# Patient Record
Sex: Male | Born: 1955 | Race: White | Hispanic: No | Marital: Married | Smoking: Current every day smoker
Health system: Southern US, Community
[De-identification: ages and names within clinical notes are randomized; demographics above are authoritative.]

## PROBLEM LIST (undated history)

## (undated) DIAGNOSIS — J449 Chronic obstructive pulmonary disease, unspecified: Secondary | ICD-10-CM

## (undated) DIAGNOSIS — R0602 Shortness of breath: Secondary | ICD-10-CM

## (undated) DIAGNOSIS — R51 Headache: Secondary | ICD-10-CM

## (undated) HISTORY — PX: BACK SURGERY: SHX140

---

## 1998-04-06 ENCOUNTER — Emergency Department (HOSPITAL_COMMUNITY): Admission: EM | Admit: 1998-04-06 | Discharge: 1998-04-06 | Payer: Self-pay

## 2002-08-11 ENCOUNTER — Encounter: Payer: Self-pay | Admitting: Emergency Medicine

## 2002-08-11 ENCOUNTER — Emergency Department (HOSPITAL_COMMUNITY): Admission: EM | Admit: 2002-08-11 | Discharge: 2002-08-11 | Payer: Self-pay | Admitting: Emergency Medicine

## 2005-03-11 ENCOUNTER — Emergency Department (HOSPITAL_COMMUNITY): Admission: EM | Admit: 2005-03-11 | Discharge: 2005-03-11 | Payer: Self-pay | Admitting: Emergency Medicine

## 2010-09-06 ENCOUNTER — Emergency Department (HOSPITAL_COMMUNITY)
Admission: EM | Admit: 2010-09-06 | Discharge: 2010-09-06 | Payer: Self-pay | Source: Home / Self Care | Admitting: Emergency Medicine

## 2010-10-25 ENCOUNTER — Ambulatory Visit (HOSPITAL_COMMUNITY)
Admission: RE | Admit: 2010-10-25 | Discharge: 2010-10-25 | Payer: Self-pay | Source: Home / Self Care | Attending: Family Medicine | Admitting: Family Medicine

## 2010-10-25 LAB — BLOOD GAS, ARTERIAL
Acid-Base Excess: 2.2 mmol/L — ABNORMAL HIGH (ref 0.0–2.0)
Bicarbonate: 25.9 mEq/L — ABNORMAL HIGH (ref 20.0–24.0)
FIO2: 0.21 %
O2 Saturation: 96.3 %
Patient temperature: 37
TCO2: 22.1 mmol/L (ref 0–100)
pCO2 arterial: 38 mmHg (ref 35.0–45.0)
pH, Arterial: 7.449 (ref 7.350–7.450)
pO2, Arterial: 73.1 mmHg — ABNORMAL LOW (ref 80.0–100.0)

## 2012-07-12 ENCOUNTER — Emergency Department (HOSPITAL_BASED_OUTPATIENT_CLINIC_OR_DEPARTMENT_OTHER)
Admission: EM | Admit: 2012-07-12 | Discharge: 2012-07-12 | Disposition: A | Discharge status: Home / Self Care | Payer: Managed Care, Other (non HMO) | Attending: Emergency Medicine | Admitting: Emergency Medicine

## 2012-07-12 ENCOUNTER — Emergency Department (HOSPITAL_BASED_OUTPATIENT_CLINIC_OR_DEPARTMENT_OTHER): Payer: Managed Care, Other (non HMO)

## 2012-07-12 ENCOUNTER — Encounter (HOSPITAL_BASED_OUTPATIENT_CLINIC_OR_DEPARTMENT_OTHER): Payer: Self-pay

## 2012-07-12 DIAGNOSIS — J440 Chronic obstructive pulmonary disease with acute lower respiratory infection: Secondary | ICD-10-CM | POA: Insufficient documentation

## 2012-07-12 DIAGNOSIS — J209 Acute bronchitis, unspecified: Secondary | ICD-10-CM | POA: Insufficient documentation

## 2012-07-12 DIAGNOSIS — R51 Headache: Secondary | ICD-10-CM | POA: Insufficient documentation

## 2012-07-12 DIAGNOSIS — F17200 Nicotine dependence, unspecified, uncomplicated: Secondary | ICD-10-CM | POA: Insufficient documentation

## 2012-07-12 DIAGNOSIS — J9801 Acute bronchospasm: Secondary | ICD-10-CM

## 2012-07-12 HISTORY — DX: Chronic obstructive pulmonary disease, unspecified: J44.9

## 2012-07-12 LAB — CBC WITH DIFFERENTIAL/PLATELET
Basophils Relative: 0 % (ref 0–1)
Eosinophils Absolute: 0.1 10*3/uL (ref 0.0–0.7)
HCT: 40.2 % (ref 39.0–52.0)
Hemoglobin: 14.2 g/dL (ref 13.0–17.0)
Lymphs Abs: 1.1 10*3/uL (ref 0.7–4.0)
MCH: 32.3 pg (ref 26.0–34.0)
MCHC: 35.3 g/dL (ref 30.0–36.0)
MCV: 91.6 fL (ref 78.0–100.0)
Monocytes Absolute: 1.1 10*3/uL — ABNORMAL HIGH (ref 0.1–1.0)
Monocytes Relative: 6 % (ref 3–12)
RBC: 4.39 MIL/uL (ref 4.22–5.81)

## 2012-07-12 LAB — BASIC METABOLIC PANEL
BUN: 15 mg/dL (ref 6–23)
Creatinine, Ser: 1.1 mg/dL (ref 0.50–1.35)
GFR calc Af Amer: 85 mL/min — ABNORMAL LOW (ref >90)
GFR calc non Af Amer: 73 mL/min — ABNORMAL LOW (ref >90)
Glucose, Bld: 116 mg/dL — ABNORMAL HIGH (ref 70–99)
Potassium: 3.9 mEq/L (ref 3.5–5.1)

## 2012-07-12 MED ORDER — AZITHROMYCIN 250 MG PO TABS: ORAL_TABLET | ORAL | Status: DC

## 2012-07-12 MED ORDER — AZITHROMYCIN 250 MG PO TABS
250.0000 mg | ORAL_TABLET | Freq: Once | ORAL | Status: AC
  Administered 2012-07-12: 250 mg via ORAL
  Filled 2012-07-12: qty 1

## 2012-07-12 MED ORDER — SODIUM CHLORIDE 0.9 % IV BOLUS (SEPSIS)
1000.0000 mL | Freq: Once | INTRAVENOUS | Status: AC
  Administered 2012-07-12: 1000 mL via INTRAVENOUS

## 2012-07-12 MED ORDER — ALBUTEROL SULFATE (5 MG/ML) 0.5% IN NEBU
5.0000 mg | INHALATION_SOLUTION | RESPIRATORY_TRACT | Status: DC
  Administered 2012-07-12: 5 mg via RESPIRATORY_TRACT

## 2012-07-12 MED ORDER — ALBUTEROL SULFATE (5 MG/ML) 0.5% IN NEBU
INHALATION_SOLUTION | RESPIRATORY_TRACT | Status: AC
  Filled 2012-07-12: qty 1

## 2012-07-12 MED ORDER — IPRATROPIUM BROMIDE 0.02 % IN SOLN
RESPIRATORY_TRACT | Status: AC
  Filled 2012-07-12: qty 2.5

## 2012-07-12 MED ORDER — IPRATROPIUM BROMIDE 0.02 % IN SOLN
0.5000 mg | RESPIRATORY_TRACT | Status: DC
  Administered 2012-07-12: 0.5 mg via RESPIRATORY_TRACT

## 2012-07-12 MED ORDER — IBUPROFEN 400 MG PO TABS
600.0000 mg | ORAL_TABLET | Freq: Once | ORAL | Status: AC
  Administered 2012-07-12: 600 mg via ORAL
  Filled 2012-07-12: qty 1

## 2012-07-12 MED ORDER — PREDNISONE 20 MG PO TABS: ORAL_TABLET | ORAL | Status: DC

## 2012-07-12 NOTE — ED Provider Notes (Signed)
History     CSN: [id number]  Arrival date & time 07/12/12  1314   First MD Initiated Contact with Patient 07/12/12 1334      Chief Complaint  Patient presents with  . Cough  . Chills  . Generalized Body Aches    (Consider location/radiation/quality/duration/timing/severity/associated sxs/prior treatment) HPI Comments: 56 year old male with COPD presents the emergency department complaining of productive cough, chills and body aches for the past 3 days. He states the symptoms subsided yesterday, however this morning when he woke up they came back. He feels very congested and believes he has a fever. Has not had any alleviating factors. Admits to associated shortness of breath she's been using his albuterol for. He is feeling slightly nauseated. Denies chest pain or vomiting. Denies any sick contacts. Smokes 2 packs per day.  Patient is a 56 y.o. male presenting with cough. The history is provided by the patient.  Cough Associated symptoms include chills, headaches, myalgias and shortness of breath. Pertinent negatives include no chest pain.    Past Medical History  Diagnosis Date  . COPD (chronic obstructive pulmonary disease)     Past Surgical History  Procedure Date  . Back surgery     No family history on file.  History  Substance Use Topics  . Smoking status: Current Every Day Smoker -- 2.0 packs/day    Types: Cigarettes  . Smokeless tobacco: Never Used  . Alcohol Use: No      Review of Systems  Constitutional: Positive for fever and chills.  HENT: Positive for congestion.   Respiratory: Positive for cough, chest tightness and shortness of breath.   Cardiovascular: Negative for chest pain.  Gastrointestinal: Positive for nausea. Negative for vomiting.  Genitourinary: Negative for flank pain.  Musculoskeletal: Positive for myalgias and arthralgias.  Neurological: Positive for headaches. Negative for dizziness.  Psychiatric/Behavioral: Negative for confusion.      Allergies  Review of patient's allergies indicates no known allergies.  Home Medications  No current outpatient prescriptions on file.  BP 136/64  Pulse 77  Temp 99.3 F (37.4 C) (Oral)  Resp 20  Ht 5\' 9"  (1.753 m)  Wt 155 lb (70.308 kg)  BMI 22.89 kg/m2  SpO2 98%  Physical Exam  Constitutional: He is oriented to person, place, and time. He appears well-developed and well-nourished. No distress.  HENT:  Head: Normocephalic and atraumatic.  Right Ear: Tympanic membrane, external ear and ear canal normal.  Left Ear: Tympanic membrane, external ear and ear canal normal.  Nose: Mucosal edema present. No rhinorrhea.  Mouth/Throat: Abnormal dentition. Posterior oropharyngeal erythema present. No oropharyngeal exudate or posterior oropharyngeal edema.       Postnasal drip noted.  Eyes: Conjunctivae normal and EOM are normal.  Neck: Normal range of motion. Neck supple.  Cardiovascular: Normal rate, regular rhythm, normal heart sounds and intact distal pulses.   Pulmonary/Chest: He has wheezes (Scattered). He has rhonchi (scattered, L>R).       Labored breathing  Abdominal: Soft. Normal appearance and bowel sounds are normal. There is tenderness in the epigastric area. There is no rigidity, no rebound, no guarding and no CVA tenderness.  Musculoskeletal: Normal range of motion.  Lymphadenopathy:    He has cervical adenopathy.  Neurological: He is alert and oriented to person, place, and time.  Skin: Skin is warm and intact. No rash noted.       Very warm  Psychiatric: He has a normal mood and affect. His speech is normal and behavior is normal.  ED Course  Procedures (including critical care time)   Labs Reviewed  CBC WITH DIFFERENTIAL  BASIC METABOLIC PANEL   Results for orders placed during the hospital encounter of 07/12/12  CBC WITH DIFFERENTIAL      Component Value Range   WBC 17.0 (*) 4.0 - 10.5 K/uL   RBC 4.39  4.22 - 5.81 MIL/uL   Hemoglobin 14.2  13.0 -  17.0 g/dL   HCT 13.0  86.5 - 78.4 %   MCV 91.6  78.0 - 100.0 fL   MCH 32.3  26.0 - 34.0 pg   MCHC 35.3  30.0 - 36.0 g/dL   RDW 69.6  29.5 - 28.4 %   Platelets 243  150 - 400 K/uL   Neutrophils Relative 87 (*) 43 - 77 %   Neutro Abs 14.8 (*) 1.7 - 7.7 K/uL   Lymphocytes Relative 6 (*) 12 - 46 %   Lymphs Abs 1.1  0.7 - 4.0 K/uL   Monocytes Relative 6  3 - 12 %   Monocytes Absolute 1.1 (*) 0.1 - 1.0 K/uL   Eosinophils Relative 1  0 - 5 %   Eosinophils Absolute 0.1  0.0 - 0.7 K/uL   Basophils Relative 0  0 - 1 %   Basophils Absolute 0.0  0.0 - 0.1 K/uL  BASIC METABOLIC PANEL      Component Value Range   Sodium 135  135 - 145 mEq/L   Potassium 3.9  3.5 - 5.1 mEq/L   Chloride 98  96 - 112 mEq/L   CO2 27  19 - 32 mEq/L   Glucose, Bld 116 (*) 70 - 99 mg/dL   BUN 15  6 - 23 mg/dL   Creatinine, Ser 1.32  0.50 - 1.35 mg/dL   Calcium 9.3  8.4 - 44.0 mg/dL   GFR calc non Af Amer 73 (*) >90 mL/min   GFR calc Af Amer 85 (*) >90 mL/min    Dg Chest 2 View  07/12/2012  *RADIOLOGY REPORT*  Clinical Data: Cough, chills.  CHEST - 2 VIEW  Comparison: 09/06/2010  Findings: Heart is normal size.  Left lung is clear. Nodular density projects over the right upper lobe.  This was not seen on prior study.  No effusions.  No acute bony abnormality.  IMPRESSION: No active cardiopulmonary disease.  Nodular density projecting over the right upper lobe.  Cannot exclude pulmonary nodule.  Recommend chest CT for further evaluation.   Original Report Authenticated By: Cyndie Chime, M.D.      1. Acute bronchitis   2. Acute bronchospasm       MDM  56 y/o male with COPD presenting with cough, body aches, fever, chills. White count elevated at 17. Lungs with scattered wheezes and ronchi on exam. Duo-neb given. Patient refusing rectal temp for more accurate temperature. 2:54 PM States he is feeling less sob after duo-neb. Still wheezing. CXR without any pneumonia, however small nodule seen in right lung.  Patient was not aware of this, and he will discuss it with his PCP Dr. Lorin Picket. Will treat for bronchitis. Case discussed with Dr. Rulon Abide who agrees with plan of care.        Trevor Mace, PA-C 07/12/12 1500

## 2012-07-12 NOTE — ED Notes (Signed)
Pt reports cough, chills and generalized body aches.

## 2012-07-12 NOTE — ED Provider Notes (Signed)
Medical screening examination/treatment/procedure(s) were performed by non-physician practitioner and as supervising physician I was immediately available for consultation/collaboration.  Jones Skene, M.D.      Jones Skene, MD 07/12/12 1654

## 2012-07-12 NOTE — ED Notes (Signed)
Pt refused rectal temp.

## 2012-12-11 ENCOUNTER — Other Ambulatory Visit (HOSPITAL_COMMUNITY): Payer: Self-pay

## 2012-12-11 DIAGNOSIS — J441 Chronic obstructive pulmonary disease with (acute) exacerbation: Secondary | ICD-10-CM

## 2012-12-24 ENCOUNTER — Ambulatory Visit (HOSPITAL_COMMUNITY)
Admission: RE | Admit: 2012-12-24 | Discharge: 2012-12-24 | Disposition: A | Discharge status: Home / Self Care | Payer: Managed Care, Other (non HMO) | Source: Ambulatory Visit | Attending: Family Medicine | Admitting: Family Medicine

## 2012-12-24 DIAGNOSIS — R0989 Other specified symptoms and signs involving the circulatory and respiratory systems: Secondary | ICD-10-CM | POA: Insufficient documentation

## 2012-12-24 DIAGNOSIS — R0609 Other forms of dyspnea: Secondary | ICD-10-CM | POA: Insufficient documentation

## 2012-12-24 DIAGNOSIS — J449 Chronic obstructive pulmonary disease, unspecified: Secondary | ICD-10-CM | POA: Insufficient documentation

## 2012-12-24 DIAGNOSIS — J4489 Other specified chronic obstructive pulmonary disease: Secondary | ICD-10-CM | POA: Insufficient documentation

## 2012-12-24 MED ORDER — ALBUTEROL SULFATE (5 MG/ML) 0.5% IN NEBU
2.5000 mg | INHALATION_SOLUTION | Freq: Once | RESPIRATORY_TRACT | Status: AC
  Administered 2012-12-24: 2.5 mg via RESPIRATORY_TRACT

## 2012-12-26 LAB — PULMONARY FUNCTION TEST

## 2012-12-26 NOTE — Procedures (Signed)
Jeffrey Krueger, [name]                 ACCOUNT NO.:  [id number]  MEDICAL RECORD NO.:  [id number]  LOCATION:  RESP                          FACILITY:  [facility]  PHYSICIAN:  Mikahla Wisor L. Juanetta Gosling, M.D.DATE OF BIRTH:  [birth year]  DATE OF PROCEDURE: DATE OF DISCHARGE:  12/24/2012                           PULMONARY FUNCTION TEST   REASON FOR PULMONARY FUNCTION TESTING:  COPD.  RESULTS: 1. Spirometry shows a severe ventilatory defect with airflow     obstruction. 2. There is significant bronchodilator improvement. 3. This study is consistent with clinical diagnosis of COPD.     Jeffrey Krueger L. Juanetta Gosling, M.D.     EL[facility]/MEDQ  D:  12/25/2012  T:  12/26/2012  Job:  [id number]

## 2013-01-31 ENCOUNTER — Other Ambulatory Visit: Payer: Self-pay | Admitting: Family Medicine

## 2013-01-31 DIAGNOSIS — J449 Chronic obstructive pulmonary disease, unspecified: Secondary | ICD-10-CM

## 2013-01-31 NOTE — Progress Notes (Signed)
  Subjective:    Patient ID: Jeffrey Krueger, male    DOB: 1956/02/01, 57 y.o.   MRN: [id number]  HPI    Review of Systems     Objective:   Physical Exam        Assessment & Plan:

## 2013-02-13 ENCOUNTER — Institutional Professional Consult (permissible substitution): Payer: Managed Care, Other (non HMO) | Admitting: Critical Care Medicine

## 2013-03-08 ENCOUNTER — Other Ambulatory Visit: Payer: Self-pay | Admitting: *Deleted

## 2013-03-08 DIAGNOSIS — R911 Solitary pulmonary nodule: Secondary | ICD-10-CM

## 2013-05-28 ENCOUNTER — Ambulatory Visit (INDEPENDENT_AMBULATORY_CARE_PROVIDER_SITE_OTHER): Payer: Managed Care, Other (non HMO) | Admitting: Family Medicine

## 2013-05-28 ENCOUNTER — Encounter: Payer: Self-pay | Admitting: Family Medicine

## 2013-05-28 VITALS — BP 102/70 | Temp 98.0°F | Ht 69.0 in | Wt 146.1 lb

## 2013-05-28 DIAGNOSIS — F17200 Nicotine dependence, unspecified, uncomplicated: Secondary | ICD-10-CM

## 2013-05-28 DIAGNOSIS — J441 Chronic obstructive pulmonary disease with (acute) exacerbation: Secondary | ICD-10-CM

## 2013-05-28 DIAGNOSIS — R911 Solitary pulmonary nodule: Secondary | ICD-10-CM

## 2013-05-28 DIAGNOSIS — Z720 Tobacco use: Secondary | ICD-10-CM

## 2013-05-28 DIAGNOSIS — Z125 Encounter for screening for malignant neoplasm of prostate: Secondary | ICD-10-CM

## 2013-05-28 DIAGNOSIS — Z79899 Other long term (current) drug therapy: Secondary | ICD-10-CM

## 2013-05-28 DIAGNOSIS — E785 Hyperlipidemia, unspecified: Secondary | ICD-10-CM

## 2013-05-28 MED ORDER — ETODOLAC 300 MG PO CAPS: 300.0000 mg | ORAL_CAPSULE | Freq: Three times a day (TID) | ORAL | Status: DC | PRN

## 2013-05-28 MED ORDER — ALBUTEROL SULFATE (2.5 MG/3ML) 0.083% IN NEBU: 2.5000 mg | INHALATION_SOLUTION | RESPIRATORY_TRACT | Status: DC | PRN

## 2013-05-28 MED ORDER — ALBUTEROL SULFATE HFA 108 (90 BASE) MCG/ACT IN AERS: 2.0000 | INHALATION_SPRAY | Freq: Four times a day (QID) | RESPIRATORY_TRACT | Status: DC | PRN

## 2013-05-28 MED ORDER — TIOTROPIUM BROMIDE MONOHYDRATE 18 MCG IN CAPS: 18.0000 ug | ORAL_CAPSULE | Freq: Every day | RESPIRATORY_TRACT | Status: DC

## 2013-05-28 MED ORDER — VALACYCLOVIR HCL 1 G PO TABS: ORAL_TABLET | ORAL | Status: AC

## 2013-05-28 NOTE — Progress Notes (Signed)
  Subjective:    Patient ID: Jeffrey Krueger, male    DOB: [birth year], 57 y.o.   MRN: [id number]  HPI Patient is being seen today because he thinks he has an outbreak of shingles on the lower part of his back. It started about a week ago. Causes some discomfort  He does have a history of a solitary pulmonary nodule for which he needs a followup CT scan. We have tried numerous times to get a hold of him including sending him a certified letter without any results he was talked to at length today and he agrees to having the test  Patient also complains of congestion and cough that has been going on for about 3 weeks now. Patient is mainly having a flareup of his COPD he ran out of his medications. No fever no mucoid drainage. No blood.  PMH COPD tobacco abuse Review of Systems Shortness of breath denies chest pain denies weight loss appetite good denies headaches depression    Objective:   Physical Exam Bilateral expiratory wheezes heart regular pulse normal abdomen soft extremities no edema       Assessment & Plan:  Wellness lab work ordered COPD refills on medications given hopefully patient will improve dramatically with restarting his medicines Solitary pulmonary nodule CT ordered. Await results Patient will followup again in 3 months time

## 2013-06-12 ENCOUNTER — Emergency Department (HOSPITAL_BASED_OUTPATIENT_CLINIC_OR_DEPARTMENT_OTHER)
Admission: EM | Admit: 2013-06-12 | Discharge: 2013-06-12 | Disposition: A | Discharge status: Home / Self Care | Payer: Managed Care, Other (non HMO) | Attending: Emergency Medicine | Admitting: Emergency Medicine

## 2013-06-12 ENCOUNTER — Emergency Department (HOSPITAL_BASED_OUTPATIENT_CLINIC_OR_DEPARTMENT_OTHER): Payer: Managed Care, Other (non HMO)

## 2013-06-12 ENCOUNTER — Encounter (HOSPITAL_BASED_OUTPATIENT_CLINIC_OR_DEPARTMENT_OTHER): Payer: Self-pay | Admitting: *Deleted

## 2013-06-12 DIAGNOSIS — J449 Chronic obstructive pulmonary disease, unspecified: Secondary | ICD-10-CM

## 2013-06-12 DIAGNOSIS — J441 Chronic obstructive pulmonary disease with (acute) exacerbation: Secondary | ICD-10-CM | POA: Insufficient documentation

## 2013-06-12 DIAGNOSIS — Z79899 Other long term (current) drug therapy: Secondary | ICD-10-CM | POA: Insufficient documentation

## 2013-06-12 DIAGNOSIS — R079 Chest pain, unspecified: Secondary | ICD-10-CM | POA: Insufficient documentation

## 2013-06-12 DIAGNOSIS — F17200 Nicotine dependence, unspecified, uncomplicated: Secondary | ICD-10-CM | POA: Insufficient documentation

## 2013-06-12 LAB — COMPREHENSIVE METABOLIC PANEL
ALT: 22 U/L (ref 0–53)
AST: 18 U/L (ref 0–37)
Albumin: 4 g/dL (ref 3.5–5.2)
Calcium: 10 mg/dL (ref 8.4–10.5)
GFR calc Af Amer: 84 mL/min — ABNORMAL LOW (ref >90)
Glucose, Bld: 89 mg/dL (ref 70–99)
Potassium: 4 mEq/L (ref 3.5–5.1)
Sodium: 139 mEq/L (ref 135–145)
Total Protein: 6.8 g/dL (ref 6.0–8.3)

## 2013-06-12 LAB — CBC WITH DIFFERENTIAL/PLATELET
Basophils Absolute: 0.1 10*3/uL (ref 0.0–0.1)
Basophils Relative: 0 % (ref 0–1)
Eosinophils Absolute: 0.9 10*3/uL — ABNORMAL HIGH (ref 0.0–0.7)
Eosinophils Relative: 6 % — ABNORMAL HIGH (ref 0–5)
MCH: 32.4 pg (ref 26.0–34.0)
MCHC: 34.6 g/dL (ref 30.0–36.0)
MCV: 93.5 fL (ref 78.0–100.0)
Neutrophils Relative %: 68 % (ref 43–77)
Platelets: 256 10*3/uL (ref 150–400)
RDW: 12.4 % (ref 11.5–15.5)

## 2013-06-12 MED ORDER — IPRATROPIUM BROMIDE 0.02 % IN SOLN
0.5000 mg | Freq: Once | RESPIRATORY_TRACT | Status: AC
  Administered 2013-06-12: 0.5 mg via RESPIRATORY_TRACT
  Filled 2013-06-12: qty 2.5

## 2013-06-12 MED ORDER — PREDNISONE 10 MG PO TABS: ORAL_TABLET | ORAL | Status: DC

## 2013-06-12 MED ORDER — ALBUTEROL SULFATE (5 MG/ML) 0.5% IN NEBU
5.0000 mg | INHALATION_SOLUTION | Freq: Once | RESPIRATORY_TRACT | Status: AC
  Administered 2013-06-12: 5 mg via RESPIRATORY_TRACT
  Filled 2013-06-12: qty 1

## 2013-06-12 MED ORDER — METHYLPREDNISOLONE SODIUM SUCC 125 MG IJ SOLR
125.0000 mg | Freq: Once | INTRAMUSCULAR | Status: AC
  Administered 2013-06-12: 125 mg via INTRAVENOUS
  Filled 2013-06-12: qty 2

## 2013-06-12 MED ORDER — AZITHROMYCIN 250 MG PO TABS: 250.0000 mg | ORAL_TABLET | Freq: Every day | ORAL | Status: DC

## 2013-06-12 NOTE — ED Provider Notes (Signed)
CSN: [id number]     Arrival date & time 06/12/13  1358 History   None    Chief Complaint  Patient presents with  . Shortness of Breath   (Consider location/radiation/quality/duration/timing/severity/associated sxs/prior Treatment) Patient is a 57 y.o. male presenting with shortness of breath. The history is provided by the patient. No language interpreter was used.  Shortness of Breath Severity:  Moderate Onset quality:  Gradual Duration:  1 month Timing:  Constant Progression:  Worsening Chronicity:  New Relieved by:  Nothing Worsened by:  Nothing tried Ineffective treatments:  None tried Associated symptoms: chest pain and cough   Risk factors comment:  Smoker   Past Medical History  Diagnosis Date  . COPD (chronic obstructive pulmonary disease)    Past Surgical History  Procedure Laterality Date  . Back surgery     No family history on file. History  Substance Use Topics  . Smoking status: Current Every Day Smoker -- 2.00 packs/day    Types: Cigarettes  . Smokeless tobacco: Never Used  . Alcohol Use: No    Review of Systems  Respiratory: Positive for cough and shortness of breath.   Cardiovascular: Positive for chest pain.  All other systems reviewed and are negative.    Allergies  Review of patient's allergies indicates no known allergies.  Home Medications   Current Outpatient Rx  Name  Route  Sig  Dispense  Refill  . albuterol (PROVENTIL HFA;VENTOLIN HFA) 108 (90 BASE) MCG/ACT inhaler   Inhalation   Inhale 2 puffs into the lungs every 6 (six) hours as needed for wheezing.   1 Inhaler   6   . albuterol (PROVENTIL) (2.5 MG/3ML) 0.083% nebulizer solution   Nebulization   Take 3 mLs (2.5 mg total) by nebulization every 4 (four) hours as needed for wheezing.   150 mL   12     Please give 2 boxes of the vials   . etodolac (LODINE) 300 MG capsule   Oral   Take 1 capsule (300 mg total) by mouth 3 (three) times daily as needed.   45 capsule   6    . tiotropium (SPIRIVA HANDIHALER) 18 MCG inhalation capsule   Inhalation   Place 1 capsule (18 mcg total) into inhaler and inhale daily.   30 capsule   12    BP 139/101  Temp(Src) 98.2 F (36.8 C) (Oral)  Resp 39  Ht 5\' 9"  (1.753 m)  Wt 148 lb (67.132 kg)  BMI 21.85 kg/m2  SpO2 96% Physical Exam  Nursing note and vitals reviewed. Constitutional: He appears well-developed and well-nourished.  HENT:  Head: Normocephalic.  Right Ear: External ear normal.  Left Ear: External ear normal.  Nose: Nose normal.  Mouth/Throat: Oropharynx is clear and moist.  Eyes: Conjunctivae and EOM are normal. Pupils are equal, round, and reactive to light.  Neck: Normal range of motion. Neck supple.  Cardiovascular: Normal rate.   Pulmonary/Chest: He has wheezes.  Abdominal: Soft.  Musculoskeletal: Normal range of motion.  Neurological: He is alert.  Skin: Skin is warm.  Psychiatric: He has a normal mood and affect.    ED Course  Procedures (including critical care time) Labs Review Labs Reviewed - No data to display Imaging Review Dg Chest 2 View  06/12/2013   *RADIOLOGY REPORT*  Clinical Data: Cough and shortness of breath, history of smoking, initial encounter.  CHEST - 2 VIEW  Comparison: 07/12/2012; 09/06/2010  Findings: Grossly unchanged cardiac silhouette and mediastinal contours with prominence of the  central pulmonary vasculature.  The lungs remain hyperexpanded with flattening of the hemidiaphragms and mild diffuse slightly nodular thickening of the interstitium. An approximately 7 mm nodular opacity overlying the peripheral aspect of the right upper lung appears unchanged to minimally increased interval.  Symmetric opacities overlying the bilateral lower lungs are favored to represent nipple shadows.  No focal airspace opacities.  No pleural effusion or pneumothorax.  No evidence of edema.  Unchanged bones.  IMPRESSION: 1.  Approximately 7 mm nodular opacity overlying the peripheral  aspect of the right mid lung appears unchanged to minimally increased in size.  Given marked emphysematous change, further evaluation with chest CT is recommended. 2.  Additional grossly symmetric nodular opacities overlying the bilateral mid lungs are favored to represent nipple shadows. Again, attention on recommended chest CT is recommended. 3.  Marked hyperexpansion without definite acute cardiopulmonary disease.  This was made a call report.   Original Report Authenticated By: Tacey Ruiz, MD   Ct Chest Wo Contrast  06/12/2013   *RADIOLOGY REPORT*  Clinical Data: Cough, possible pulmonary nodules.  CT CHEST WITHOUT CONTRAST  Technique:  Multidetector CT imaging of the chest was performed following the standard protocol without IV contrast.  Comparison: Chest radiograph of same date.  Findings: Hyperexpansion of the lungs is noted.  No evidence of mediastinal mass or adenopathy is noted.  Visualized portion of upper abdomen appears normal.  5 mm nodule is noted in the anterior and lateral portion of the right upper lobe.  Faint irregular densities are seen in the the lateral costophrenic sulcus bilaterally most consistent with scarring.  No pneumonia or significant atelectasis is noted.  No pneumothorax or pleural effusion is noted.  IMPRESSION: Hyperexpansion of the lungs is noted suggesting chronic obstructive pulmonary disease.  5 mm nodule is noted anteriorly in the If the patient is at high risk for bronchogenic carcinoma, follow-up chest CT at 6-12 months is recommended.  If the patient is at low risk for bronchogenic carcinoma, follow-up chest CT at 12 months is recommended.  This recommendation follows the consensus statement: Guidelines for Management of Small Pulmonary Nodules Detected on CT Scans: A Statement from the Fleischner Society as published in Radiology 2005; 237:395-400.   right upper lobe;   Original Report Authenticated By: Lupita Raider.,  M.D.    MDM   1. COPD (chronic  obstructive pulmonary disease)    Chest xray shows nodule.  Radiologist recommends Ct scan.   Pt given albuterol and Atrovent neb.  Pt given solumedrol IV.   Pt counseled on Ct.  I advised him to see Dr. Gerda Diss for recheck in 1 week.   Pt needs follow up on nodule and copd,   Pt advised to stop smoking.  Pt given rx for prednisone taper and zithromax    Elson Areas, PA-C 06/12/13 1950

## 2013-06-12 NOTE — ED Notes (Signed)
PA at bedside discussing test results and plan of care for DC.

## 2013-06-12 NOTE — ED Notes (Signed)
Patient has hx of copd.  States for the last one month, he has been sob. States his PCP scheduled him for a CT scan, but did not have the co pay money so he cancelled it.  Productive cough with small amounts of yellow secretions.

## 2013-06-13 NOTE — ED Provider Notes (Signed)
Medical screening examination/treatment/procedure(s) were performed by non-physician practitioner and as supervising physician I was immediately available for consultation/collaboration.   Malu Pellegrini, MD 06/13/13 0729 

## 2013-06-18 ENCOUNTER — Ambulatory Visit (INDEPENDENT_AMBULATORY_CARE_PROVIDER_SITE_OTHER): Payer: Managed Care, Other (non HMO) | Admitting: Family Medicine

## 2013-06-18 ENCOUNTER — Encounter: Payer: Self-pay | Admitting: Family Medicine

## 2013-06-18 VITALS — BP 126/70 | Ht 69.0 in | Wt 146.0 lb

## 2013-06-18 DIAGNOSIS — F17200 Nicotine dependence, unspecified, uncomplicated: Secondary | ICD-10-CM

## 2013-06-18 DIAGNOSIS — J441 Chronic obstructive pulmonary disease with (acute) exacerbation: Secondary | ICD-10-CM

## 2013-06-18 DIAGNOSIS — R911 Solitary pulmonary nodule: Secondary | ICD-10-CM

## 2013-06-18 DIAGNOSIS — Z720 Tobacco use: Secondary | ICD-10-CM

## 2013-06-18 LAB — LIPID PANEL
Cholesterol: 152 mg/dL (ref 0–200)
HDL: 55 mg/dL (ref >39)
LDL Cholesterol: 79 mg/dL (ref 0–99)
Triglycerides: 89 mg/dL (ref <150)

## 2013-06-18 LAB — BASIC METABOLIC PANEL
BUN: 17 mg/dL (ref 6–23)
CO2: 35 mEq/L — ABNORMAL HIGH (ref 19–32)
Calcium: 9.7 mg/dL (ref 8.4–10.5)
Chloride: 101 mEq/L (ref 96–112)
Creat: 1.17 mg/dL (ref 0.50–1.35)

## 2013-06-18 MED ORDER — METHYLPREDNISOLONE ACETATE 40 MG/ML IJ SUSP
40.0000 mg | Freq: Once | INTRAMUSCULAR | Status: AC
  Administered 2013-06-18: 40 mg via INTRAMUSCULAR

## 2013-06-18 MED ORDER — BUDESONIDE-FORMOTEROL FUMARATE 160-4.5 MCG/ACT IN AERO: 2.0000 | INHALATION_SPRAY | Freq: Two times a day (BID) | RESPIRATORY_TRACT | Status: DC

## 2013-06-18 MED ORDER — PREDNISONE 20 MG PO TABS: ORAL_TABLET | ORAL | Status: DC

## 2013-06-18 NOTE — Progress Notes (Signed)
  Subjective:    Patient ID: Jeffrey Krueger, male    DOB: April 08, 1956, 57 y.o.   MRN: [id number]  HPI Patient is here today for a ER follow up visit for COPD exacerbation. Patient was seen at a [facility] facility on last Friday. He has completed his medications that he was prescribed. Patient states that xrays were done also at the facility.  25-30 minutes was spent with patient discussing importance of quitting smoking the importance of treating COPD and treatment options. Questions were answered in detail regarding medications medication combinations and referral processes. Patient questions were also answered regarding pulmonary nodule PMH severe COPD tobacco abuse pulmonary nodule Family history noncontributory Review of Systems    patient with dyspnea with minimal activity. Coughs up clear phlegm not bloody. Tries to eat properly has difficult time maintaining his weight. Objective:   Physical Exam His lungs show some intermittent bilateral wheezing patient does appear slightly short of breath but he states that this is his baseline his heart is regular abdomen soft extremities no edema skin warm dry neurologic grossly normal.       Assessment & Plan:  #1 COPD/reactive airway-continue albuterol frequent basis, Symbicort twice daily, spur Riva daily, recommend referral to pulmonology at [facility] one day he may qualify for lung transplant. Pulmonary function tests were done in April I do not feel he needs an additional 1 #2 pulmonary nodule repeat CT scan in 6 months time #3 patient was encouraged to quit smoking he will try nicotine patches. Significant time spent discussing this. 25-30 minutes spent with patient

## 2013-06-19 ENCOUNTER — Encounter: Payer: Self-pay | Admitting: Family Medicine

## 2013-06-30 ENCOUNTER — Telehealth: Payer: Self-pay | Admitting: Family Medicine

## 2013-06-30 MED ORDER — ALPRAZOLAM 1 MG PO TABS: ORAL_TABLET | ORAL | Status: DC

## 2013-06-30 MED ORDER — PREDNISONE 20 MG PO TABS: ORAL_TABLET | ORAL | Status: DC

## 2013-06-30 NOTE — Telephone Encounter (Signed)
Medications was sent to pharmacy. Camelia Eng was notified.

## 2013-06-30 NOTE — Telephone Encounter (Signed)
Nurse to call, clarify what the patient needs. Certainly if needing a prednisone taper we can do that. Patient has severe COPD. Xanax use is reasonable. Please discuss with patient what he needs then we can call in

## 2013-06-30 NOTE — Telephone Encounter (Signed)
Refill on Xanax x2, prednisone taper 20 mg. 3 tablets daily for 2 days, then 2 tablets daily for 2 days, then one tablet daily for 2 days. That is 12 tablets total, followup if ongoing troubles

## 2013-06-30 NOTE — Telephone Encounter (Signed)
Patient needs Rx for prednisone and xanax     [facility]

## 2013-06-30 NOTE — Telephone Encounter (Signed)
Patient called to check on this - I told him that we were going to call in for patient, but nurse had not had the time to finish yet. Would like a call back when called in.

## 2013-06-30 NOTE — Telephone Encounter (Signed)
Jeffrey Krueger states that patient has SOB and prednisone helps. Needs refill on xanax.

## 2013-07-23 ENCOUNTER — Telehealth: Payer: Self-pay | Admitting: Family Medicine

## 2013-07-23 NOTE — Telephone Encounter (Signed)
Patient needs refill for prednisone   [facility]

## 2013-07-23 NOTE — Telephone Encounter (Signed)
May refill once. 

## 2013-07-23 NOTE — Telephone Encounter (Signed)
Last filled 06/30/13. 

## 2013-07-24 ENCOUNTER — Encounter: Payer: Self-pay | Admitting: Family Medicine

## 2013-07-24 MED ORDER — PREDNISONE 20 MG PO TABS: ORAL_TABLET | ORAL | Status: DC

## 2013-07-24 NOTE — Telephone Encounter (Signed)
Rx sent electronically to [facility] [place]

## 2013-09-05 ENCOUNTER — Ambulatory Visit (INDEPENDENT_AMBULATORY_CARE_PROVIDER_SITE_OTHER): Payer: Managed Care, Other (non HMO) | Admitting: Family Medicine

## 2013-09-05 ENCOUNTER — Encounter: Payer: Self-pay | Admitting: Family Medicine

## 2013-09-05 ENCOUNTER — Other Ambulatory Visit: Payer: Self-pay | Admitting: *Deleted

## 2013-09-05 VITALS — BP 128/80 | Temp 98.1°F | Ht 69.0 in | Wt 152.0 lb

## 2013-09-05 DIAGNOSIS — Z23 Encounter for immunization: Secondary | ICD-10-CM

## 2013-09-05 DIAGNOSIS — M5431 Sciatica, right side: Secondary | ICD-10-CM | POA: Insufficient documentation

## 2013-09-05 DIAGNOSIS — J441 Chronic obstructive pulmonary disease with (acute) exacerbation: Secondary | ICD-10-CM

## 2013-09-05 DIAGNOSIS — M5430 Sciatica, unspecified side: Secondary | ICD-10-CM

## 2013-09-05 MED ORDER — ALPRAZOLAM 1 MG PO TABS: ORAL_TABLET | ORAL | Status: DC

## 2013-09-05 MED ORDER — HYDROCODONE-ACETAMINOPHEN 5-325 MG PO TABS: 1.0000 | ORAL_TABLET | Freq: Four times a day (QID) | ORAL | Status: DC | PRN

## 2013-09-05 NOTE — Progress Notes (Signed)
   Subjective:    Patient ID: Jeffrey Krueger, male    DOB: 09-07-1956, 57 y.o.   MRN: [id number]  HPIFollow up on COPD. No concerns. Declines flu vaccine. Needs refill on xanax and inhaler.  Discuss letter he received about symbicort. COPD fair but he gets short of breath with Maisie Fus everything he does he still smokes a pack a day he's been counseled that he truly needs to quit this altogether patient understands the importance of quitting. He states he will try harder in quitting.  Patient has anxiety issues he uses Xanax sparingly to help with this. PMH COPD, pulmonary nodule  Cough for 1 week. Requesting rx for prednisone. Upon further discussion he states he uses a prednisone for body aches I told him that that was inappropriate it would be better to try hydrocodone when necessary.  Review of Systems  Constitutional: Negative for fever and activity change.  HENT: Negative for congestion, ear pain and rhinorrhea.   Eyes: Negative for discharge.  Respiratory: Positive for cough. Negative for wheezing.   Cardiovascular: Negative for chest pain.       Objective:   Physical Exam  Nursing note and vitals reviewed. Constitutional: He appears well-developed.  HENT:  Head: Normocephalic.  Mouth/Throat: Oropharynx is clear and moist. No oropharyngeal exudate.  Neck: Normal range of motion.  Cardiovascular: Normal rate, regular rhythm and normal heart sounds.   No murmur heard. Pulmonary/Chest: Effort normal and breath sounds normal. He has no wheezes.  Lymphadenopathy:    He has no cervical adenopathy.  Neurological: He exhibits normal muscle tone.  Skin: Skin is warm and dry.          Assessment & Plan:  COPD-patient needs insurance approval for his Symbicort. We will try to help get this. It along with Spiriva is very important to get his condition under control. He uses albuterol as a rescue inhaler patient was encouraged for prednisone to be used infrequently  Heart beat in  the ear-she does not having severe headaches with this it is not unilateral. He is a thin person. I think he is just hearing the transmission of his heartbeat I don't feel he has any aneurysm. He gets occasional headaches but no vomiting.  R leg sciatica- hydrocodne-patient has intermittent sciatica on the right side for which he uses hydrocodone infrequently.  Patient does state he gets anxious a lot and uses Xanax for this he denies abusing it.  Pneumonia vaccine today  Will need CT exam from March or April

## 2013-09-20 ENCOUNTER — Encounter (HOSPITAL_BASED_OUTPATIENT_CLINIC_OR_DEPARTMENT_OTHER): Payer: Self-pay | Admitting: Emergency Medicine

## 2013-09-20 ENCOUNTER — Inpatient Hospital Stay (HOSPITAL_BASED_OUTPATIENT_CLINIC_OR_DEPARTMENT_OTHER)
Admission: EM | Admit: 2013-09-20 | Discharge: 2013-09-22 | DRG: 192 | Disposition: A | Discharge status: Home / Self Care | Payer: Managed Care, Other (non HMO) | Attending: Family Medicine | Admitting: Family Medicine

## 2013-09-20 ENCOUNTER — Emergency Department (HOSPITAL_BASED_OUTPATIENT_CLINIC_OR_DEPARTMENT_OTHER): Payer: Managed Care, Other (non HMO)

## 2013-09-20 DIAGNOSIS — Z720 Tobacco use: Secondary | ICD-10-CM | POA: Diagnosis present

## 2013-09-20 DIAGNOSIS — J209 Acute bronchitis, unspecified: Principal | ICD-10-CM | POA: Diagnosis present

## 2013-09-20 DIAGNOSIS — IMO0001 Reserved for inherently not codable concepts without codable children: Secondary | ICD-10-CM

## 2013-09-20 DIAGNOSIS — J440 Chronic obstructive pulmonary disease with acute lower respiratory infection: Principal | ICD-10-CM | POA: Diagnosis present

## 2013-09-20 DIAGNOSIS — J111 Influenza due to unidentified influenza virus with other respiratory manifestations: Secondary | ICD-10-CM | POA: Diagnosis present

## 2013-09-20 DIAGNOSIS — J449 Chronic obstructive pulmonary disease, unspecified: Secondary | ICD-10-CM | POA: Diagnosis present

## 2013-09-20 DIAGNOSIS — R0603 Acute respiratory distress: Secondary | ICD-10-CM

## 2013-09-20 DIAGNOSIS — B349 Viral infection, unspecified: Secondary | ICD-10-CM

## 2013-09-20 DIAGNOSIS — F17200 Nicotine dependence, unspecified, uncomplicated: Secondary | ICD-10-CM | POA: Diagnosis present

## 2013-09-20 DIAGNOSIS — J441 Chronic obstructive pulmonary disease with (acute) exacerbation: Secondary | ICD-10-CM | POA: Diagnosis present

## 2013-09-20 HISTORY — DX: Headache: R51

## 2013-09-20 HISTORY — DX: Shortness of breath: R06.02

## 2013-09-20 LAB — CBC WITH DIFFERENTIAL/PLATELET
Basophils Absolute: 0 10*3/uL (ref 0.0–0.1)
Basophils Relative: 0 % (ref 0–1)
Eosinophils Absolute: 0.1 10*3/uL (ref 0.0–0.7)
Eosinophils Relative: 1 % (ref 0–5)
HCT: 44.3 % (ref 39.0–52.0)
Hemoglobin: 14.9 g/dL (ref 13.0–17.0)
Lymphocytes Relative: 11 % — ABNORMAL LOW (ref 12–46)
Lymphs Abs: 0.9 10*3/uL (ref 0.7–4.0)
MCH: 32.5 pg (ref 26.0–34.0)
MCHC: 33.6 g/dL (ref 30.0–36.0)
MCV: 96.7 fL (ref 78.0–100.0)
Monocytes Absolute: 1.2 10*3/uL — ABNORMAL HIGH (ref 0.1–1.0)
Monocytes Relative: 14 % — ABNORMAL HIGH (ref 3–12)
Neutro Abs: 6.4 K/uL (ref 1.7–7.7)
Neutrophils Relative %: 75 % (ref 43–77)
Platelets: 209 10*3/uL (ref 150–400)
RBC: 4.58 MIL/uL (ref 4.22–5.81)
RDW: 12.4 % (ref 11.5–15.5)
WBC: 8.6 K/uL (ref 4.0–10.5)

## 2013-09-20 LAB — BASIC METABOLIC PANEL
Calcium: 9.1 mg/dL (ref 8.4–10.5)
Creatinine, Ser: 1.1 mg/dL (ref 0.50–1.35)
GFR calc Af Amer: 84 mL/min — ABNORMAL LOW (ref >90)
GFR calc non Af Amer: 73 mL/min — ABNORMAL LOW (ref >90)
Glucose, Bld: 143 mg/dL — ABNORMAL HIGH (ref 70–99)
Sodium: 140 mEq/L (ref 135–145)

## 2013-09-20 LAB — TROPONIN I: Troponin I: <0.3 ng/mL (ref <0.30)

## 2013-09-20 LAB — BASIC METABOLIC PANEL WITH GFR
BUN: 23 mg/dL (ref 6–23)
CO2: 26 meq/L (ref 19–32)
Chloride: 101 meq/L (ref 96–112)
Potassium: 3.9 meq/L (ref 3.5–5.1)

## 2013-09-20 MED ORDER — MORPHINE SULFATE 4 MG/ML IJ SOLN
4.0000 mg | Freq: Once | INTRAMUSCULAR | Status: AC
  Administered 2013-09-20: 4 mg via INTRAVENOUS
  Filled 2013-09-20: qty 1

## 2013-09-20 MED ORDER — ALBUTEROL SULFATE (5 MG/ML) 0.5% IN NEBU
5.0000 mg | INHALATION_SOLUTION | Freq: Once | RESPIRATORY_TRACT | Status: AC
  Administered 2013-09-20: 5 mg via RESPIRATORY_TRACT

## 2013-09-20 MED ORDER — ALBUTEROL SULFATE (5 MG/ML) 0.5% IN NEBU
2.5000 mg | INHALATION_SOLUTION | RESPIRATORY_TRACT | Status: DC
  Filled 2013-09-20: qty 0.5

## 2013-09-20 MED ORDER — METHYLPREDNISOLONE SODIUM SUCC 125 MG IJ SOLR
125.0000 mg | Freq: Once | INTRAMUSCULAR | Status: AC
  Administered 2013-09-20: 125 mg via INTRAVENOUS
  Filled 2013-09-20: qty 2

## 2013-09-20 MED ORDER — IPRATROPIUM BROMIDE 0.02 % IN SOLN
RESPIRATORY_TRACT | Status: AC
  Administered 2013-09-20: 0.5 mg via RESPIRATORY_TRACT
  Filled 2013-09-20: qty 2.5

## 2013-09-20 MED ORDER — ALBUTEROL (5 MG/ML) CONTINUOUS INHALATION SOLN
INHALATION_SOLUTION | RESPIRATORY_TRACT | Status: AC
  Administered 2013-09-20: 5 mg via RESPIRATORY_TRACT
  Filled 2013-09-20: qty 20

## 2013-09-20 MED ORDER — ALBUTEROL (5 MG/ML) CONTINUOUS INHALATION SOLN
10.0000 mg/h | INHALATION_SOLUTION | Freq: Once | RESPIRATORY_TRACT | Status: AC
  Administered 2013-09-20: 10 mg/h via RESPIRATORY_TRACT
  Filled 2013-09-20: qty 20

## 2013-09-20 MED ORDER — LORAZEPAM 1 MG PO TABS
1.0000 mg | ORAL_TABLET | Freq: Once | ORAL | Status: AC
  Administered 2013-09-20: 1 mg via ORAL
  Filled 2013-09-20: qty 1

## 2013-09-20 MED ORDER — IPRATROPIUM BROMIDE 0.02 % IN SOLN
0.5000 mg | Freq: Once | RESPIRATORY_TRACT | Status: AC
  Administered 2013-09-20: 0.5 mg via RESPIRATORY_TRACT

## 2013-09-20 MED ORDER — ONDANSETRON HCL 4 MG/2ML IJ SOLN
4.0000 mg | Freq: Once | INTRAMUSCULAR | Status: AC
  Administered 2013-09-20: 4 mg via INTRAVENOUS
  Filled 2013-09-20: qty 2

## 2013-09-20 MED ORDER — SODIUM CHLORIDE 0.9 % IV SOLN: INTRAVENOUS | Status: DC

## 2013-09-20 MED ORDER — FENTANYL CITRATE 0.05 MG/ML IJ SOLN
50.0000 ug | Freq: Once | INTRAMUSCULAR | Status: AC
  Administered 2013-09-20: 50 ug via INTRAVENOUS
  Filled 2013-09-20: qty 2

## 2013-09-20 NOTE — ED Notes (Signed)
Taken directly to room 5 from registration. RT and primary RN at bedside

## 2013-09-20 NOTE — ED Provider Notes (Signed)
CSN: [id number]     Arrival date & time 09/20/13  1751 History  This chart was scribed for Gavin Pound. Oletta Lamas, MD by Dorothey Baseman, ED Scribe. This patient was seen in room MH05/MH05 and the patient's care was started at 6:23 PM.    Chief Complaint  Patient presents with  . Shortness of Breath   The history is provided by the patient. No language interpreter was used.   HPI Comments: Jeffrey Krueger is a 57 y.o. Male with a history of COPD who presents to the Emergency Department complaining of shortness of breath with associated wheezes and chest tightness, described as a cramping sensation, onset PTA. He reports some associated subjective fever, mild headache, chills, and diffuse myalgias for the past 2 days. He reports taking ibuprofen at home without significant relief. Patient was put on oxygen upon arrival to the ED, but states that he is not on at-home oxygen. He reports that he has recently been exposed to sick contacts at home. Patient reports that he received a pneumonia vaccination this year. Patient is a current, every day smoker, 2 PPD. Patient has no other pertinent medical history.   PCP- Dr. Lilyan Punt  Past Medical History  Diagnosis Date  . COPD (chronic obstructive pulmonary disease)    Past Surgical History  Procedure Laterality Date  . Back surgery     History reviewed. No pertinent family history. History  Substance Use Topics  . Smoking status: Current Every Day Smoker -- 2.00 packs/day    Types: Cigarettes  . Smokeless tobacco: Never Used  . Alcohol Use: No    Review of Systems  A complete 10 system review of systems was obtained and all systems are negative except as noted in the HPI and PMH.   Allergies  Review of patient's allergies indicates no known allergies.  Home Medications   Current Outpatient Rx  Name  Route  Sig  Dispense  Refill  . albuterol (PROVENTIL HFA;VENTOLIN HFA) 108 (90 BASE) MCG/ACT inhaler   Inhalation   Inhale 2 puffs into the  lungs every 6 (six) hours as needed for wheezing.   1 Inhaler   6   . albuterol (PROVENTIL) (2.5 MG/3ML) 0.083% nebulizer solution   Nebulization   Take 3 mLs (2.5 mg total) by nebulization every 4 (four) hours as needed for wheezing.   150 mL   12     Please give 2 boxes of the vials   . ALPRAZolam (XANAX) 1 MG tablet      Take 1/2-1 tablet qhs prn   30 tablet   3   . ALPRAZolam (XANAX) 1 MG tablet      Take 1/2-1 tablet qhs prn   30 tablet   3   . budesonide-formoterol (SYMBICORT) 160-4.5 MCG/ACT inhaler   Inhalation   Inhale 2 puffs into the lungs 2 (two) times daily.   1 Inhaler   12   . HYDROcodone-acetaminophen (NORCO/VICODIN) 5-325 MG per tablet   Oral   Take 1 tablet by mouth every 6 (six) hours as needed.   30 tablet   0     Use sparingly   . tiotropium (SPIRIVA HANDIHALER) 18 MCG inhalation capsule   Inhalation   Place 1 capsule (18 mcg total) into inhaler and inhale daily.   30 capsule   12    Triage Vitals: Pulse 123  Resp 24  SpO2 99%  Physical Exam  Nursing note and vitals reviewed. Constitutional: He is oriented to person, place,  and time. He appears well-developed and well-nourished. No distress.  HENT:  Head: Normocephalic and atraumatic.  Eyes: Conjunctivae are normal.  Neck: Normal range of motion. Neck supple.  Cardiovascular: Normal rate, regular rhythm, normal heart sounds and intact distal pulses.   No murmur heard. Pulmonary/Chest: He is in respiratory distress. He has wheezes.  Labored respirations. Tachypneic. Decreased air movement on the right. Diffuse expiratory wheezes on the left. Accessory muscle use.   Abdominal: He exhibits no distension.  Musculoskeletal: Normal range of motion.  Neurological: He is alert and oriented to person, place, and time.  Skin: Skin is warm and dry.  Psychiatric: He has a normal mood and affect. His behavior is normal.    ED Course  Procedures (including critical care time)   CRITICAL  CARE Performed by: Lear Ng. Total critical care time: 30 min Critical care time was exclusive of separately billable procedures and treating other patients. Critical care was necessary to treat or prevent imminent or life-threatening deterioration. Critical care was time spent personally by me on the following activities: development of treatment plan with patient and/or surrogate as well as nursing, discussions with consultants, evaluation of patient's response to treatment, examination of patient, obtaining history from patient or surrogate, ordering and performing treatments and interventions, ordering and review of laboratory studies, ordering and review of radiographic studies, pulse oximetry and re-evaluation of patient's condition.   DIAGNOSTIC STUDIES: Oxygen Saturation is 99% on nasal cannulation, normal by my interpretation.    COORDINATION OF CARE: 6:25 PM- Ordered Proventil/Atrovent nebulizer. Will order a chest x-ray to rule out pneumonia. Will order blood labs. Will order Solu-medrol, an additional nebulizer treatment, and Sublimaze to manage symptoms. Discussed that the patient may need to be admitted if his symptoms do not improve after treatment. Discussed treatment plan with patient at bedside and patient verbalized agreement.   7:13 PM- Patient reports that his symptoms have somewhat improved since receiving the medications. Patient is less tachypneic.  Discussed treatment plan with patient at bedside and patient verbalized agreement.   8:40 PM- Patient reports that his breathing feels better, but that the cramping sensation in his abdomen has persisted even after receiving the medications. Discussed that x-ray and lab results were normal. Less work of breathing and expiratory wheezes are much more diffuse at this time. Will order morphine to better manage his pain symptoms. Will walk the patient and monitor his pulse ox when he is not on oxygen. Discussed treatment plan  with patient at bedside and patient verbalized agreement.   9:11 PM- Patient reports feeling much better after receiving the morphine, but was unable to stand up without becoming extremely short of breath when he was taking off of the oxygen. Discussed that the patient will be admitted to the hospital. Discussed treatment plan with patient at bedside and patient verbalized agreement.   Labs Review Labs Reviewed  CBC WITH DIFFERENTIAL - Abnormal; Notable for the following:    Lymphocytes Relative 11 (*)    Monocytes Relative 14 (*)    Monocytes Absolute 1.2 (*)    All other components within normal limits  BASIC METABOLIC PANEL - Abnormal; Notable for the following:    Glucose, Bld 143 (*)    GFR calc non Af Amer 73 (*)    GFR calc Af Amer 84 (*)    All other components within normal limits  INFLUENZA PANEL BY PCR   Imaging Review Dg Chest Portable 1 View  09/20/2013   CLINICAL DATA:  Shortness  of breath associated with wheezing and chest tightness, cramping sensation, fever, headache, chills, diffuse myalgias, history smoking, COPD  EXAM: PORTABLE CHEST - 1 VIEW  COMPARISON:  Portable exam at 1813 hr compared to 06/12/2013  FINDINGS: Insert base 3  Lungs appear emphysematous but clear.  No pleural effusion or pneumothorax.  Nodular density in right upper lobe is unchanged.  Faint nodular density lateral left lung base is grossly unchanged since an earlier study of 07/12/2012.  No acute osseous findings.  IMPRESSION: COPD changes with stable nodular foci bilaterally.  No acute abnormalities.   Electronically Signed   By: Ulyses Southward M.D.   On: 09/20/2013 18:42    EKG Interpretation   At t ime 18:55 ECG shows sinus tachycardia with occasional PVC's, non specific ST abn's, artifact in leads V3, V4.  Normal axis.  Abn ECG.       MDM   1. Respiratory distress   2. COPD with bronchitis      I personally performed the services described in this documentation, which was scribed in my  presence. The recorded information has been reviewed and considered.   Pt with COPD excerbation, probably URI involvement, no infiltrate on CXR.  CP I think is tightness associated with air trapping and COPD.  Will need several nebs in the ED, I doubt pt will improe enough to be discharged to home.  Doubt this is ACS.  Will give IV pain meds as well for chest tightness and pain.      Gavin Pound. Oletta Lamas, MD 09/20/13 2117

## 2013-09-20 NOTE — ED Notes (Signed)
Attempted to get patient off of stretcher to ambulate - as soon as patient stood up without oxygen on he became suddenly extremely short of breath - reports he is unable to ambulate - HR 140 - 87% Pulse Ox on RA - pt settled = HR down to 130, O2 @ 2lpm = 96% pulse ox. EDP aware.

## 2013-09-20 NOTE — Progress Notes (Signed)
TRANSFER FROM [facility] ED to [facility] Telemetry Bed  57 year old male with COPD Exacerbation and Acute Bronchitis, URI Sxs x 2 days and wheezing.  On 2 liters [place] O2 sats 95-96%.

## 2013-09-20 NOTE — ED Notes (Signed)
Hx of COPD- SOB x 2 days- audible wheezing- respirations labored upon arrival

## 2013-09-20 NOTE — ED Notes (Signed)
Dr. Ghim at bedside. 

## 2013-09-21 ENCOUNTER — Encounter (HOSPITAL_COMMUNITY): Payer: Self-pay | Admitting: Internal Medicine

## 2013-09-21 DIAGNOSIS — R0989 Other specified symptoms and signs involving the circulatory and respiratory systems: Secondary | ICD-10-CM

## 2013-09-21 DIAGNOSIS — R0603 Acute respiratory distress: Secondary | ICD-10-CM

## 2013-09-21 DIAGNOSIS — J441 Chronic obstructive pulmonary disease with (acute) exacerbation: Secondary | ICD-10-CM

## 2013-09-21 DIAGNOSIS — R0609 Other forms of dyspnea: Secondary | ICD-10-CM

## 2013-09-21 DIAGNOSIS — F17200 Nicotine dependence, unspecified, uncomplicated: Secondary | ICD-10-CM

## 2013-09-21 LAB — INFLUENZA PANEL BY PCR (TYPE A & B)
H1N1 flu by pcr: NOT DETECTED
Influenza A By PCR: POSITIVE — AB
Influenza B By PCR: NEGATIVE

## 2013-09-21 MED ORDER — SODIUM CHLORIDE 0.9 % IJ SOLN
3.0000 mL | Freq: Two times a day (BID) | INTRAMUSCULAR | Status: DC
  Administered 2013-09-21 – 2013-09-22 (×3): 3 mL via INTRAVENOUS

## 2013-09-21 MED ORDER — ALBUTEROL SULFATE (5 MG/ML) 0.5% IN NEBU
2.5000 mg | INHALATION_SOLUTION | Freq: Two times a day (BID) | RESPIRATORY_TRACT | Status: DC
  Administered 2013-09-22: 2.5 mg via RESPIRATORY_TRACT
  Filled 2013-09-21: qty 0.5

## 2013-09-21 MED ORDER — DOCUSATE SODIUM 100 MG PO CAPS
100.0000 mg | ORAL_CAPSULE | Freq: Two times a day (BID) | ORAL | Status: DC
  Administered 2013-09-21: 100 mg via ORAL
  Filled 2013-09-21 (×5): qty 1

## 2013-09-21 MED ORDER — MORPHINE SULFATE 2 MG/ML IJ SOLN: 2.0000 mg | INTRAMUSCULAR | Status: DC | PRN

## 2013-09-21 MED ORDER — BUDESONIDE-FORMOTEROL FUMARATE 160-4.5 MCG/ACT IN AERO
2.0000 | INHALATION_SPRAY | Freq: Two times a day (BID) | RESPIRATORY_TRACT | Status: DC
  Administered 2013-09-21 – 2013-09-22 (×3): 2 via RESPIRATORY_TRACT
  Filled 2013-09-21: qty 6

## 2013-09-21 MED ORDER — SODIUM CHLORIDE 0.9 % IV SOLN
INTRAVENOUS | Status: DC
  Administered 2013-09-21: 02:00:00 via INTRAVENOUS

## 2013-09-21 MED ORDER — LEVOFLOXACIN IN D5W 750 MG/150ML IV SOLN
750.0000 mg | INTRAVENOUS | Status: DC
  Administered 2013-09-21 – 2013-09-22 (×2): 750 mg via INTRAVENOUS
  Filled 2013-09-21 (×2): qty 150

## 2013-09-21 MED ORDER — IPRATROPIUM BROMIDE 0.02 % IN SOLN
0.5000 mg | Freq: Two times a day (BID) | RESPIRATORY_TRACT | Status: DC
  Administered 2013-09-22: 0.5 mg via RESPIRATORY_TRACT
  Filled 2013-09-21: qty 2.5

## 2013-09-21 MED ORDER — IPRATROPIUM BROMIDE 0.02 % IN SOLN
0.5000 mg | RESPIRATORY_TRACT | Status: DC
  Administered 2013-09-21 (×3): 0.5 mg via RESPIRATORY_TRACT
  Filled 2013-09-21 (×3): qty 2.5

## 2013-09-21 MED ORDER — ALPRAZOLAM 0.5 MG PO TABS
1.0000 mg | ORAL_TABLET | Freq: Three times a day (TID) | ORAL | Status: DC | PRN
  Administered 2013-09-21: 1 mg via ORAL
  Filled 2013-09-21: qty 2

## 2013-09-21 MED ORDER — OSELTAMIVIR PHOSPHATE 75 MG PO CAPS
75.0000 mg | ORAL_CAPSULE | Freq: Two times a day (BID) | ORAL | Status: DC
  Administered 2013-09-21 – 2013-09-22 (×3): 75 mg via ORAL
  Filled 2013-09-21 (×4): qty 1

## 2013-09-21 MED ORDER — ALBUTEROL SULFATE (5 MG/ML) 0.5% IN NEBU
2.5000 mg | INHALATION_SOLUTION | RESPIRATORY_TRACT | Status: DC
  Administered 2013-09-21 (×3): 2.5 mg via RESPIRATORY_TRACT
  Filled 2013-09-21 (×3): qty 0.5

## 2013-09-21 MED ORDER — ONDANSETRON HCL 4 MG PO TABS: 4.0000 mg | ORAL_TABLET | Freq: Four times a day (QID) | ORAL | Status: DC | PRN

## 2013-09-21 MED ORDER — ALBUTEROL SULFATE (5 MG/ML) 0.5% IN NEBU
2.5000 mg | INHALATION_SOLUTION | Freq: Four times a day (QID) | RESPIRATORY_TRACT | Status: DC
  Administered 2013-09-21 (×2): 2.5 mg via RESPIRATORY_TRACT
  Filled 2013-09-21 (×2): qty 0.5

## 2013-09-21 MED ORDER — NICOTINE 21 MG/24HR TD PT24
21.0000 mg | MEDICATED_PATCH | Freq: Every day | TRANSDERMAL | Status: DC
  Administered 2013-09-21 – 2013-09-22 (×2): 21 mg via TRANSDERMAL
  Filled 2013-09-21 (×2): qty 1

## 2013-09-21 MED ORDER — METHYLPREDNISOLONE SODIUM SUCC 40 MG IJ SOLR
40.0000 mg | Freq: Four times a day (QID) | INTRAMUSCULAR | Status: DC
  Administered 2013-09-21 – 2013-09-22 (×6): 40 mg via INTRAVENOUS
  Filled 2013-09-21 (×10): qty 1

## 2013-09-21 MED ORDER — TIOTROPIUM BROMIDE MONOHYDRATE 18 MCG IN CAPS
18.0000 ug | ORAL_CAPSULE | Freq: Every day | RESPIRATORY_TRACT | Status: DC
  Administered 2013-09-21: 18 ug via RESPIRATORY_TRACT
  Filled 2013-09-21: qty 5

## 2013-09-21 MED ORDER — ALBUTEROL SULFATE (5 MG/ML) 0.5% IN NEBU: 2.5000 mg | INHALATION_SOLUTION | RESPIRATORY_TRACT | Status: DC | PRN

## 2013-09-21 MED ORDER — ONDANSETRON HCL 4 MG/2ML IJ SOLN: 4.0000 mg | Freq: Four times a day (QID) | INTRAMUSCULAR | Status: DC | PRN

## 2013-09-21 MED ORDER — HYDROCODONE-ACETAMINOPHEN 5-325 MG PO TABS: 1.0000 | ORAL_TABLET | Freq: Four times a day (QID) | ORAL | Status: DC | PRN

## 2013-09-21 NOTE — H&P (Signed)
[facility] History and Physical  Jeffrey Krueger JJO:[id number] DOB: 02/23/1956    PCP:   Lilyan Punt, MD   Chief Complaint: shortness of breath for 4 days.  HPI: Jeffrey Krueger is an 57 y.o. male with hx of COPD, heavy and longtime tobacco abuse (2ppd), presents to [facility] with 4 days hx of myalgia, shortness of breath and wheezing.  He has had no fever or chills, distant travel or ill contact.  Evalution in the ER included a chest xray which did not show any infiltrate, but known pulmonary nodule, normal WBC Hb and renal Fx test.  He was given IV steroids, neb, and still has significant wheezing, so hospitalist was asked to admit him for COPD exacerbation and possibly a viral syndrome.  Rewiew of Systems:  Constitutional: Negative for malaise, fever and chills. No significant weight loss or weight gain Eyes: Negative for eye pain, redness and discharge, diplopia, visual changes, or flashes of light. ENMT: Negative for ear pain, hoarseness, nasal congestion, sinus pressure and sore throat. No headaches; tinnitus, drooling, or problem swallowing. Cardiovascular: Negative for chest pain, palpitations, diaphoresis, dyspnea and peripheral edema. ; No orthopnea, PND Respiratory: No pleuritic chestpain. Gastrointestinal: Negative for nausea, vomiting, diarrhea, constipation, abdominal pain, melena, blood in stool, hematemesis, jaundice and rectal bleeding.    Genitourinary: Negative for frequency, dysuria, incontinence,flank pain and hematuria; Musculoskeletal: Negative for back pain and neck pain. Negative for swelling and trauma.;  Skin: . Negative for pruritus, rash, abrasions, bruising and skin lesion.; ulcerations Neuro: Negative for headache, lightheadedness and neck stiffness. Negative for weakness, altered level of consciousness , altered mental status, extremity weakness, burning feet, involuntary movement, seizure and syncope.  Psych: negative for anxiety, depression, insomnia,  tearfulness, panic attacks, hallucinations, paranoia, suicidal or homicidal ideation    Past Medical History  Diagnosis Date  . COPD (chronic obstructive pulmonary disease)   . Shortness of breath   . ZSWFUXNA(355.7)     Past Surgical History  Procedure Laterality Date  . Back surgery      Medications:  HOME MEDS: Prior to Admission medications   Medication Sig Start Date End Date Taking? Authorizing Provider  albuterol (PROVENTIL HFA;VENTOLIN HFA) 108 (90 BASE) MCG/ACT inhaler Inhale 2 puffs into the lungs every 6 (six) hours as needed for wheezing. 05/28/13   Babs Sciara, MD  albuterol (PROVENTIL) (2.5 MG/3ML) 0.083% nebulizer solution Take 3 mLs (2.5 mg total) by nebulization every 4 (four) hours as needed for wheezing. 05/28/13   Babs Sciara, MD  ALPRAZolam Prudy Feeler) 1 MG tablet Take 1/2-1 tablet qhs prn 09/05/13   Babs Sciara, MD  ALPRAZolam Prudy Feeler) 1 MG tablet Take 1/2-1 tablet qhs prn 09/05/13   Babs Sciara, MD  budesonide-formoterol (SYMBICORT) 160-4.5 MCG/ACT inhaler Inhale 2 puffs into the lungs 2 (two) times daily. 06/18/13   Babs Sciara, MD  HYDROcodone-acetaminophen (NORCO/VICODIN) 5-325 MG per tablet Take 1 tablet by mouth every 6 (six) hours as needed. 09/05/13   Babs Sciara, MD  tiotropium (SPIRIVA HANDIHALER) 18 MCG inhalation capsule Place 1 capsule (18 mcg total) into inhaler and inhale daily. 05/28/13 05/28/14  Babs Sciara, MD     Allergies:  No Known Allergies  Social History:   reports that he has been smoking Cigarettes.  He has been smoking about 2.00 packs per day. He has never used smokeless tobacco. He reports that he does not drink alcohol or use illicit drugs.  Family History: History reviewed. No pertinent family history.  Physical Exam: Filed Vitals:   09/20/13 1955 09/20/13 2221 09/20/13 2324 09/21/13 0154  BP:  131/70 115/63   Pulse:  110 116   Temp: 99.1 F (37.3 C)  98.7 F (37.1 C)   TempSrc: Oral  Oral   Resp:  24 18    Height:   5\' 9"  (1.753 m)   Weight:   65 kg (143 lb 4.8 oz)   SpO2:   97% 95%   Blood pressure 115/63, pulse 116, temperature 98.7 F (37.1 C), temperature source Oral, resp. rate 18, height 5\' 9"  (1.753 m), weight 65 kg (143 lb 4.8 oz), SpO2 95.00%.  GEN:  Pleasant  patient lying in the stretcher in no acute distress; cooperative with exam. PSYCH:  alert and oriented x4; does not appear anxious or depressed; affect is appropriate. HEENT: Mucous membranes pink and anicteric; PERRLA; EOM intact; no cervical lymphadenopathy nor thyromegaly or carotid bruit; no JVD; There were no stridor. Neck is very supple. Breasts:: Not examined CHEST WALL: No tenderness CHEST: Normal respiration, tight inspiratory and expiratory wheezing.  No rales. HEART: Regular rate and rhythm.  There are no murmur, rub, or gallops.   BACK: No kyphosis or scoliosis; no CVA tenderness ABDOMEN: soft and non-tender; no masses, no organomegaly, normal abdominal bowel sounds; no pannus; no intertriginous candida. There is no rebound and no distention. Rectal Exam: Not done EXTREMITIES: No bone or joint deformity; age-appropriate arthropathy of the hands and knees; no edema; no ulcerations.  There is no calf tenderness. Genitalia: not examined PULSES: 2+ and symmetric SKIN: Normal hydration no rash or ulceration CNS: Cranial nerves 2-12 grossly intact no focal lateralizing neurologic deficit.  Speech is fluent; uvula elevated with phonation, facial symmetry and tongue midline. DTR are normal bilaterally, cerebella exam is intact, barbinski is negative and strengths are equaled bilaterally.  No sensory loss.   Labs on Admission:  Basic Metabolic Panel:  Recent Labs Lab 09/20/13 1805  NA 140  K 3.9  CL 101  CO2 26  GLUCOSE 143*  BUN 23  CREATININE 1.10  CALCIUM 9.1   Liver Function Tests: No results found for this basename: AST, ALT, ALKPHOS, BILITOT, PROT, ALBUMIN,  in the last 168 hours No results found for  this basename: LIPASE, AMYLASE,  in the last 168 hours No results found for this basename: AMMONIA,  in the last 168 hours CBC:  Recent Labs Lab 09/20/13 1805  WBC 8.6  NEUTROABS 6.4  HGB 14.9  HCT 44.3  MCV 96.7  PLT 209   Cardiac Enzymes:  Recent Labs Lab 09/20/13 2212  TROPONINI <0.30    CBG: No results found for this basename: GLUCAP,  in the last 168 hours   Radiological Exams on Admission: Dg Chest Portable 1 View  09/20/2013   CLINICAL DATA:  Shortness of breath associated with wheezing and chest tightness, cramping sensation, fever, headache, chills, diffuse myalgias, history smoking, COPD  EXAM: PORTABLE CHEST - 1 VIEW  COMPARISON:  Portable exam at 1813 hr compared to 06/12/2013  FINDINGS: Insert base 3  Lungs appear emphysematous but clear.  No pleural effusion or pneumothorax.  Nodular density in right upper lobe is unchanged.  Faint nodular density lateral left lung base is grossly unchanged since an earlier study of 07/12/2012.  No acute osseous findings.  IMPRESSION: COPD changes with stable nodular foci bilaterally.  No acute abnormalities.   Electronically Signed   By: Ulyses Southward M.D.   On: 09/20/2013 18:42   Assessment/Plan Present on  Admission:  . COPD with respiratory distress, acute . Tobacco abuse . COPD exacerbation . Viral syndrome  PLAN:  Will admit him for COPD exacerbation, moderate.   Will give IV steroids, nebs, IVF, and place on precaution pending influenza PCR.  He is really out of the window for Tamiflu to be helpful, so I didn't give him any.  Will give supplemental oxygen as needed.  He was recommended to quit cigarettes.  Other plans as per orders.  Code Status: FULL Unk Lightning, MD. [facility] Pager [phone] 7pm to 7am.  09/21/2013, 2:38 AM

## 2013-09-21 NOTE — Progress Notes (Signed)
Subjective: Patient seen and examined, admitted this morning with URI. Influenza PCR is positive. Still short of breath on exertion. Filed Vitals:   09/21/13 1431  BP:   Pulse:   Temp: 97.7 F (36.5 C)  Resp:     Chest: B/l wheezing Heart : S1S2 RRR Abdomen: Soft, nontender Ext : No edema Neuro: Alert, oriented x 3  A/P Influenza COPD exacerbation  Start Tamiflu, and continue solumedrol and Duoneb nebulizer. Will give the nicotine patch.   Meredeth Ide [facility] [phone]

## 2013-09-22 ENCOUNTER — Ambulatory Visit: Payer: Managed Care, Other (non HMO) | Admitting: Family Medicine

## 2013-09-22 DIAGNOSIS — B9789 Other viral agents as the cause of diseases classified elsewhere: Secondary | ICD-10-CM

## 2013-09-22 MED ORDER — PREDNISONE 10 MG PO TABS: ORAL_TABLET | ORAL | Status: DC

## 2013-09-22 MED ORDER — LEVOFLOXACIN 750 MG PO TABS: 750.0000 mg | ORAL_TABLET | Freq: Every day | ORAL | Status: DC

## 2013-09-22 MED ORDER — OSELTAMIVIR PHOSPHATE 75 MG PO CAPS: 75.0000 mg | ORAL_CAPSULE | Freq: Two times a day (BID) | ORAL | Status: DC

## 2013-09-22 MED ORDER — GUAIFENESIN ER 600 MG PO TB12
600.0000 mg | ORAL_TABLET | Freq: Two times a day (BID) | ORAL | Status: DC
  Administered 2013-09-22: 600 mg via ORAL
  Filled 2013-09-22 (×2): qty 1

## 2013-09-22 MED ORDER — GUAIFENESIN ER 600 MG PO TB12: 600.0000 mg | ORAL_TABLET | Freq: Two times a day (BID) | ORAL | Status: DC

## 2013-09-22 MED FILL — Albuterol Sulfate Soln Nebu 0.5% (5 MG/ML): RESPIRATORY_TRACT | Qty: 0.5 | Status: AC

## 2013-09-22 NOTE — Progress Notes (Signed)
DC orders received.  Patient stable with no S/S of distress.  Medication and discharge information reviewed with patient.  Patient awaiting home oxygen to be delivered and then will be discharged. [place], Mitzi Hansen

## 2013-09-22 NOTE — Progress Notes (Signed)
SATURATION QUALIFICATIONS: (This note is used to comply with regulatory documentation for home oxygen)  Patient Saturations on Room Air at Rest = 89%  Patient Saturations on Room Air while Ambulating = 80%  Patient Saturations on 2 Liters of oxygen while Ambulating = 91-92%  Please briefly explain why patient needs home oxygen: Patient ambulated in hallway off O2 and sats dropped to 80%.  2L of O2 applied and saturation improved to 92% while ambulating.  Patient 96% on 2L while resting. [place], Mitzi Hansen

## 2013-09-22 NOTE — Care Management Note (Signed)
    Page 1 of 1   09/22/2013     11:19:46 AM   CARE MANAGEMENT NOTE 09/22/2013  Patient:  Jeffrey Krueger, Jeffrey Krueger   Account Number:  [id number]  Date Initiated:  09/22/2013  Documentation initiated by:  GRAVES-BIGELOW,Josiah Wojtaszek  Subjective/Objective Assessment:   Pt admitted for COPD exacerbation. Plan for home today with 02.     Action/Plan:   CM did speak to pt in reference to DME and pt chose [facility] for DME. CM did fax paprerwork and the 02 tank will be delivered to hospital within the next 2 hours. No further needs from CM at this time.   Anticipated DC Date:  09/22/2013   Anticipated DC Plan:  HOME/SELF CARE      DC Planning Services  CM consult      PAC Choice  DURABLE MEDICAL EQUIPMENT   Choice offered to / List presented to:  C-1 Patient   DME arranged  [facility]      DME agency  [facility]        Status of service:  Completed, signed off Medicare Important Message given?   (If response is "NO", the following Medicare IM given date fields will be blank) Date Medicare IM given:   Date Additional Medicare IM given:    Discharge Disposition:  HOME/SELF CARE  Per UR Regulation:  Reviewed for med. necessity/level of care/duration of stay  If discussed at Long Length of Stay Meetings, dates discussed:    Comments:

## 2013-09-22 NOTE — Progress Notes (Signed)
UR Completed Kiona Blume Graves-Bigelow, RN,BSN [phone]  

## 2013-09-22 NOTE — Discharge Summary (Signed)
Physician Discharge Summary  Jeffrey Krueger:[id number] DOB: 05-Mar-1956 DOA: 09/20/2013  PCP: Lilyan Punt, MD  Admit date: 09/20/2013 Discharge date: 09/22/2013  Time spent: 50* minutes  Recommendations for Outpatient Follow-up:  1. Follow up PCP in 2 weeks  Discharge Diagnoses:  Principal Problem:   COPD with respiratory distress, acute Active Problems:   COPD exacerbation   Tobacco abuse   Respiratory distress   Viral syndrome  Influenza  Discharge Condition: Stable  Diet recommendation: Regular diet  Filed Weights   09/20/13 2324  Weight: 65 kg (143 lb 4.8 oz)    History of present illness:  57 y.o. male with hx of COPD, heavy and longtime tobacco abuse (2ppd), presents to [facility] with 4 days hx of myalgia, shortness of breath and wheezing. He has had no fever or chills, distant travel or ill contact. Evalution in the ER included a chest xray which did not show any infiltrate, but known pulmonary nodule, normal WBC Hb and renal Fx test. He was given IV steroids, neb, and still has significant wheezing, so hospitalist was asked to admit him for COPD exacerbation and possibly a viral syndrome.   Hospital Course:  COPD exacerbation patient has history of COPD, longtime tobacco abuse and was admitted with worsening shortness of breath. Patient was started on IV Solu-Medrol and DuoNeb nebulizers. At this time patient is breathing much better, we'll send the patient home on home oxygen as his oxygen saturation dropped to less than 88 on walking which came back right up to 95% when he was started on 2 L of oxygen via nasal cannula. Patient will be sent home on oxygen and prednisone taper along with Levaquin 750 mg by mouth daily for 5 days.  Influenza A Patient had influenza A PCR positive, he was started on Tamiflu 75 mg by mouth twice a day. We'll send him home on Tamiflu for 4 more days.  Tobacco abuse- patient has been counseled for tobacco  abuse  Procedures:  None  Consultations:  None  Discharge Exam: Filed Vitals:   09/22/13 0500  BP: 117/76  Pulse: 95  Temp: 97.8 F (36.6 C)  Resp: 18    General: Appears in no acute distress S1-S2 is regular Cardiovascular: S1-S2 regular Respiratory: Clear to auscultation bilaterally  Discharge Instructions     Medication List         albuterol (2.5 MG/3ML) 0.083% nebulizer solution  Commonly known as:  PROVENTIL  Take 2.5 mg by nebulization every 6 (six) hours as needed for wheezing or shortness of breath.     albuterol 108 (90 BASE) MCG/ACT inhaler  Commonly known as:  PROVENTIL HFA;VENTOLIN HFA  Inhale 1-2 puffs into the lungs every 6 (six) hours as needed for wheezing or shortness of breath.     ALPRAZolam 1 MG tablet  Commonly known as:  XANAX  Take 1 mg by mouth at bedtime as needed for anxiety.     budesonide-formoterol 160-4.5 MCG/ACT inhaler  Commonly known as:  SYMBICORT  Inhale 2 puffs into the lungs 2 (two) times daily.     guaiFENesin 600 MG 12 hr tablet  Commonly known as:  MUCINEX  Take 1 tablet (600 mg total) by mouth 2 (two) times daily.     HYDROcodone-acetaminophen 5-325 MG per tablet  Commonly known as:  NORCO/VICODIN  Take 1 tablet by mouth every 6 (six) hours as needed for moderate pain.     ibuprofen 200 MG tablet  Commonly known as:  ADVIL,MOTRIN  Take 400  mg by mouth every 8 (eight) hours as needed for headache or mild pain.     levofloxacin 750 MG tablet  Commonly known as:  LEVAQUIN  Take 1 tablet (750 mg total) by mouth daily.     oseltamivir 75 MG capsule  Commonly known as:  TAMIFLU  Take 1 capsule (75 mg total) by mouth 2 (two) times daily.     predniSONE 10 MG tablet  Commonly known as:  DELTASONE  - Prednisone 40 mg po daily x 1 day then  - Prednisone 30 mg po daily x 1 day then  - Prednisone 20 mg po daily x 1 day then  - Prednisone 10 mg daily x 1 day then stop...     tiotropium 18 MCG inhalation capsule   Commonly known as:  SPIRIVA  Place 18 mcg into inhaler and inhale daily.       No Known Allergies    The results of significant diagnostics from this hospitalization (including imaging, microbiology, ancillary and laboratory) are listed below for reference.    Significant Diagnostic Studies: Dg Chest Portable 1 View  09/20/2013   CLINICAL DATA:  Shortness of breath associated with wheezing and chest tightness, cramping sensation, fever, headache, chills, diffuse myalgias, history smoking, COPD  EXAM: PORTABLE CHEST - 1 VIEW  COMPARISON:  Portable exam at 1813 hr compared to 06/12/2013  FINDINGS: Insert base 3  Lungs appear emphysematous but clear.  No pleural effusion or pneumothorax.  Nodular density in right upper lobe is unchanged.  Faint nodular density lateral left lung base is grossly unchanged since an earlier study of 07/12/2012.  No acute osseous findings.  IMPRESSION: COPD changes with stable nodular foci bilaterally.  No acute abnormalities.   Electronically Signed   By: Ulyses Southward M.D.   On: 09/20/2013 18:42    Microbiology: No results found for this or any previous visit (from the past 240 hour(s)).   Labs: Basic Metabolic Panel:  Recent Labs Lab 09/20/13 1805  NA 140  K 3.9  CL 101  CO2 26  GLUCOSE 143*  BUN 23  CREATININE 1.10  CALCIUM 9.1   Liver Function Tests: No results found for this basename: AST, ALT, ALKPHOS, BILITOT, PROT, ALBUMIN,  in the last 168 hours No results found for this basename: LIPASE, AMYLASE,  in the last 168 hours No results found for this basename: AMMONIA,  in the last 168 hours CBC:  Recent Labs Lab 09/20/13 1805  WBC 8.6  NEUTROABS 6.4  HGB 14.9  HCT 44.3  MCV 96.7  PLT 209   Cardiac Enzymes:  Recent Labs Lab 09/20/13 2212  TROPONINI <0.30   BNP: BNP (last 3 results) No results found for this basename: PROBNP,  in the last 8760 hours CBG: No results found for this basename: GLUCAP,  in the last 168  hours     Signed:  Ember Gottwald S  [facility] 09/22/2013, 10:25 AM

## 2013-10-02 ENCOUNTER — Encounter: Payer: Self-pay | Admitting: Family Medicine

## 2013-10-02 ENCOUNTER — Ambulatory Visit (INDEPENDENT_AMBULATORY_CARE_PROVIDER_SITE_OTHER): Payer: Managed Care, Other (non HMO) | Admitting: Family Medicine

## 2013-10-02 VITALS — BP 132/70 | Temp 98.3°F | Ht 69.0 in | Wt 148.0 lb

## 2013-10-02 DIAGNOSIS — J441 Chronic obstructive pulmonary disease with (acute) exacerbation: Secondary | ICD-10-CM

## 2013-10-02 DIAGNOSIS — G4734 Idiopathic sleep related nonobstructive alveolar hypoventilation: Secondary | ICD-10-CM | POA: Insufficient documentation

## 2013-10-02 DIAGNOSIS — R0902 Hypoxemia: Secondary | ICD-10-CM

## 2013-10-02 MED ORDER — BUDESONIDE-FORMOTEROL FUMARATE 160-4.5 MCG/ACT IN AERO: 2.0000 | INHALATION_SPRAY | Freq: Two times a day (BID) | RESPIRATORY_TRACT | Status: DC

## 2013-10-02 NOTE — Progress Notes (Signed)
   Subjective:    Patient ID: Jeffrey Krueger, male    DOB: 06/19/1956, 58 y.o.   MRN: [id number]  HPIHere for a follow up from hospital stay for COPD. Pt would like to see if he can get portable oxygen. Patient states the tanks they brought him at home are too large.   Having headaches. Patient thinks it may be from the oxygen. He states occasionally gets headaches at nighttime in the evening they do not wake him up at night no nausea or vomiting with  Would like to get rx for nicotine transdermal 21 mg patch 24 hour patch.   Needs refill on symbicort. He states his current canister froze up and will not work PMH COPD  Review of Systems Shortness of breath with activity occasional cough no hemoptysis no wheezing difficulty breathing no nausea vomiting diarrhea recently in the hospital those notes were reviewed    Objective:   Physical Exam  Lungs clear hearts regular pulse normal he is very thin has a typical COPD at appearance skin normal      Assessment & Plan:  #1 the importance of quitting smoking was discussed. Nicoderm patches prescriptions were given for 21 mg followed by 14 followed by 7 #2 exertional hypoxia and nocturnal hypoxia-patient had O2 sats in the low 80s while in the hospital with walking he was sent home on oxygen. He would like to have more portable oxygen tanks he gets short of breath with activity. He has severe COPD. Currently he has oxygen tanks that he wheels around which works for home but does not work when he goes out. I told him that we would try to get it but it was partly depend on his insurance company #3 severe COPD patient high risk complications I told him in the future he needs to get a flu shot. He deferred on that this past year he will keep up his standard medicines and followup again in 3-4 months

## 2013-10-22 ENCOUNTER — Telehealth: Payer: Self-pay | Admitting: Family Medicine

## 2013-10-22 NOTE — Telephone Encounter (Signed)
Order faxed to [facility] for pt's OCD (oxygen conserving device)

## 2013-10-24 ENCOUNTER — Telehealth: Payer: Self-pay | Admitting: Family Medicine

## 2013-10-24 NOTE — Telephone Encounter (Signed)
I don't blame him, if he did have problems the only way to leave would be [facility]. More than likely would be ok but can't gaurentee. [place] con[place] [address] [place] safer for [employer]

## 2013-10-24 NOTE — Telephone Encounter (Signed)
Discussed with patient

## 2013-10-24 NOTE — Telephone Encounter (Signed)
I rec. Wellbutrin SR 150 one bid , #60 2 refills, should help, same as Zyban but should be covered

## 2013-10-24 NOTE — Telephone Encounter (Signed)
Patient needs something to help him quit smoking. Their insurance wont pay for chantix or patches. Patients wife would like for Dr Lorin PicketScott to look into zyban. She said the reviews off of the Internet are great.

## 2013-10-24 NOTE — Telephone Encounter (Signed)
Patients spouse is planning a 5 [place] cruise to [place]. Jeffrey ClicheBobby is a little scared about this because he is afraid due to him being on oxygen, he does not want something to happen and him not be close to home. Jeffrey Krueger wants to know if you think this would be okay if he went?

## 2013-10-27 MED ORDER — BUPROPION HCL ER (SR) 150 MG PO TB12: 150.0000 mg | ORAL_TABLET | Freq: Two times a day (BID) | ORAL | Status: DC

## 2013-10-27 NOTE — Telephone Encounter (Signed)
Med send and patient notified

## 2013-11-06 ENCOUNTER — Telehealth: Payer: Self-pay | Admitting: Family Medicine

## 2013-11-06 NOTE — Telephone Encounter (Signed)
Patient was wanting to find out how to file for disability and I told them to call the social security office and they could help them with the process and they would request our records.

## 2013-11-06 NOTE — Telephone Encounter (Signed)
Patients wife wants Dr Lorin PicketScott to give her a call regarding patients long term disability paperwork that she was told he would start.

## 2013-11-06 NOTE — Telephone Encounter (Signed)
Please, nurses, called the patient's wife talked to her find out what this she needs to rest to do we will try to help as best we can. Left him know that I specifically asked the nurses to be my right hand person on this issue.(I cannot personally handle these type of phone calls directly do to time limitations but I am certainly willing to help once we find out what needs to be done thanks)

## 2013-11-14 ENCOUNTER — Telehealth: Payer: Self-pay | Admitting: Family Medicine

## 2013-11-14 NOTE — Telephone Encounter (Signed)
[facility] admitted [facility]

## 2013-11-14 NOTE — Telephone Encounter (Signed)
So noted , would look forward to taking care of the patient upon discharge

## 2013-11-24 ENCOUNTER — Encounter: Payer: Self-pay | Admitting: Family Medicine

## 2013-11-24 ENCOUNTER — Ambulatory Visit (INDEPENDENT_AMBULATORY_CARE_PROVIDER_SITE_OTHER): Payer: Managed Care, Other (non HMO) | Admitting: Family Medicine

## 2013-11-24 VITALS — BP 134/88 | Ht 69.0 in | Wt 153.4 lb

## 2013-11-24 DIAGNOSIS — J441 Chronic obstructive pulmonary disease with (acute) exacerbation: Secondary | ICD-10-CM

## 2013-11-24 DIAGNOSIS — R911 Solitary pulmonary nodule: Secondary | ICD-10-CM

## 2013-11-24 MED ORDER — ALPRAZOLAM 1 MG PO TABS: 1.0000 mg | ORAL_TABLET | Freq: Every evening | ORAL | Status: DC | PRN

## 2013-11-24 NOTE — Progress Notes (Signed)
   Subjective:    Patient ID: Jeffrey Krueger, male    DOB: July 05, 1956, 58 y.o.   MRN: [id number]  HPI Patient arrives for a follow from recent hospitalization for COPD. Patient states he is feeling better since he stopped smoking 9 days ago-currently wearing patch. Patient was in [facility] with COPD exacerbation now he is actually doing much better he still has some significant shortness of breath he is trying to work he denies any recent setbacks.  Review of Systems Patient relates shortness of breath with activity denies chest pain nausea vomiting diarrhea    Objective:   Physical Exam  Neck no masses lungs moves air not quite as well as am sure when he was young but moving air better than the last time he was here heart regular extremities no edema skin warm      Assessment & Plan:  COPD- need to use Symbicort 2 times daily Use albuterol when necessary Stay away from smoking currently using patches Recheck 6 months Patient has a history of a pulmonary nodule for which he did not do the followup as recommended so therefore we will set him up with a CT exam.

## 2013-11-24 NOTE — Patient Instructions (Signed)
Use symbicort more often - 2 times a day  Stay quit from smoking  We will set up appt for CT of chest to look at Pulmonary nodule

## 2013-12-05 ENCOUNTER — Telehealth: Payer: Self-pay | Admitting: Family Medicine

## 2013-12-05 NOTE — Telephone Encounter (Signed)
The new medication for anxiety is not working. He has kept headaches and it is keeping it up all night. He is hoping he can get his prescription for his xanax back.  [facility] [place]

## 2013-12-05 NOTE — Telephone Encounter (Signed)
NTC- gather more info plz

## 2013-12-05 NOTE — Telephone Encounter (Signed)
Notified wife that pharmacy used a different manufacturer to fill his xanax and if they want it changed she will need to contact [facility].

## 2013-12-05 NOTE — Telephone Encounter (Signed)
[facility] (need to know what medication he is currently taking. Not on med list)

## 2013-12-31 ENCOUNTER — Ambulatory Visit: Payer: Managed Care, Other (non HMO) | Admitting: Family Medicine

## 2014-01-12 ENCOUNTER — Telehealth: Payer: Self-pay | Admitting: Family Medicine

## 2014-01-12 NOTE — Telephone Encounter (Signed)
pts spouse calling to leave a message about his arms an elbows causing him pain again. He is in [place]  For the next few days an wants to know if he needs another round of Prednisone?   He is also trying to wean him self off the xanax's and has himself on a nicotine patch. He is having bad Headaches, wobbly legs and sleeping difficulties from this.  He wants to know if there is anything he can do To help with these side affects?   Call [name] back and she will give you the [facility] number [place]

## 2014-01-12 NOTE — Telephone Encounter (Signed)
I do not recommend quick tapering of Xanax. Instead Xanax 0.5 mg at nighttime might be the better route. This is less than what he was on but would still probably help him with rest and not be so dramatic as stopping the Xanax. In the long run he may well be able to stop it. As for his elbows I would recommend a leave store brand one to 2 twice daily. I don't recommend another round of prednisone. When patient is back in this region may not be a bad idea for him to followup

## 2014-01-12 NOTE — Telephone Encounter (Signed)
Discussed with pt's wife. 

## 2014-01-13 ENCOUNTER — Telehealth: Payer: Self-pay | Admitting: Family Medicine

## 2014-01-13 NOTE — Telephone Encounter (Signed)
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 He is requesting that he be taken off the xanax's and be prescribed something that is not a narcotic for his insomnia. Also, they need a letter wrote saying that he has been taken off the xanax's indefinitely.   [facility] [place]

## 2014-01-13 NOTE — Telephone Encounter (Signed)
[name] (wife) stated that patient will do the Melatonin for right now. Letter was mailed to patient per [name]'s request.

## 2014-01-13 NOTE — Telephone Encounter (Signed)
Options for sleep: Trazadone 50 mg qhs or OTC Melatonin ( 3 to 5 mg ) at night. Melatonin less likely to cause sleepiness in the am May do a letter.I'll sign

## 2014-01-16 ENCOUNTER — Telehealth: Payer: Self-pay | Admitting: Family Medicine

## 2014-01-16 MED ORDER — AZITHROMYCIN 250 MG PO TABS: ORAL_TABLET | ORAL | Status: DC

## 2014-01-16 MED ORDER — ALBUTEROL SULFATE HFA 108 (90 BASE) MCG/ACT IN AERS: 2.0000 | INHALATION_SPRAY | RESPIRATORY_TRACT | Status: DC | PRN

## 2014-01-16 MED ORDER — PREDNISONE 20 MG PO TABS: ORAL_TABLET | ORAL | Status: DC

## 2014-01-16 NOTE — Telephone Encounter (Signed)
Patients nebulizer machine has quit working and he took it by the pharmacy and it cannot be fixed. He does not have the money to buy a new machine right now, so his wife is requesting we send him something in to break up the congestion and mucous in his chest. [facility] [place]

## 2014-01-16 NOTE — Telephone Encounter (Signed)
Cough, chest congestion, No fever, wheezing. Taking mucinex not helping. Nebulizer has quit working and he cannnot affored another one. Does not have an inhaler. States spirvia is $50 dollars and cannot afford one. Please advise.

## 2014-01-16 NOTE — Telephone Encounter (Signed)
Discussed with pt. meds sent to pharm.  

## 2014-01-16 NOTE — Telephone Encounter (Signed)
What is difficult is that this patient has severe COPD he can use albuterol 2 puffs every 4 hours when necessary but Spiriva is also helpful. Unfortunately all of this stuff cost money. Patient at the very least needs to use the albuterol. Also short course of prednisone 20 mg 3 daily for 2 days, 2 daily for 2 days, then 1 daily for 2 days. In addition to this Z-Pak. Let me know if any problems. If ongoing issues or worse he needs to be seen either here or ER.

## 2014-01-19 ENCOUNTER — Telehealth: Payer: Self-pay | Admitting: Family Medicine

## 2014-01-19 NOTE — Telephone Encounter (Signed)
Patient needs a new Rx for a nebulizer machine to [facility]. [phone]

## 2014-01-19 NOTE — Telephone Encounter (Signed)
Notified patient via VM stating we sent in the new RX

## 2014-01-19 NOTE — Telephone Encounter (Signed)
[place] does not carry anymore, needs to go to [facility] [phone]

## 2014-01-22 ENCOUNTER — Telehealth: Payer: Self-pay | Admitting: Family Medicine

## 2014-01-22 NOTE — Telephone Encounter (Signed)
Pts nebulizer can not be filled by this company until we send them the office note  From his chart that's stating he had a visit in the last six months.  They need copies of these Notes from the doc that is ordering this   Please call pam if you have any questions

## 2014-01-23 NOTE — Telephone Encounter (Signed)
Patient signed release form and I faxed over.

## 2014-01-25 NOTE — Telephone Encounter (Signed)
This patient has significant COPD. It is difficult for him to use an inhaler do to low lung volumes in regards to moving air. This makes it difficult for him to use an inhaler. Inhalers have not been beneficial for him. Nebulizer use has been beneficial for him. This patient has been seen in the past 6 months for his COPD. Please send a copy of those notes plus also send a copy of this telephone dictation. His nebulizer treatment is clinically indicated.

## 2014-02-02 ENCOUNTER — Encounter: Payer: Self-pay | Admitting: Family Medicine

## 2014-02-02 ENCOUNTER — Ambulatory Visit (INDEPENDENT_AMBULATORY_CARE_PROVIDER_SITE_OTHER): Payer: Managed Care, Other (non HMO) | Admitting: Family Medicine

## 2014-02-02 VITALS — BP 130/84 | Ht 69.0 in | Wt 160.1 lb

## 2014-02-02 DIAGNOSIS — J441 Chronic obstructive pulmonary disease with (acute) exacerbation: Secondary | ICD-10-CM

## 2014-02-02 NOTE — Progress Notes (Signed)
   Subjective:    Patient ID: Jeffrey Krueger, male    DOB: [birth year], 58 y.o.   MRN: [id number]  HPI Patient is here for a hospitalization follow up visit. Patient was in [place] for [place] exacerbation. He has completed his prednisone and is feeling much better. Currently taking antibiotics and he is not sure what the name of it is. He needs to be released so he can return to work.    Review of Systems  Constitutional: Negative for activity change, appetite change and fatigue.  HENT: Negative for congestion.   Respiratory: Positive for shortness of breath. Negative for cough and choking.   Cardiovascular: Negative for chest pain.  Endocrine: Negative for polydipsia and polyphagia.  Neurological: Negative for weakness.  Psychiatric/Behavioral: Negative for confusion.       Objective:   Physical Exam  Vitals reviewed. Constitutional: He appears well-nourished. No distress.  Cardiovascular: Normal rate, regular rhythm and normal heart sounds.   No murmur heard. Pulmonary/Chest: Effort normal and breath sounds normal. No respiratory distress.  Musculoskeletal: He exhibits no edema.  Lymphadenopathy:    He has no cervical adenopathy.  Neurological: He is alert.  Psychiatric: His behavior is normal.          Assessment & Plan:  This patient has severe COPD he's been counseled to quit smoking he recently started smoking again but now he's quit I sent an e-mail to his workplace per his request stating that he was released to go back to work in addition to this patient was counseled to stay off smoking continue all his medicines and he will followup here every 3-5 months on his lungs as well as any other health problem that he has he will call us sooner if any problems currently he is stable and able to go back to work

## 2014-02-13 ENCOUNTER — Ambulatory Visit: Payer: Managed Care, Other (non HMO) | Admitting: Family Medicine

## 2014-03-20 ENCOUNTER — Telehealth: Payer: Self-pay | Admitting: Family Medicine

## 2014-03-20 NOTE — Telephone Encounter (Signed)
Patients left leg keeps swelling and he isn't sure why. 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 He wants to know what he can take to help this.

## 2014-03-20 NOTE — Telephone Encounter (Signed)
Advised patient he should go to the ER d/t left leg swelling to r/o blood clot. Patient verbalized understanding.

## 2014-03-31 ENCOUNTER — Telehealth: Payer: Self-pay | Admitting: Family Medicine

## 2014-03-31 MED ORDER — IPRATROPIUM-ALBUTEROL 20-100 MCG/ACT IN AERS: 1.0000 | INHALATION_SPRAY | Freq: Every day | RESPIRATORY_TRACT | Status: DC

## 2014-03-31 NOTE — Telephone Encounter (Signed)
Rx sent electronically to pharmacy. Patient notified. 

## 2014-03-31 NOTE — Telephone Encounter (Signed)
He may refill this time 6

## 2014-03-31 NOTE — Telephone Encounter (Signed)
Pt was put on a new inhaler while in the hospital in [place]  Combivent Respimat 20 mg/100 mcg 1 puff daily  To [facility] in [place]  He feels that this one is doing him better than the one we  Had him on.  He uses it once a day and feels good.   Questions feel free to call Aurther Lofterry

## 2014-04-20 ENCOUNTER — Telehealth: Payer: Self-pay | Admitting: Family Medicine

## 2014-04-20 NOTE — Telephone Encounter (Signed)
Dr. Roby LoftsScott's patient. Last seen 02/02/14

## 2014-04-20 NOTE — Telephone Encounter (Signed)
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 He has a house full of oxygen tanks that are not being used and They are having people come pick these machines up. [facility] said that in order for them to take off his big oxygen machine, they would need to be  Notified by our office before they could do so. [name] would like us to get in touch with them and tell them it is okay for them to take this machine tomorrow.    [facility] [phone]

## 2014-04-20 NOTE — Telephone Encounter (Signed)
Faxed over order

## 2014-04-20 NOTE — Telephone Encounter (Signed)
ok 

## 2014-05-05 ENCOUNTER — Telehealth: Payer: Self-pay | Admitting: Family Medicine

## 2014-05-05 MED ORDER — IPRATROPIUM-ALBUTEROL 20-100 MCG/ACT IN AERS: 1.0000 | INHALATION_SPRAY | Freq: Every day | RESPIRATORY_TRACT | Status: DC

## 2014-05-05 MED ORDER — PREDNISONE 20 MG PO TABS: ORAL_TABLET | ORAL | Status: DC

## 2014-05-05 NOTE — Telephone Encounter (Signed)
Pt is wanting to know if he can get a round of  predniSONE (DELTASONE) 20 MG tablet  He states he is feeling sluggish, soreness in back An neck. Has not get up an go. He states you have given Him this script for this in the past.   [facility] [place]

## 2014-05-05 NOTE — Telephone Encounter (Signed)
3qd for 3d then 2qd for 3d then 1qd for 3d Prednisone 20 mg ,#18 Refill combivent times 5

## 2014-05-05 NOTE — Telephone Encounter (Signed)
Pt states he is having flare ups with his copd. Using his inhalers. Also needs refill on combivent.

## 2014-05-05 NOTE — Telephone Encounter (Signed)
Notified [name] (wife) that medication was sent to pharmacy.

## 2014-05-05 NOTE — Telephone Encounter (Signed)
The reason we have done prednisone in the past was when he has had flareups of his COPD. It is meant for COPD flareups breathing difficulties that type of stuff. I typically don't recommend this in just because a person doesn't feel good. Nurses, Discuss with patient what is going on

## 2014-05-06 ENCOUNTER — Telehealth: Payer: Self-pay | Admitting: *Deleted

## 2014-05-06 ENCOUNTER — Other Ambulatory Visit: Payer: Self-pay | Admitting: *Deleted

## 2014-05-06 MED ORDER — IPRATROPIUM-ALBUTEROL 20-100 MCG/ACT IN AERS: INHALATION_SPRAY | RESPIRATORY_TRACT | Status: DC

## 2014-05-06 NOTE — Telephone Encounter (Signed)
Change to qid

## 2014-05-06 NOTE — Telephone Encounter (Signed)
Pharm called to clarify directions on combivent. It was sent in as one inhalation once daily. Normal instructions are one inhaltation QID. Message just said to refill and it was sent in the past as one inhaltion once daily. Please advise. [facility] [place] [phone]

## 2014-05-22 ENCOUNTER — Other Ambulatory Visit: Payer: Self-pay | Admitting: Family Medicine

## 2014-05-22 ENCOUNTER — Telehealth: Payer: Self-pay | Admitting: Family Medicine

## 2014-05-22 MED ORDER — ALBUTEROL SULFATE HFA 108 (90 BASE) MCG/ACT IN AERS: 1.0000 | INHALATION_SPRAY | Freq: Four times a day (QID) | RESPIRATORY_TRACT | Status: DC | PRN

## 2014-05-22 NOTE — Telephone Encounter (Signed)
Patient needs prescription for albuterol proventil inhaler ASAP . Wife states he is out of town and needs one called into [facility] [phone]. He has no more refills on current prescription.

## 2014-05-22 NOTE — Telephone Encounter (Signed)
Left message on voicemail notifying patient medication was sent to pharmacy.  

## 2014-05-22 NOTE — Addendum Note (Signed)
Addended by: Dereck Ligas on: 05/22/2014 01:53 PM   Modules accepted: Orders

## 2014-05-25 ENCOUNTER — Telehealth: Payer: Self-pay | Admitting: Family Medicine

## 2014-05-25 NOTE — Telephone Encounter (Signed)
Jeffrey Krueger needs a letter just like you wrote for Jeffrey Krueger To whom it may concern at [employer] the company to restrict him to an automatic only (auto shift) an state the reason why you feel Jeffrey Krueger needs  This done, due to his left leg swelling. So he don't have to put  As much pressure on that leg.   Please fax this letter   [phone] fax Attn HR dept   Pt would like this done before noon tomorrow if you can?  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

## 2014-05-26 NOTE — Telephone Encounter (Signed)
Patients spouse called to check on this again. Please advise.

## 2014-05-26 NOTE — Telephone Encounter (Signed)
this takes time

## 2014-05-26 NOTE — Telephone Encounter (Signed)
Because of leg concerns/swelling/mild peripheral vascular disease I do recommend that this patient uses automatic shifting. No stick shift. He is capable of working. A letter was completed. Please fax this letter.

## 2014-05-27 NOTE — Telephone Encounter (Signed)
Letter faxed. Original going to scan center.

## 2014-06-08 ENCOUNTER — Telehealth: Payer: Self-pay | Admitting: Family Medicine

## 2014-06-08 MED ORDER — ALBUTEROL SULFATE (2.5 MG/3ML) 0.083% IN NEBU: 2.5000 mg | INHALATION_SOLUTION | Freq: Four times a day (QID) | RESPIRATORY_TRACT | Status: DC | PRN

## 2014-06-08 MED ORDER — IPRATROPIUM-ALBUTEROL 20-100 MCG/ACT IN AERS: INHALATION_SPRAY | RESPIRATORY_TRACT | Status: DC

## 2014-06-08 MED ORDER — AZITHROMYCIN 250 MG PO TABS: ORAL_TABLET | ORAL | Status: DC

## 2014-06-08 MED ORDER — PREDNISONE 20 MG PO TABS: ORAL_TABLET | ORAL | Status: DC

## 2014-06-08 NOTE — Telephone Encounter (Signed)
Rx sent electronically to pharmacy. Patient notified. 

## 2014-06-08 NOTE — Telephone Encounter (Signed)
pred taper-18- 3qd for 3d then 2qd for 3d then 1qd for 3d, Z pack

## 2014-06-08 NOTE — Telephone Encounter (Signed)
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  For the first 5 days, he was in [place] without any AC in extreme heat.  Then he went to [place], [place] where the temperature is very low.  Patients wife thinks he has bronchitis, because he is wheezing(does not think due to COPD), stopped up in head and throat.  He filled his inhaler, but its not helping. Wants to know if we can send in something to [facility] in [place], [place]. P# [phone]

## 2014-06-08 NOTE — Telephone Encounter (Signed)
Pt also wanting to have his inhaler called in as well with this set of scripts  Also needs albuterol refill   [facility] [place]

## 2014-06-11 ENCOUNTER — Telehealth: Payer: Self-pay | Admitting: Family Medicine

## 2014-06-11 NOTE — Telephone Encounter (Signed)
Pharmacy states too expensive. No great cheap choices unless pharmacy knows one. Pt needs albuterol hfa. This can be used prn. As for daily use other choices are advair and symbicort or spiriva. All are expensive . 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

## 2014-06-11 NOTE — Telephone Encounter (Signed)
Notified Camelia Eng (wife) pharmacy states too expensive. No great cheap choices unless pharmacy knows one. Pt needs albuterol hfa. This can be used prn. As for daily use other choices are advair and symbicort or spiriva. All are expensive. Pt should check with insur co to see which one is cheapest for him and let us know then we can prescribe. [name] verbalized understanding.

## 2014-07-01 ENCOUNTER — Other Ambulatory Visit: Payer: Self-pay | Admitting: Family Medicine

## 2014-07-02 ENCOUNTER — Other Ambulatory Visit: Payer: Self-pay | Admitting: Family Medicine

## 2014-07-02 NOTE — Telephone Encounter (Signed)
error 

## 2014-08-07 ENCOUNTER — Telehealth: Payer: Self-pay | Admitting: Family Medicine

## 2014-08-07 MED ORDER — PREDNISONE 20 MG PO TABS: ORAL_TABLET | ORAL | Status: DC

## 2014-08-07 NOTE — Telephone Encounter (Signed)
Prednisone is not for regular use. It has too many side effects. May have one prednisone taper, 20 mg tablet, #18,3qd for 3d then 2qd for 3d then 1qd for 3d I would recommend patient find out from his insurance company what inhalers they do cover for COPD then he can let us know.if he has ongoing trouble he needs to follow-up

## 2014-08-07 NOTE — Telephone Encounter (Signed)
Rx sent electronically to pharmacy. Patient notified. 

## 2014-08-07 NOTE — Telephone Encounter (Signed)
Patients insurance denied his inhaler because they only allow it so many times a year.  Patient thinks that the prednisone helps him breathe better and he wants to know if we can call this in for him?  [facility]

## 2014-08-07 NOTE — Telephone Encounter (Signed)
Patient to check with insurance and call back next week with name of inhaler that is covered.

## 2014-09-16 ENCOUNTER — Ambulatory Visit (INDEPENDENT_AMBULATORY_CARE_PROVIDER_SITE_OTHER): Payer: Managed Care, Other (non HMO) | Admitting: Nurse Practitioner

## 2014-09-16 ENCOUNTER — Encounter: Payer: Self-pay | Admitting: Nurse Practitioner

## 2014-09-16 ENCOUNTER — Telehealth: Payer: Self-pay

## 2014-09-16 ENCOUNTER — Other Ambulatory Visit: Payer: Self-pay

## 2014-09-16 VITALS — BP 138/82 | Temp 98.0°F | Ht 69.0 in | Wt 153.0 lb

## 2014-09-16 DIAGNOSIS — Z9119 Patient's noncompliance with other medical treatment and regimen: Secondary | ICD-10-CM | POA: Diagnosis not present

## 2014-09-16 DIAGNOSIS — F4322 Adjustment disorder with anxiety: Secondary | ICD-10-CM

## 2014-09-16 DIAGNOSIS — R062 Wheezing: Secondary | ICD-10-CM

## 2014-09-16 DIAGNOSIS — J441 Chronic obstructive pulmonary disease with (acute) exacerbation: Secondary | ICD-10-CM | POA: Diagnosis not present

## 2014-09-16 DIAGNOSIS — Z720 Tobacco use: Secondary | ICD-10-CM | POA: Diagnosis not present

## 2014-09-16 DIAGNOSIS — Z91199 Patient's noncompliance with other medical treatment and regimen due to unspecified reason: Secondary | ICD-10-CM

## 2014-09-16 MED ORDER — PREDNISONE 20 MG PO TABS: ORAL_TABLET | ORAL | Status: DC

## 2014-09-16 MED ORDER — CEFDINIR 300 MG PO CAPS: 300.0000 mg | ORAL_CAPSULE | Freq: Two times a day (BID) | ORAL | Status: DC

## 2014-09-16 MED ORDER — ALBUTEROL SULFATE (2.5 MG/3ML) 0.083% IN NEBU: INHALATION_SOLUTION | RESPIRATORY_TRACT | Status: DC

## 2014-09-16 MED ORDER — CITALOPRAM HYDROBROMIDE 20 MG PO TABS: 20.0000 mg | ORAL_TABLET | Freq: Every day | ORAL | Status: DC

## 2014-09-16 MED ORDER — ALBUTEROL SULFATE (2.5 MG/3ML) 0.083% IN NEBU
2.5000 mg | INHALATION_SOLUTION | Freq: Once | RESPIRATORY_TRACT | Status: AC
  Administered 2014-09-16: 2.5 mg via RESPIRATORY_TRACT

## 2014-09-16 MED ORDER — LEVOFLOXACIN 500 MG PO TABS: 500.0000 mg | ORAL_TABLET | Freq: Every day | ORAL | Status: DC

## 2014-09-16 MED ORDER — AZITHROMYCIN 250 MG PO TABS: ORAL_TABLET | ORAL | Status: DC

## 2014-09-16 MED ORDER — ALBUTEROL SULFATE HFA 108 (90 BASE) MCG/ACT IN AERS: 1.0000 | INHALATION_SPRAY | Freq: Four times a day (QID) | RESPIRATORY_TRACT | Status: DC | PRN

## 2014-09-16 NOTE — Telephone Encounter (Signed)
Pt's wife called stating that she called [employer] regarding COPD meds That they would cover. The ones they cover are: Gerre CouchSpiriva, Tudorza, Incruse, and Daliresp. She said Eber JonesCarolyn would pick one and call it  In to the pharmacy by the end of the day.

## 2014-09-16 NOTE — Patient Instructions (Signed)
Preferred drug for COPD for daily use

## 2014-09-17 ENCOUNTER — Other Ambulatory Visit: Payer: Self-pay | Admitting: Nurse Practitioner

## 2014-09-17 MED ORDER — TIOTROPIUM BROMIDE MONOHYDRATE 18 MCG IN CAPS: 18.0000 ug | ORAL_CAPSULE | Freq: Every day | RESPIRATORY_TRACT | Status: DC

## 2014-09-20 ENCOUNTER — Encounter: Payer: Self-pay | Admitting: Nurse Practitioner

## 2014-09-20 NOTE — Progress Notes (Signed)
Subjective:  Presents with his wife for complaints of a flareup of his COPD. Currently using his albuterol at least 3-4 times per day for the past 2-3 weeks. Had to stop Combivent due to extreme cost. Nonpreferred under his new insurance. Frequent cough. Producing mucus at times. No fever. Head congestion. Runny nose. Headache around the eyes. Sore throat. No ear pain. No fever. Some achiness at bedtime when he is trying to sleep mainly in the legs. No edema. Drives a truck for living. Has to be careful about types of medications that he takes. Admits to some anxiety. Denies suicidal or homicidal thoughts or ideation. Would like to take noncontrolled medication for his symptoms. Has cut back on his smoking, was on 2 packs per day has cut back to about one pack per day. Would like to quit eventually once his anxiety has improved.  Objective:   BP 138/82 mmHg  Temp(Src) 98 F (36.7 C)  Ht 5\' 9"  (1.753 m)  Wt 153 lb (69.4 kg)  BMI 22.58 kg/m2 NAD. Alert, oriented. TMs mild clear effusion, no erythema. Pharynx clear. Neck supple with mild soft anterior adenopathy. Lungs initially expiratory wheezes heard throughout lung fields, mild retractions. No tachypnea. Normal color. Given albuterol 2.5 mg nebulizer treatment. Diminished breath sounds overall consistent with COPD, no further wheezing. Subjective improvement of symptoms. Heart regular rate rhythm.  Assessment:  Problem List Items Addressed This Visit      Respiratory   COPD exacerbation - Primary   Relevant Medications      albuterol (PROVENTIL) (2.5 MG/3ML) 0.083% nebulizer solution 2.5 mg (Completed)      predniSONE (DELTASONE) tablet      albuterol (PROVENTIL) (2.5 MG/3ML) 0.083% nebulizer solution      albuterol (PROVENTIL HFA;VENTOLIN HFA) 108 (90 BASE) MCG/ACT inhaler     Other   Tobacco abuse    Other Visit Diagnoses    Wheezing        Relevant Medications       albuterol (PROVENTIL) (2.5 MG/3ML) 0.083% nebulizer solution 2.5 mg  (Completed)    Noncompliance        Adjustment disorder with anxious mood          nocturnal leg spasms    Plan:  Meds ordered this encounter  Medications  . albuterol (PROVENTIL) (2.5 MG/3ML) 0.083% nebulizer solution 2.5 mg    Sig:   . DISCONTD: azithromycin (ZITHROMAX Z-PAK) 250 MG tablet    Sig: Take 2 tablets (500 mg) on  Day 1,  followed by 1 tablet (250 mg) once daily on Days 2 through 5.    Dispense:  6 each    Refill:  0    Order Specific Question:  Supervising Provider    Answer:  Merlyn AlbertLUKING, WILLIAM S [[id number]]  . predniSONE (DELTASONE) 20 MG tablet    Sig: 3 po qd x 3 d then 2 po qd x 3 d then 1 po qd x 3 d    Dispense:  18 tablet    Refill:  0    Order Specific Question:  Supervising Provider    Answer:  Merlyn AlbertLUKING, WILLIAM S [[id number]]  . albuterol (PROVENTIL) (2.5 MG/3ML) 0.083% nebulizer solution    Sig: inhale contents of 1 vial in nebulizer every 4 hours if needed for wheezing    Dispense:  50 vial    Refill:  5    Please give 2 boxes of the vials    Order Specific Question:  Supervising Provider    Answer:  Merlyn AlbertLUKING, WILLIAM S [[id number]]  . albuterol (PROVENTIL HFA;VENTOLIN HFA) 108 (90 BASE) MCG/ACT inhaler    Sig: Inhale 1-2 puffs into the lungs every 6 (six) hours as needed for wheezing or shortness of breath.    Dispense:  18 g    Refill:  5    Order Specific Question:  Supervising Provider    Answer:  Merlyn AlbertLUKING, WILLIAM S [[id number]]  . citalopram (CELEXA) 20 MG tablet    Sig: Take 1 tablet (20 mg total) by mouth daily. At bedtime    Dispense:  30 tablet    Refill:  2    Order Specific Question:  Supervising Provider    Answer:  Merlyn AlbertLUKING, WILLIAM S [[id number]]  . DISCONTD: levofloxacin (LEVAQUIN) 500 MG tablet    Sig: Take 1 tablet (500 mg total) by mouth daily.    Dispense:  10 tablet    Refill:  0    Order Specific Question:  Supervising Provider    Answer:  Merlyn AlbertLUKING, WILLIAM S [[id number]]   because of potential interaction of Levaquin and Zithromax with Celexa, switch to Omnicef 300  mg 1 by mouth twice a day 10 days. Start Celexa 1 tablet bedtime, reviewed potential adverse effects. DC med and call if any problems. Start with half a tab Flexeril 10 mg at bedtime, discussed drowsiness precautions. Do not take while driving. Advised patient to call back with preferred drug for COPD preventive. Call back after visit, started on Spiriva. Discussed importance of smoking cessation. 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 Call back to our office sooner if any problems. Hold on flu vaccine today due to wheezing and prednisone taper. Patient plans to get flu vaccine at pharmacy once he has completed his regimen.

## 2014-10-07 ENCOUNTER — Other Ambulatory Visit: Payer: Self-pay | Admitting: Family Medicine

## 2014-10-07 NOTE — Telephone Encounter (Signed)
[facility] called needing [facility] [phone] for Proventil HFA(ventolin HFA 108 90 Base,Albuterol (proventil 2.5 mg nebulizer solution he is completely out.

## 2014-10-07 NOTE — Telephone Encounter (Signed)
Medication refills called in on pharmacy voicemail at number below. Jeffrey Krueger was notified.

## 2014-12-02 ENCOUNTER — Other Ambulatory Visit: Payer: Self-pay | Admitting: *Deleted

## 2014-12-02 ENCOUNTER — Telehealth: Payer: Self-pay | Admitting: Family Medicine

## 2014-12-02 MED ORDER — PREDNISONE 20 MG PO TABS: ORAL_TABLET | ORAL | Status: DC

## 2014-12-02 NOTE — Telephone Encounter (Signed)
Discussed with pt's wife that prednisone was sent in and he needs follow up if ongoing and needs follow up this spring for COPD. Pt's wife is also asking for an antibiotic. No fever. Coughing up green sputum. Started 1 week ago. Using rob dm.

## 2014-12-02 NOTE — Telephone Encounter (Signed)
Pt is wanting to know if you will call him in a refill for his prednisone and  For Bronchitis. Advised we don't call in antibiotics  [facility]

## 2014-12-02 NOTE — Telephone Encounter (Signed)
Discussed with pt. He needs to go to urgent care or ED to be seen to get antibiotics because he was not able to come in today for an appt. Out of town until Friday. Pt agreed.

## 2014-12-02 NOTE — Telephone Encounter (Signed)
May refill the prednisone. He will need an office visit if ongoing troubles. He should also follow up this spring regarding his COPD.

## 2014-12-02 NOTE — Telephone Encounter (Signed)
Patient needs to be seen. It is not good medicine to call in antibiotics site unseen.

## 2015-01-27 ENCOUNTER — Telehealth: Payer: Self-pay | Admitting: Family Medicine

## 2015-01-27 MED ORDER — ALBUTEROL SULFATE (2.5 MG/3ML) 0.083% IN NEBU: INHALATION_SOLUTION | RESPIRATORY_TRACT | Status: DC

## 2015-01-27 MED ORDER — ALBUTEROL SULFATE HFA 108 (90 BASE) MCG/ACT IN AERS: 1.0000 | INHALATION_SPRAY | Freq: Four times a day (QID) | RESPIRATORY_TRACT | Status: DC | PRN

## 2015-01-27 NOTE — Telephone Encounter (Signed)
albuterol (PROVENTIL) (2.5 MG/3ML) 0.083% nebulizer solution  albuterol (PROVENTIL HFA;VENTOLIN HFA) 108 (90 BASE) MCG/ACT inhaler (pro air please)  [facility] [phone]

## 2015-01-27 NOTE — Telephone Encounter (Signed)
Rx sent electronically to pharmacy. Patient notified. 

## 2015-01-27 NOTE — Addendum Note (Signed)
Addended by: Margaretha SheffieldBROWN, Kacia Halley S on: 01/27/2015 05:38 PM   Modules accepted: Orders

## 2015-02-03 ENCOUNTER — Encounter: Payer: Self-pay | Admitting: Family Medicine

## 2015-02-03 ENCOUNTER — Ambulatory Visit (INDEPENDENT_AMBULATORY_CARE_PROVIDER_SITE_OTHER): Payer: Managed Care, Other (non HMO) | Admitting: Family Medicine

## 2015-02-03 VITALS — BP 126/86 | Ht 69.0 in | Wt 154.4 lb

## 2015-02-03 DIAGNOSIS — R072 Precordial pain: Secondary | ICD-10-CM | POA: Diagnosis not present

## 2015-02-03 DIAGNOSIS — R252 Cramp and spasm: Secondary | ICD-10-CM | POA: Diagnosis not present

## 2015-02-03 DIAGNOSIS — J441 Chronic obstructive pulmonary disease with (acute) exacerbation: Secondary | ICD-10-CM

## 2015-02-03 DIAGNOSIS — R0902 Hypoxemia: Secondary | ICD-10-CM

## 2015-02-03 MED ORDER — PREDNISONE 10 MG PO TABS: ORAL_TABLET | ORAL | Status: DC

## 2015-02-03 MED ORDER — TRAMADOL HCL 50 MG PO TABS: ORAL_TABLET | ORAL | Status: DC

## 2015-02-03 NOTE — Progress Notes (Signed)
   Subjective:    Patient ID: Jeffrey Krueger, male    DOB: 02/24/1956, 59 y.o.   MRN: [id number]  Chest Pain  This is a new problem. The current episode started 1 to 4 weeks ago. The onset quality is gradual. The problem occurs every several days. The problem has been unchanged. The pain is present in the lateral region. The pain is at a severity of 4/10. The pain is mild. Associated symptoms include a cough, diaphoresis and shortness of breath. Pertinent negatives include no fever. The cough's precipitants include activity. The cough is relieved by a beta-agonist.     patient has severe COPD. He relates increased shortness of breath with activity in often gets moderately short of breath even with minimal activity. He denies high fever chills sweats vomiting. He still smokes. He is trying to quit. [place] [facility] [place] he has signed a release here and will try to have the records of this sent to us. He relates they did a chest x-ray EKG blood work and told him it was his lungs end and COPD and told him he could go  Review of Systems  Constitutional: Positive for diaphoresis. Negative for fever and activity change.  HENT: Positive for congestion and rhinorrhea. Negative for ear pain.   Eyes: Negative for discharge.  Respiratory: Positive for cough and shortness of breath. Negative for wheezing.   Cardiovascular: Positive for chest pain.       Objective:   Physical Exam  Constitutional: He appears well-developed.  HENT:  Head: Normocephalic.  Mouth/Throat: Oropharynx is clear and moist. No oropharyngeal exudate.  Neck: Normal range of motion.  Cardiovascular: Normal rate, regular rhythm and normal heart sounds.   No murmur heard. Pulmonary/Chest: Effort normal and breath sounds normal. He has no wheezes.  Lymphadenopathy:    He has no cervical adenopathy.  Neurological: He exhibits normal muscle tone.  Skin: Skin is warm and dry.  Nursing note and vitals  reviewed.         Assessment & Plan:   chest pain-I feel that this is musculoskeletal related to his emphysema /COPD exacerbation it does not have coronary artery disease characteristics. EKG looks normal.   severe COPD-I'm encourages patient to be on chronic medications to try to help him unfortunately he cannot afford these medications. He does use the albuterol on a frequent basis I would recommend a prednisone taper over the next 4 weeks.    patient does have sharp chest pains on the left side tramadol given evening use when he is not driving  Cervical spine troubles I feel that this is probably more musculoskeletal I don't find evidence of herniated disc. He does not have any weakness in either arm. If it is progressive he needs a follow-up we would consider x-rays and possible MRI    remarkably this patient continues to work area I personally feel that he could file for disability and qualify. But currently he would prefer to work because he feels he still K. He will let us know if he cannot   patient also having muscle cramps in the arms and in the legs we will check some lab work to look for electrolyte disorders   oxygen was checked on room air 94% with walking the lowest O2 was 90. I believe this patient at times has serious troubles and may need oxygen  Especially when he has severe COPD exacerbationscurrently right now does not qualify for

## 2015-02-04 ENCOUNTER — Encounter: Payer: Self-pay | Admitting: Family Medicine

## 2015-02-04 LAB — BASIC METABOLIC PANEL
BUN/Creatinine Ratio: 15 (ref 9–20)
BUN: 16 mg/dL (ref 6–24)
CALCIUM: 9.3 mg/dL (ref 8.7–10.2)
CHLORIDE: 101 mmol/L (ref 97–108)
CO2: 25 mmol/L (ref 18–29)
Creatinine, Ser: 1.05 mg/dL (ref 0.76–1.27)
GFR calc Af Amer: 90 mL/min/{1.73_m2} (ref >59)
GFR, EST NON AFRICAN AMERICAN: 78 mL/min/{1.73_m2} (ref >59)
GLUCOSE: 79 mg/dL (ref 65–99)
POTASSIUM: 4 mmol/L (ref 3.5–5.2)
SODIUM: 143 mmol/L (ref 134–144)

## 2015-02-04 LAB — MAGNESIUM: MAGNESIUM: 2.3 mg/dL (ref 1.6–2.3)

## 2015-03-02 ENCOUNTER — Encounter: Payer: Self-pay | Admitting: Family Medicine

## 2015-03-26 ENCOUNTER — Telehealth: Payer: Self-pay | Admitting: Family Medicine

## 2015-03-26 MED ORDER — ALBUTEROL SULFATE HFA 108 (90 BASE) MCG/ACT IN AERS: 1.0000 | INHALATION_SPRAY | Freq: Four times a day (QID) | RESPIRATORY_TRACT | Status: DC | PRN

## 2015-03-26 MED ORDER — ALBUTEROL SULFATE (2.5 MG/3ML) 0.083% IN NEBU: INHALATION_SOLUTION | RESPIRATORY_TRACT | Status: DC

## 2015-03-26 NOTE — Telephone Encounter (Signed)
albuterol (PROVENTIL HFA;VENTOLIN HFA) 108 (90 BASE) MCG/ACT inhaler albuterol (PROVENTIL) (2.5 MG/3ML) 0.083% nebulizer solution  Pt needs refills on this, he states they are telling him he has no refills but what I see  on the computer he should still have at least 3   [facility]

## 2015-03-26 NOTE — Telephone Encounter (Signed)
Rx sent electronically to pharmacy. Patient notified. 

## 2015-03-30 ENCOUNTER — Telehealth: Payer: Self-pay | Admitting: Family Medicine

## 2015-03-30 MED ORDER — PREDNISONE 20 MG PO TABS: ORAL_TABLET | ORAL | Status: AC

## 2015-03-30 NOTE — Telephone Encounter (Signed)
Prednisone 20 mg , #18 , 3qd for 3d then 2qd for 3d then 1qd for 3d  

## 2015-03-30 NOTE — Telephone Encounter (Signed)
predniSONE (DELTASONE) 10 MG tablet  Pt would like to have a refill on this to help him with his breathing  [facility]

## 2015-03-30 NOTE — Telephone Encounter (Signed)
Rx sent electronically to pharmacy. Patient notified. 

## 2015-03-30 NOTE — Telephone Encounter (Signed)
May send a refill of this in , follow up here if ongoing

## 2015-05-12 ENCOUNTER — Telehealth: Payer: Self-pay | Admitting: Family Medicine

## 2015-05-12 MED ORDER — MELOXICAM 7.5 MG PO TABS: 7.5000 mg | ORAL_TABLET | Freq: Every day | ORAL | Status: DC

## 2015-05-12 NOTE — Telephone Encounter (Signed)
Patient states that he is experiencing some soreness in his neck and legs. Also stiffness all over. Patient states that the doctor has given him prednisone in the past for this issue and he would like a tapered dose sent to the pharmacy. It helps him a lot with these symptoms.

## 2015-05-12 NOTE — Telephone Encounter (Signed)
Notified patient prednisone tapers are not recommended for this type of day. There are occasions we will use a prednisone taper for a bursitis or even a lung inflammation. As for joint discomforts we can try meloxicam 7.5 mg 1 daily #21, 2 refills, 1 daily this should help. Patient verbalized understanding. Med sent to pharmacy.

## 2015-05-12 NOTE — Telephone Encounter (Signed)
Pt is requesting a refill on prednisone to be called in. Pt is home and is needing it for pain.  [facility] [address] [place]

## 2015-05-12 NOTE — Telephone Encounter (Signed)
This is absolutely not the thing to do. I don't recommend prednisone for pain. Nurse's please discuss much more detail of what is going on with the patient before I know if I can help him or if he will to be seen

## 2015-05-12 NOTE — Telephone Encounter (Signed)
Unfortunately this patient has a fundamental misunderstanding. Prednisone tapers are not recommended for this type of day. There are occasions we will use a prednisone taper for a bursitis or even a lung inflammation. As for joint discomforts we can try meloxicam 7.5 mg 1 daily #21, 2 refills, 1 daily this should help

## 2015-07-05 ENCOUNTER — Other Ambulatory Visit: Payer: Self-pay | Admitting: Acute Care

## 2015-07-05 DIAGNOSIS — F17210 Nicotine dependence, cigarettes, uncomplicated: Secondary | ICD-10-CM

## 2015-07-16 ENCOUNTER — Encounter: Payer: Managed Care, Other (non HMO) | Admitting: Acute Care

## 2015-07-16 ENCOUNTER — Ambulatory Visit (HOSPITAL_BASED_OUTPATIENT_CLINIC_OR_DEPARTMENT_OTHER): Payer: Managed Care, Other (non HMO)

## 2015-07-16 ENCOUNTER — Telehealth: Payer: Self-pay | Admitting: Family Medicine

## 2015-07-16 MED ORDER — ZOLPIDEM TARTRATE 5 MG PO TABS: ORAL_TABLET | ORAL | Status: DC

## 2015-07-16 MED ORDER — PREDNISONE 20 MG PO TABS: ORAL_TABLET | ORAL | Status: DC

## 2015-07-16 NOTE — Telephone Encounter (Signed)
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 She says he sleeps about 8 hours or so while she is driving.

## 2015-07-16 NOTE — Telephone Encounter (Signed)
Pt is now riding with his wife and is taking over the counter sleep aids but they aren't working. Pt is wanting something non habit forming that will help him sleep. Pt is also wanting prednisone called in for his elbow. Please advise.  [facility] [place] [address]

## 2015-07-16 NOTE — Telephone Encounter (Signed)
#  1 may use prednisone 20 mg, #18,3qd for 3d then 2qd for 3d then 1qd for 3d #2-as for the sleep, is is at home? 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 There are different sleep medicines that can be used but all of them require that at least 8 hours be set aside to avoid the risk of drowsiness when operating equipment

## 2015-07-16 NOTE — Telephone Encounter (Signed)
Called and spoke with wife regarding the request for prednisone. Patient's wife states that the patient believes that it is tendonitis. 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 He is requesting prednisone because he says that it has helped him in the past. Also requesting another sleep medication because the melatonin that he was taking OTC in no longer working. Wants something for sleep that can be called in.Please advise?

## 2015-07-16 NOTE — Telephone Encounter (Signed)
Patient wanted to know if it was something besides Xanax that you could send in for sleep.

## 2015-07-16 NOTE — Telephone Encounter (Signed)
Called patient and informed him that Per Dr.Scott Luking we are sending ambien and prednisone to requested pharmacy. Patient verbalized understanding

## 2015-07-16 NOTE — Telephone Encounter (Signed)
Lynnell GrainGee whiz, ambien 5 mg qhs BUT MUST ALLOW 8 HOURS AFTER TAKING BEFORE DRIVING!! #40,9#30,1 refill

## 2015-07-16 NOTE — Telephone Encounter (Signed)
Xanax 0.5 mg one daily at bedtime when necessary, #30, must devote 8 hours to sleep, 1 refill

## 2015-07-19 ENCOUNTER — Telehealth: Payer: Self-pay | Admitting: Family Medicine

## 2015-07-19 NOTE — Telephone Encounter (Signed)
Patient had zolpidem (AMBIEN) 5 MG tablet [facility] [place] the other day but [facility] because they could not read it.  Jeffrey Krueger is requesting that a nurse call her back whenever they get a moment because they are no longer in [place] and she will let the nurse know which pharmacy to send to.

## 2015-07-19 NOTE — Telephone Encounter (Signed)
[facility] [place]- fax number [phone]. Consult with Dr Lorin PicketScott- may fax rx. Rx faxed to pharmacy. Patient notified.

## 2015-07-22 ENCOUNTER — Telehealth: Payer: Self-pay | Admitting: *Deleted

## 2015-07-22 MED ORDER — TRAZODONE HCL 50 MG PO TABS: 25.0000 mg | ORAL_TABLET | Freq: Every evening | ORAL | Status: DC | PRN

## 2015-07-22 NOTE — Telephone Encounter (Addendum)
[facility]otic [facility] [place] [phone]

## 2015-07-22 NOTE — Addendum Note (Signed)
Addended by: Margaretha SheffieldBROWN, AUTUMN S on: 07/22/2015 04:42 PM   Modules accepted: Orders

## 2015-07-22 NOTE — Telephone Encounter (Signed)
Rx sent electronically to pharmacy requested. Patient notified.

## 2015-07-22 NOTE — Telephone Encounter (Signed)
Per Dr Lorin PicketScott: Patient may try Trazodone 50 mg one half tablet at bedtime as needed-must devote 8 hrs to sleep. [facility] [place].

## 2015-08-13 ENCOUNTER — Ambulatory Visit (INDEPENDENT_AMBULATORY_CARE_PROVIDER_SITE_OTHER): Payer: Managed Care, Other (non HMO) | Admitting: Acute Care

## 2015-08-13 ENCOUNTER — Ambulatory Visit (HOSPITAL_BASED_OUTPATIENT_CLINIC_OR_DEPARTMENT_OTHER)
Admission: RE | Admit: 2015-08-13 | Discharge: 2015-08-13 | Disposition: A | Discharge status: Home / Self Care | Payer: Managed Care, Other (non HMO) | Source: Ambulatory Visit | Attending: Acute Care | Admitting: Acute Care

## 2015-08-13 ENCOUNTER — Encounter: Payer: Self-pay | Admitting: Acute Care

## 2015-08-13 DIAGNOSIS — J439 Emphysema, unspecified: Secondary | ICD-10-CM | POA: Diagnosis not present

## 2015-08-13 DIAGNOSIS — R05 Cough: Secondary | ICD-10-CM | POA: Insufficient documentation

## 2015-08-13 DIAGNOSIS — R0989 Other specified symptoms and signs involving the circulatory and respiratory systems: Secondary | ICD-10-CM | POA: Insufficient documentation

## 2015-08-13 DIAGNOSIS — F17210 Nicotine dependence, cigarettes, uncomplicated: Secondary | ICD-10-CM | POA: Diagnosis not present

## 2015-08-13 DIAGNOSIS — Z122 Encounter for screening for malignant neoplasm of respiratory organs: Secondary | ICD-10-CM | POA: Diagnosis not present

## 2015-08-13 NOTE — Progress Notes (Signed)
Shared Decision Making Visit Lung Cancer Screening Program [id number])   Eligibility:  Age 59 y.o.  Pack Years Smoking History Calculation 88 pack year smoking history (# packs/per year x # years smoked)  Recent History of coughing up blood  no  Unexplained weight loss? no ( >Than 15 pounds within the last 6 months )  Prior History Lung / other cancer no (Diagnosis within the last 5 years already requiring surveillance chest CT Scans).  Smoking Status Current Smoker Former Smokers: Years since quit: NA Current Smoker Quit Date: NA  Visit Components:  Discussion included one or more decision making aids. yes  Discussion included risk/benefits of screening. yes  Discussion included potential follow up diagnostic testing for abnormal scans. yes  Discussion included meaning and risk of over diagnosis. yes  Discussion included meaning and risk of False Positives. yes  Discussion included meaning of total radiation exposure. yes  Counseling Included:  Importance of adherence to annual lung cancer LDCT screening. yes  Impact of comorbidities on ability to participate in the program. yes  Ability and willingness to under diagnostic treatment. yes  Smoking Cessation Counseling:  Current Smokers:   Discussed importance of smoking cessation. yes  Information about tobacco cessation classes and interventions provided to patient. yes  Patient provided with "ticket" for LDCT Scan. yes  Symptomatic Patient. no  Counseling:NA  Diagnosis Code: Tobacco Use Z72.0  Asymptomatic Patient yes  Counseling (Intermediate counseling: > three minutes counseling) [id number]  Former Smokers:   Discussed the importance of maintaining cigarette abstinence.NA; current smoker  Diagnosis Code: Personal History of Nicotine Dependence. U98.119Z87.891  Information about tobacco cessation classes and interventions provided to patient. Yes  Patient provided with "ticket" for LDCT Scan. yes  Written  Order for Lung Cancer Screening with LDCT placed in Epic. Yes (CT Chest Lung Cancer Screening Low Dose W/O CM) [id number] Z12.2-Screening of respiratory organs Z87.891-Personal history of nicotine dependence  I spent 15 minutes with Jeffrey Krueger discussing the risks and benefits of lung cancer screening.We viewed a power point together that explained in detail the above noted topics. We paused at intervals to allow for questions to be asked and answered to ensure understanding. We discussed that the single most powerful action that he can take to decrease his risk of developing lung cancer is to stop smoking. He is currently not ready to set a quit date.We discussed the role of nicotine replacement therapy, non-nicotine medications,support groups and Quit Smart classes. We also discussed behavior modification, and setting smaller more achievable goals until he is ready to quit smoking.I have given him the " Be stronger than your excuses" card with the contact information for resources in the community, and for free nicotine replacement therapy.I have told him to call me when he is ready to set a quit date and that I will help him in any way I can to make sure he has the resources to be successful in liberating himself from cigarettes. We discussed the time and location of his scan, and that I will call Monday with his results.He verbalized understanding of all of the above and had no further questions upon leaving the office.Jeffrey Krueger asked many good questions during the appointment, all of which we answered for him.   Jeffrey NgoSarah F Orion Mole, NP

## 2015-08-16 ENCOUNTER — Ambulatory Visit (INDEPENDENT_AMBULATORY_CARE_PROVIDER_SITE_OTHER): Payer: Managed Care, Other (non HMO) | Admitting: Family Medicine

## 2015-08-16 ENCOUNTER — Encounter: Payer: Self-pay | Admitting: Family Medicine

## 2015-08-16 VITALS — BP 134/80 | HR 98 | Ht 69.0 in | Wt 157.5 lb

## 2015-08-16 DIAGNOSIS — J441 Chronic obstructive pulmonary disease with (acute) exacerbation: Secondary | ICD-10-CM

## 2015-08-16 DIAGNOSIS — Z125 Encounter for screening for malignant neoplasm of prostate: Secondary | ICD-10-CM | POA: Diagnosis not present

## 2015-08-16 DIAGNOSIS — Z79899 Other long term (current) drug therapy: Secondary | ICD-10-CM | POA: Diagnosis not present

## 2015-08-16 DIAGNOSIS — Z13220 Encounter for screening for lipoid disorders: Secondary | ICD-10-CM | POA: Diagnosis not present

## 2015-08-16 MED ORDER — BUDESONIDE-FORMOTEROL FUMARATE 160-4.5 MCG/ACT IN AERO: 2.0000 | INHALATION_SPRAY | Freq: Two times a day (BID) | RESPIRATORY_TRACT | Status: DC

## 2015-08-16 MED ORDER — PREDNISONE 20 MG PO TABS: ORAL_TABLET | ORAL | Status: DC

## 2015-08-16 MED ORDER — CITALOPRAM HYDROBROMIDE 10 MG PO TABS: 10.0000 mg | ORAL_TABLET | Freq: Every day | ORAL | Status: AC

## 2015-08-16 MED ORDER — ALPRAZOLAM 1 MG PO TABS: ORAL_TABLET | ORAL | Status: DC

## 2015-08-16 NOTE — Progress Notes (Signed)
   Subjective:    Patient ID: Jeffrey Krueger, male    DOB: April 15, 1956, 59 y.o.   MRN: [id number]  HPI Patient in today for COPD exacerbation.still smokes- 1 pack a day. He states he is going to try to quit smoking. We talked about different ways to help quit  . Patient states that he would also like to discuss insomnia. Patient was recently prescribed Trazadone for insomnia with no results. Patient states he would like to try something different help  Patient states he is very concerned of breathing.  He is noticed that his breathing is caused him issues he is only using albuterol he states he other medicines that seemed to help and he stopped using them. He had a CAT scan which did show some nodules very small but no cancers recommended repeat again in one years time   Legs numb with weakness. He relates his legs get weak he admits he does not do much walking because of his severe shortness of breath his legs get weak and wobble he does not fall.   Review of Systems  Constitutional: Negative for fever and activity change.  HENT: Positive for congestion. Negative for ear pain and rhinorrhea.   Eyes: Negative for discharge.  Respiratory: Positive for cough and shortness of breath. Negative for wheezing.   Cardiovascular: Negative for chest pain.       Objective:   Physical Exam  Constitutional: He appears well-developed.  HENT:  Head: Normocephalic.  Mouth/Throat: Oropharynx is clear and moist. No oropharyngeal exudate.  Neck: Normal range of motion.  Cardiovascular: Normal rate, regular rhythm and normal heart sounds.   No murmur heard. Pulmonary/Chest: Effort normal and breath sounds normal. He has no wheezes.  Lymphadenopathy:    He has no cervical adenopathy.  Neurological: He exhibits normal muscle tone.  Skin: Skin is warm and dry.  Nursing note and vitals reviewed.         Assessment & Plan:   I find no evidence of sciatica. I feel his weakness related to just  inactivity COPD encourage walking program encourage him to quit smoking restart Symbicort use albuterol as a rescue he will hold off on spur [place] annual CT scan recommended   insomnia Xanax when necessary cautioned drowsiness  patient with history of hyperlipidemia check lipid profile   greater than 25 minutes spent with this patient discussing COPD the treatment of COPD the importance of quitting smoking greater than half in discussion of questions

## 2015-10-22 ENCOUNTER — Ambulatory Visit (INDEPENDENT_AMBULATORY_CARE_PROVIDER_SITE_OTHER): Payer: Managed Care, Other (non HMO) | Admitting: Family Medicine

## 2015-10-22 ENCOUNTER — Encounter: Payer: Self-pay | Admitting: Family Medicine

## 2015-10-22 VITALS — BP 128/86 | Temp 98.0°F | Ht 69.0 in | Wt 159.9 lb

## 2015-10-22 DIAGNOSIS — J019 Acute sinusitis, unspecified: Secondary | ICD-10-CM

## 2015-10-22 DIAGNOSIS — J441 Chronic obstructive pulmonary disease with (acute) exacerbation: Secondary | ICD-10-CM | POA: Diagnosis not present

## 2015-10-22 MED ORDER — DOXYCYCLINE HYCLATE 100 MG PO CAPS: 100.0000 mg | ORAL_CAPSULE | Freq: Two times a day (BID) | ORAL | Status: DC

## 2015-10-22 MED ORDER — VALACYCLOVIR HCL 1 G PO TABS: ORAL_TABLET | ORAL | Status: DC

## 2015-10-22 MED ORDER — PREDNISONE 20 MG PO TABS: ORAL_TABLET | ORAL | Status: DC

## 2015-10-22 MED ORDER — METHYLPREDNISOLONE ACETATE 80 MG/ML IJ SUSP
80.0000 mg | Freq: Once | INTRAMUSCULAR | Status: AC
  Administered 2015-10-22: 80 mg via INTRAMUSCULAR

## 2015-10-22 NOTE — Progress Notes (Signed)
   Subjective:    Patient ID: Jeffrey Krueger, male    DOB: 29-Apr-1956, 60 y.o.   MRN: [id number]  Cough This is a new problem. The current episode started in the past 7 days. Associated symptoms include chills, a fever, headaches, myalgias, nasal congestion, rhinorrhea and wheezing. Pertinent negatives include no chest pain or ear pain. Treatments tried: OTC meds.    patient with significant head congestion drainage coughing wheezing some shortness of breath some chills some fever no fever recently   Review of Systems  Constitutional: Positive for fever and chills. Negative for activity change.  HENT: Positive for congestion and rhinorrhea. Negative for ear pain.   Eyes: Negative for discharge.  Respiratory: Positive for cough and wheezing.   Cardiovascular: Negative for chest pain.  Musculoskeletal: Positive for myalgias.  Neurological: Positive for headaches.       Objective:   Physical Exam  Constitutional: He appears well-developed.  HENT:  Head: Normocephalic.  Mouth/Throat: Oropharynx is clear and moist. No oropharyngeal exudate.  Neck: Normal range of motion.  Cardiovascular: Normal rate, regular rhythm and normal heart sounds.   No murmur heard. Pulmonary/Chest: Effort normal and breath sounds normal. He has no wheezes.  Lymphadenopathy:    He has no cervical adenopathy.  Neurological: He exhibits normal muscle tone.  Skin: Skin is warm and dry.  Nursing note and vitals reviewed.     patient has been counseled to stay away from smoking     Assessment & Plan:   COPD exacerbation Rhinosinusitis  steroids Should gradually get better antibiotics prescribed no need for x-rays or lab work Also seborrheic dermatitis already has steroid cream for this use warm soapy water washes as necessary

## 2015-11-18 ENCOUNTER — Ambulatory Visit: Payer: Managed Care, Other (non HMO) | Admitting: Family Medicine

## 2015-11-19 ENCOUNTER — Ambulatory Visit: Payer: Managed Care, Other (non HMO) | Admitting: Family Medicine

## 2015-11-29 ENCOUNTER — Ambulatory Visit (INDEPENDENT_AMBULATORY_CARE_PROVIDER_SITE_OTHER): Payer: Managed Care, Other (non HMO) | Admitting: Family Medicine

## 2015-11-29 ENCOUNTER — Encounter: Payer: Self-pay | Admitting: Family Medicine

## 2015-11-29 VITALS — BP 122/80 | Temp 98.0°F | Ht 69.0 in | Wt 162.0 lb

## 2015-11-29 DIAGNOSIS — R252 Cramp and spasm: Secondary | ICD-10-CM | POA: Diagnosis not present

## 2015-11-29 DIAGNOSIS — J441 Chronic obstructive pulmonary disease with (acute) exacerbation: Secondary | ICD-10-CM | POA: Diagnosis not present

## 2015-11-29 MED ORDER — PREDNISONE 20 MG PO TABS: ORAL_TABLET | ORAL | Status: DC

## 2015-11-29 MED ORDER — CYCLOBENZAPRINE HCL 5 MG PO TABS: ORAL_TABLET | ORAL | Status: DC

## 2015-11-29 NOTE — Progress Notes (Signed)
   Subjective:    Patient ID: Jeffrey Krueger, male    DOB: [birth year], 60 y.o.   MRN: [id number]  Arm Pain  The incident occurred more than 1 week ago. There was no injury mechanism. The pain is present in the right hand, right forearm and upper right arm. The quality of the pain is described as aching. The pain does not radiate. The pain is moderate. Nothing aggravates the symptoms. He has tried acetaminophen and NSAIDs for the symptoms. The treatment provided no relief.   Patient is also having bad leg cramps.   patient relates intermittent arm cramping relates intermittent leg cramping as well denies any chest tightness pressure pain does have COPD and at times does get short of breath occasionally uses prednisone for flareups. Has not had any infections recently. No fevers or vomiting.  Review of Systems     Objective:   Physical Exam  lungs are clear hearts regular sinus nontender abdomen soft extremities strength is good bilateral. No unilateral features noted       Assessment & Plan:   significant muscle cramps affecting the right arm as well as the legs intermittent over the past few weeks sometimes worse at night will do extensive lab work. 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 Await the results of the lab work.   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 He was instructed if he ever has high fever chills or other problems he needs to be seen here or in a ER

## 2015-11-30 LAB — BASIC METABOLIC PANEL
BUN/Creatinine Ratio: 14 (ref 9–20)
BUN: 14 mg/dL (ref 6–24)
CALCIUM: 9.2 mg/dL (ref 8.7–10.2)
CHLORIDE: 100 mmol/L (ref 96–106)
CO2: 26 mmol/L (ref 18–29)
CREATININE: 0.99 mg/dL (ref 0.76–1.27)
GFR calc Af Amer: 96 mL/min/{1.73_m2} (ref >59)
GFR calc non Af Amer: 83 mL/min/{1.73_m2} (ref >59)
Glucose: 79 mg/dL (ref 65–99)
POTASSIUM: 3.7 mmol/L (ref 3.5–5.2)
Sodium: 144 mmol/L (ref 134–144)

## 2015-11-30 LAB — T4, FREE: FREE T4: 1.23 ng/dL (ref 0.82–1.77)

## 2015-11-30 LAB — MAGNESIUM: Magnesium: 2.2 mg/dL (ref 1.6–2.3)

## 2015-11-30 LAB — FERRITIN: FERRITIN: 89 ng/mL (ref 30–400)

## 2015-11-30 LAB — TSH: TSH: 0.986 u[IU]/mL (ref 0.450–4.500)

## 2015-12-15 ENCOUNTER — Other Ambulatory Visit: Payer: Self-pay | Admitting: Acute Care

## 2015-12-15 DIAGNOSIS — F17210 Nicotine dependence, cigarettes, uncomplicated: Secondary | ICD-10-CM

## 2015-12-22 ENCOUNTER — Telehealth: Payer: Self-pay | Admitting: Family Medicine

## 2015-12-22 ENCOUNTER — Other Ambulatory Visit: Payer: Self-pay

## 2015-12-22 NOTE — Telephone Encounter (Signed)
Pt is calling to see if we can send his inhaler with 90 day supply   She would like for you to call her back so she can let you know what  [facility] to [facility] they can't use [facility] with their insurance

## 2015-12-23 ENCOUNTER — Other Ambulatory Visit: Payer: Self-pay

## 2015-12-23 MED ORDER — ALBUTEROL SULFATE HFA 108 (90 BASE) MCG/ACT IN AERS: 1.0000 | INHALATION_SPRAY | Freq: Four times a day (QID) | RESPIRATORY_TRACT | Status: DC | PRN

## 2015-12-23 NOTE — Telephone Encounter (Signed)
Talked with [name] yesterday to find out which pharmacy to send it to and she stated that she did not know at the current moment. She will call us back later and let us know when they reach their destination where to send it.

## 2016-01-24 ENCOUNTER — Ambulatory Visit (INDEPENDENT_AMBULATORY_CARE_PROVIDER_SITE_OTHER): Payer: Managed Care, Other (non HMO) | Admitting: Family Medicine

## 2016-01-24 ENCOUNTER — Encounter: Payer: Self-pay | Admitting: Family Medicine

## 2016-01-24 VITALS — BP 120/80 | Ht 69.0 in | Wt 157.0 lb

## 2016-01-24 DIAGNOSIS — J441 Chronic obstructive pulmonary disease with (acute) exacerbation: Secondary | ICD-10-CM

## 2016-01-24 MED ORDER — PREDNISONE 20 MG PO TABS: ORAL_TABLET | ORAL | Status: DC

## 2016-01-24 MED ORDER — CEFPROZIL 500 MG PO TABS: 500.0000 mg | ORAL_TABLET | Freq: Two times a day (BID) | ORAL | Status: DC

## 2016-01-24 MED ORDER — ALBUTEROL SULFATE HFA 108 (90 BASE) MCG/ACT IN AERS: 1.0000 | INHALATION_SPRAY | Freq: Four times a day (QID) | RESPIRATORY_TRACT | Status: DC | PRN

## 2016-01-24 MED ORDER — BUDESONIDE-FORMOTEROL FUMARATE 160-4.5 MCG/ACT IN AERO: 2.0000 | INHALATION_SPRAY | Freq: Two times a day (BID) | RESPIRATORY_TRACT | Status: DC

## 2016-01-24 NOTE — Progress Notes (Signed)
   Subjective:    Patient ID: Jeffrey Krueger, male    DOB: 10-24-1955, 60 y.o.   MRN: [id number]  HPI Patient is here today for a ER follow up visit. [facility]/16 for shortness of breath and cough. Patient is still having trouble with these symptoms. He was prescribed azithromycin and prednisone by the ER doctor and he has completed them with no relief.  Patient states that he has a metallic taste in his mouth also.  Patient went to the ER was evaluated was treated for COPD flareup put on more prednisone inhalers as well as antibiotics he states he is active doing somewhat better compared to how he was but he is not 100% certain if he is over it.  Review of Systems  Constitutional: Negative for fever and activity change.  HENT: Positive for congestion and rhinorrhea. Negative for ear pain.   Eyes: Negative for discharge.  Respiratory: Positive for cough and wheezing.   Cardiovascular: Negative for chest pain.       Objective:   Physical Exam  Constitutional: He appears well-developed.  HENT:  Head: Normocephalic.  Mouth/Throat: Oropharynx is clear and moist. No oropharyngeal exudate.  Neck: Normal range of motion.  Cardiovascular: Normal rate, regular rhythm and normal heart sounds.   No murmur heard. Pulmonary/Chest: Effort normal and breath sounds normal. He has no wheezes.  Lymphadenopathy:    He has no cervical adenopathy.  Neurological: He exhibits normal muscle tone.  Skin: Skin is warm and dry.  Nursing note and vitals reviewed.   Some cough some wheezing on exam but the wheezing is minimal O2 saturation 98%     Assessment & Plan:  COPD exacerbation-patient is uses inhalers properly. Patient counseled to stay away from smoking. Patient does not need further x-rays at this point I recommend minimizing use of prednisone. He was given a prescription for prednisone taper for which he could use if he has a COPD flareup right now do not  recommend this Anxiety medicine for bedtime to help with sleep patient up-to-date on refills

## 2016-02-24 ENCOUNTER — Other Ambulatory Visit: Payer: Self-pay

## 2016-02-24 ENCOUNTER — Telehealth: Payer: Self-pay | Admitting: Family Medicine

## 2016-02-24 MED ORDER — ALBUTEROL SULFATE (2.5 MG/3ML) 0.083% IN NEBU: INHALATION_SOLUTION | RESPIRATORY_TRACT | Status: DC

## 2016-02-24 MED ORDER — ALBUTEROL SULFATE HFA 108 (90 BASE) MCG/ACT IN AERS: 1.0000 | INHALATION_SPRAY | Freq: Four times a day (QID) | RESPIRATORY_TRACT | Status: DC | PRN

## 2016-02-24 MED ORDER — BUDESONIDE-FORMOTEROL FUMARATE 160-4.5 MCG/ACT IN AERO: 2.0000 | INHALATION_SPRAY | Freq: Two times a day (BID) | RESPIRATORY_TRACT | Status: DC

## 2016-02-24 MED ORDER — PREDNISONE 20 MG PO TABS: ORAL_TABLET | ORAL | Status: DC

## 2016-02-24 NOTE — Telephone Encounter (Signed)
Spoke with patient's wife and informed her per protocol refills for requested medications were sent into pharmacy.

## 2016-02-24 NOTE — Telephone Encounter (Signed)
Wife staets he never got rx filled in early may-needs it sent in

## 2016-02-24 NOTE — Telephone Encounter (Signed)
budesonide-formoterol (SYMBICORT) 160-4.5 MCG/ACT inhaler albuterol (PROVENTIL HFA;VENTOLIN HFA) 108 (90 Base) MCG/ACT inhaler   Pt needs refills on this [facility] [address] [place]

## 2016-03-27 ENCOUNTER — Telehealth: Payer: Self-pay | Admitting: Family Medicine

## 2016-03-27 MED ORDER — ALPRAZOLAM 1 MG PO TABS: ORAL_TABLET | ORAL | Status: DC

## 2016-03-27 MED ORDER — PREDNISONE 20 MG PO TABS: ORAL_TABLET | ORAL | Status: DC

## 2016-03-27 NOTE — Telephone Encounter (Signed)
1 refill on prednisone, 3 refills on Xanax

## 2016-03-27 NOTE — Telephone Encounter (Signed)
Pt is needing a refill on his prednisone and his xanax.       [facility] [address] [place]

## 2016-03-27 NOTE — Telephone Encounter (Signed)
Prescriptions sent to pharmacy. Patient notified. ?

## 2016-03-29 ENCOUNTER — Telehealth: Payer: Self-pay | Admitting: Family Medicine

## 2016-03-29 NOTE — Telephone Encounter (Signed)
Patient needs Rx for ALPRAZolam Jeffrey Krueger(XANAX) 1 MG tablet.  The pharmacy states they have not got a refill on this yet.     [facility] [place]

## 2016-03-29 NOTE — Telephone Encounter (Signed)
Prescription re faxed to pharmacy. Patient notified. 

## 2016-05-09 ENCOUNTER — Other Ambulatory Visit: Payer: Self-pay | Admitting: Family Medicine

## 2016-05-18 ENCOUNTER — Telehealth: Payer: Self-pay | Admitting: Family Medicine

## 2016-05-18 MED ORDER — PREDNISONE 20 MG PO TABS: ORAL_TABLET | ORAL | 0 refills | Status: DC

## 2016-05-18 NOTE — Telephone Encounter (Signed)
Spoke with patient and informed him per Dr.Steve Luking- We are sending in a prednisone  Taper. Go to er if no significant  improvement cause other reasoons for sob are always possible. Patient verbalized understanding. Medication sent into pharmacy.

## 2016-05-18 NOTE — Telephone Encounter (Signed)
He said he should be in [place], [place] for 48 hours as far as he knows.

## 2016-05-18 NOTE — Telephone Encounter (Signed)
Spoke with patient and patient states that he has been having SOB on exertion, body aches, and headache. Patient states that he has SOB chronically but it has been worsening the past two weeks with the onset of the symptoms listed above. States prednisone usually helps with breathing. Please advise?

## 2016-05-18 NOTE — Telephone Encounter (Signed)
pred taper adult go to er if no sig improvement cause other reasoons for sob are always ossible

## 2016-05-18 NOTE — Telephone Encounter (Signed)
Patient requesting Rx for prednisone for his breathing.  He has been having issues for about 2 weeks.   [facility] [phone] [place]

## 2016-05-18 NOTE — Telephone Encounter (Signed)
ntsw need  A lot more details

## 2016-05-30 ENCOUNTER — Other Ambulatory Visit: Payer: Self-pay | Admitting: *Deleted

## 2016-05-30 MED ORDER — ALBUTEROL SULFATE (2.5 MG/3ML) 0.083% IN NEBU: INHALATION_SOLUTION | RESPIRATORY_TRACT | 0 refills | Status: DC

## 2016-07-03 ENCOUNTER — Telehealth: Payer: Self-pay | Admitting: Family Medicine

## 2016-07-03 NOTE — Telephone Encounter (Signed)
May have a prescription grip chin for prednisone 20 mg, #18,3qd for 3d then 2qd for 3d then 1qd for 3d Also may have 3 refills on Xanax

## 2016-07-03 NOTE — Telephone Encounter (Signed)
Requesting Rx [facility] pred[facility]one and xanax [facility] [address] [place].

## 2016-07-03 NOTE — Telephone Encounter (Signed)
Please call [phone] when done.

## 2016-07-04 MED ORDER — PREDNISONE 20 MG PO TABS: ORAL_TABLET | ORAL | 0 refills | Status: DC

## 2016-07-04 MED ORDER — ALPRAZOLAM 1 MG PO TABS: ORAL_TABLET | ORAL | 3 refills | Status: DC

## 2016-07-04 NOTE — Telephone Encounter (Signed)
Meds sent to pharmacy. Patient's wife Camelia Eng([name]) was notified.

## 2016-07-06 ENCOUNTER — Emergency Department (INDEPENDENT_AMBULATORY_CARE_PROVIDER_SITE_OTHER): Payer: Managed Care, Other (non HMO)

## 2016-07-06 ENCOUNTER — Emergency Department (INDEPENDENT_AMBULATORY_CARE_PROVIDER_SITE_OTHER)
Admission: EM | Admit: 2016-07-06 | Discharge: 2016-07-06 | Disposition: A | Discharge status: Home / Self Care | Payer: Managed Care, Other (non HMO) | Source: Home / Self Care | Attending: Family Medicine | Admitting: Family Medicine

## 2016-07-06 DIAGNOSIS — F17290 Nicotine dependence, other tobacco product, uncomplicated: Secondary | ICD-10-CM

## 2016-07-06 DIAGNOSIS — R0902 Hypoxemia: Secondary | ICD-10-CM

## 2016-07-06 DIAGNOSIS — J441 Chronic obstructive pulmonary disease with (acute) exacerbation: Secondary | ICD-10-CM

## 2016-07-06 DIAGNOSIS — R0602 Shortness of breath: Secondary | ICD-10-CM | POA: Diagnosis not present

## 2016-07-06 MED ORDER — DOXYCYCLINE HYCLATE 100 MG PO CAPS
100.0000 mg | ORAL_CAPSULE | Freq: Two times a day (BID) | ORAL | 0 refills | Status: DC
Start: 2016-07-06 — End: 2016-09-19

## 2016-07-06 MED ORDER — ALBUTEROL SULFATE (2.5 MG/3ML) 0.083% IN NEBU
5.0000 mg | INHALATION_SOLUTION | Freq: Once | RESPIRATORY_TRACT | Status: AC
  Administered 2016-07-06: 5 mg via RESPIRATORY_TRACT

## 2016-07-06 MED ORDER — METHYLPREDNISOLONE SODIUM SUCC 40 MG IJ SOLR
40.0000 mg | Freq: Once | INTRAMUSCULAR | Status: AC
  Administered 2016-07-06: 40 mg via INTRAMUSCULAR

## 2016-07-06 MED ORDER — PREDNISONE 20 MG PO TABS: ORAL_TABLET | ORAL | 0 refills | Status: DC

## 2016-07-06 NOTE — ED Triage Notes (Signed)
Pt O2 up to 92% after nebulizer and nasal oxygen.

## 2016-07-06 NOTE — ED Provider Notes (Signed)
CSN: [id number]     Arrival date & time 07/06/16  1039 History   First MD Initiated Contact with Patient 07/06/16 1048     Chief Complaint  Patient presents with  . Shortness of Breath   (Consider location/radiation/quality/duration/timing/severity/associated sxs/prior Treatment) HPI  MATEI MAGNONE is a 60 y.o. male presenting to UC with c/o worsening shortness of breath, cough, congestion, and chest tightness that started yesterday. He has a hx of COPD. He did a duoneb treatment last night and this morning without relief.  He notes he is getting sick and knew he needed to come in when home treatments weren't working.  He has been hospitalized in the past for COPD exacerbation, last time was about 1 year ago.  He is still a daily cigarette smoker, 2ppd.  Denies fever, chills, n/v/d.   O2 Sat in triage: 88%.  Pt is not on home oxygen.  Past Medical History:  Diagnosis Date  . COPD (chronic obstructive pulmonary disease) (HCC)   . Headache(784.0)   . Shortness of breath    Past Surgical History:  Procedure Laterality Date  . BACK SURGERY     No family history on file. Social History  Substance Use Topics  . Smoking status: Current Every Day Smoker    Packs/day: 2.00    Years: 44.00    Types: Cigarettes    Start date: 02/06/1974  . Smokeless tobacco: Never Used     Comment: Spoke with patient about his need to quit smoking.   . Alcohol use No    Review of Systems  Constitutional: Negative for chills and fever.  HENT: Positive for congestion. Negative for ear pain, sore throat, trouble swallowing and voice change.   Respiratory: Positive for cough, chest tightness, shortness of breath and wheezing.   Cardiovascular: Positive for chest pain (from cough and tightness). Negative for palpitations.  Gastrointestinal: Negative for abdominal pain, diarrhea, nausea and vomiting.  Musculoskeletal: Negative for arthralgias, back pain and myalgias.  Skin: Negative for rash.     Allergies  Review of patient's allergies indicates no known allergies.  Home Medications   Prior to Admission medications   Medication Sig Start Date End Date Taking? Authorizing Provider  albuterol (PROVENTIL HFA;VENTOLIN HFA) 108 (90 Base) MCG/ACT inhaler Inhale 1-2 puffs into the lungs every 6 (six) hours as needed for wheezing or shortness of breath. 02/24/16   Babs Sciara, MD  albuterol (PROVENTIL) (2.5 MG/3ML) 0.083% nebulizer solution inhale contents of 1 vial in nebulizer every 4 hours if needed for wheezing 02/24/16   Babs Sciara, MD  albuterol (PROVENTIL) (2.5 MG/3ML) 0.083% nebulizer solution inhale contents of 1 vial in nebulizer every 4 hours if needed for wheezing 05/30/16   Babs Sciara, MD  ALPRAZolam Prudy Feeler) 1 MG tablet 0.5 to 1 tid prn 07/04/16   Babs Sciara, MD  budesonide-formoterol (SYMBICORT) 160-4.5 MCG/ACT inhaler Inhale 2 puffs into the lungs 2 (two) times daily. 02/24/16   Babs Sciara, MD  budesonide-formoterol (SYMBICORT) 160-4.5 MCG/ACT inhaler Inhale 2 puffs into the lungs 2 (two) times daily. 02/24/16   Babs Sciara, MD  cefPROZIL (CEFZIL) 500 MG tablet Take 1 tablet (500 mg total) by mouth 2 (two) times daily. 01/24/16   Babs Sciara, MD  citalopram (CELEXA) 10 MG tablet Take 1 tablet (10 mg total) by mouth daily. 08/16/15 08/15/16  Babs Sciara, MD  cyclobenzaprine (FLEXERIL) 5 MG tablet 1 qhs prn spasms/ caution drowsiness 11/29/15   Babs Sciara,  MD  doxycycline (VIBRAMYCIN) 100 MG capsule Take 1 capsule (100 mg total) by mouth 2 (two) times daily. One po bid x 7 days 07/06/16   Junius Finner, PA-C  meloxicam (MOBIC) 7.5 MG tablet TAKE 1 TABLET BY MOUTH EVERY DAY 05/09/16   Babs Sciara, MD  predniSONE (DELTASONE) 20 MG tablet 3qd for 3d then 2qd for 3d then 1qd for 3d 07/04/16   Babs Sciara, MD  predniSONE (DELTASONE) 20 MG tablet 3 tabs po day one, then 2 po daily x 4 days 07/06/16   Junius Finner, PA-C  UNABLE TO FIND Oxygen 2 liters     Historical Provider, MD  valACYclovir (VALTREX) 1000 MG tablet 2 pils bid for 1 day for cold sore Patient not taking: Reported on 01/24/2016 10/22/15   Babs Sciara, MD   Meds Ordered and Administered this Visit   Medications  methylPREDNISolone sodium succinate (SOLU-MEDROL) 40 mg/mL injection 40 mg (40 mg Intramuscular Given 07/06/16 1101)  albuterol (PROVENTIL) (2.5 MG/3ML) 0.083% nebulizer solution 5 mg (5 mg Nebulization Given 07/06/16 1101)  albuterol (PROVENTIL) (2.5 MG/3ML) 0.083% nebulizer solution 5 mg (5 mg Nebulization Given 07/06/16 1126)    BP 139/78 (BP Location: Right Arm)   Pulse 100   Temp 98 F (36.7 C) (Oral)   SpO2 93%  No data found.   Physical Exam  Constitutional: He appears well-developed and well-nourished. He appears distressed.  Pt has audible wheeze, in tri-pod position. Difficulty breathing.  HENT:  Head: Normocephalic and atraumatic.  Right Ear: Tympanic membrane normal.  Left Ear: Tympanic membrane normal.  Nose: Nose normal.  Mouth/Throat: Uvula is midline, oropharynx is clear and moist and mucous membranes are normal.  Eyes: Conjunctivae are normal. No scleral icterus.  Neck: Normal range of motion. Neck supple.  Cardiovascular: Normal rate, regular rhythm and normal heart sounds.   Pulmonary/Chest: He is in respiratory distress. He has decreased breath sounds in the right lower field and the left lower field. He has wheezes. He has no rales. He exhibits no tenderness.  Diffuse inspiratory and expiratory wheeze with decreased lung sounds in lower lung fields. Mild to moderate respiratory distress, pt initially in a tri-pod position.  Abdominal: Soft. He exhibits no distension. There is no tenderness.  Musculoskeletal: Normal range of motion.  Neurological: He is alert.  Skin: Skin is warm and dry. He is not diaphoretic.  Nursing note and vitals reviewed.   Urgent Care Course   Clinical Course    Procedures (including critical care  time)  Labs Review Labs Reviewed - No data to display  Imaging Review Dg Chest 2 View  Result Date: 07/06/2016 CLINICAL DATA:  Shortness of breath for several weeks EXAM: CHEST  2 VIEW COMPARISON:  09/20/2013, 08/13/2015 FINDINGS: Cardiac shadow is within normal limits. A nodular density is again noted overlying the right mid lung consistent with nipple shadow based on recent CT examination. No other focal infiltrate or sizable effusion is noted. No acute bony abnormality is seen. IMPRESSION: No active cardiopulmonary disease. Electronically Signed   By: Alcide Clever M.D.   On: 07/06/2016 11:16     MDM   1. COPD exacerbation (HCC)   2. Hypoxia   3. Cigar smoker     Pt presenting to UC with COPD exacerbation that started yesterday.  Pt has already done TWO duoneb treatments at home, one last night and one this morning w/o relief.  Tx in UC: Solumedrol 40mg  IM and albuterol nebulizer tx Improvement in O2  as it went from 88% on RA to 93% on RA.   CXR: no active cardiopulmonary disease.  Pt initially appeared better after getting back from CXR, but wheeze still throughout. Will give second albuterol nebulizer tx.  Pt still has significant wheeze but states he feels better and does not want to go to the hospital.   Pt's O2 drops to 89% on RA while ambulating.  Strongly encouraged pt to go to emergency department for further evaluation, treatment and monitoring as he will likely benefit from admission. Pt accompanied by his wife. Pt declined. Signed refusal of transfer form. Pt states if he does not feel better tomorrow he will go. Advised not to hesitate to go today, especially if worsening. Rx: Prednisone and Doxycycline    Junius Finnerrin O'Malley, PA-C 07/06/16 1228

## 2016-07-06 NOTE — ED Triage Notes (Signed)
Pt c/o SOB and  cough with mucous that started yesterday. He has COPD.

## 2016-07-07 ENCOUNTER — Encounter (HOSPITAL_COMMUNITY): Payer: Self-pay | Admitting: Emergency Medicine

## 2016-07-07 ENCOUNTER — Emergency Department (HOSPITAL_COMMUNITY): Payer: Managed Care, Other (non HMO)

## 2016-07-07 ENCOUNTER — Emergency Department (HOSPITAL_COMMUNITY)
Admission: EM | Admit: 2016-07-07 | Discharge: 2016-07-08 | Disposition: A | Discharge status: Home / Self Care | Payer: Managed Care, Other (non HMO) | Attending: Emergency Medicine | Admitting: Emergency Medicine

## 2016-07-07 DIAGNOSIS — F17210 Nicotine dependence, cigarettes, uncomplicated: Secondary | ICD-10-CM | POA: Insufficient documentation

## 2016-07-07 DIAGNOSIS — R0602 Shortness of breath: Secondary | ICD-10-CM | POA: Diagnosis present

## 2016-07-07 DIAGNOSIS — J441 Chronic obstructive pulmonary disease with (acute) exacerbation: Secondary | ICD-10-CM | POA: Diagnosis not present

## 2016-07-07 LAB — BASIC METABOLIC PANEL
ANION GAP: 10 (ref 5–15)
BUN: 26 mg/dL — AB (ref 6–20)
CO2: 24 mmol/L (ref 22–32)
Calcium: 8.9 mg/dL (ref 8.9–10.3)
Chloride: 104 mmol/L (ref 101–111)
Creatinine, Ser: 1.13 mg/dL (ref 0.61–1.24)
Glucose, Bld: 183 mg/dL — ABNORMAL HIGH (ref 65–99)
POTASSIUM: 3.9 mmol/L (ref 3.5–5.1)
SODIUM: 138 mmol/L (ref 135–145)

## 2016-07-07 LAB — CBC
HEMATOCRIT: 44.2 % (ref 39.0–52.0)
Hemoglobin: 15.2 g/dL (ref 13.0–17.0)
MCH: 32.2 pg (ref 26.0–34.0)
MCHC: 34.4 g/dL (ref 30.0–36.0)
MCV: 93.6 fL (ref 78.0–100.0)
Platelets: 216 10*3/uL (ref 150–400)
RBC: 4.72 MIL/uL (ref 4.22–5.81)
RDW: 12.7 % (ref 11.5–15.5)
WBC: 23.3 10*3/uL — AB (ref 4.0–10.5)

## 2016-07-07 LAB — I-STAT TROPONIN, ED: Troponin i, poc: 0 ng/mL (ref 0.00–0.08)

## 2016-07-07 NOTE — ED Triage Notes (Addendum)
C/o increased sob, productive cough with yellowish-green sputum, pain across chest, and dry heaves over the past 2 days.   States [facility] and declined transport to hospital.  Pt taking doxycycline and prednisone.  Speaking in complete sentences.

## 2016-07-08 LAB — BRAIN NATRIURETIC PEPTIDE: B Natriuretic Peptide: 39.8 pg/mL (ref 0.0–100.0)

## 2016-07-08 MED ORDER — GUAIFENESIN-CODEINE 100-10 MG/5ML PO SOLN: 5.0000 mL | Freq: Three times a day (TID) | ORAL | 0 refills | Status: DC | PRN

## 2016-07-08 MED ORDER — ALBUTEROL SULFATE (2.5 MG/3ML) 0.083% IN NEBU
5.0000 mg | INHALATION_SOLUTION | Freq: Once | RESPIRATORY_TRACT | Status: AC
  Administered 2016-07-08: 5 mg via RESPIRATORY_TRACT
  Filled 2016-07-08: qty 6

## 2016-07-08 MED ORDER — IPRATROPIUM BROMIDE 0.02 % IN SOLN
0.5000 mg | Freq: Once | RESPIRATORY_TRACT | Status: AC
  Administered 2016-07-08: 0.5 mg via RESPIRATORY_TRACT
  Filled 2016-07-08: qty 2.5

## 2016-07-08 MED ORDER — METHYLPREDNISOLONE SODIUM SUCC 125 MG IJ SOLR
125.0000 mg | Freq: Once | INTRAMUSCULAR | Status: AC
  Administered 2016-07-08: 125 mg via INTRAVENOUS
  Filled 2016-07-08: qty 2

## 2016-07-08 MED ORDER — BENZONATATE 100 MG PO CAPS: 200.0000 mg | ORAL_CAPSULE | Freq: Two times a day (BID) | ORAL | 0 refills | Status: DC | PRN

## 2016-07-08 NOTE — ED Provider Notes (Signed)
[facility]EMER[facility]ENCY DEPT Provider Note   CSN: [id number] Arrival date & time: 07/07/16  2242     History   Chief Complaint Chief Complaint  Patient presents with  . Shortness of Breath    HPI Jeffrey Krueger is a 60 y.o. male.  HPI   Patient is a 60 year old male with history of COPD he presents to the emergency department complaining of worsening productive cough with yellow-green sputum, increased relative to his baseline, with worsening shortness of breath, unrelieved with home nebulizers, steroids for the past 3 days, and doxycycline for one day.  He reports his cough began only 3 days ago and had associated headache which resolved.  His PCP who prescribed him a prednisone burst 3 days ago. He was seen 2 days later at urgent care where he was given a steroid injection, doxycycline and was encouraged to go to the ER for admission because of exertional shortness of breath and hypoxia.  He declined transportation to the hospital and instead presented today when he had not improved.  Patient states he is having hot and cold chills, sweats, muscle spasms in his chest and ribs that are intermittent. He states that the cramps and spasms, usually when he is sitting down at rest and he'll have to stand and stretch to get them to improve.  He currently has no chest pain at rest, no pain with deep inspiration, however his chest wall is tender if he presses on it.  He denies any exertional chest pain. He states that he can only walk short distances and then has to stop and catch his breath. He denies any URI symptoms.  He does complain of mild bilateral lower extremity edema, he denies orthopnea. He has audible wheeze which is not improved with his nebulizer.  He denies rash, sick contacts, weight loss, nausea, vomiting. Patient states that his COPD he is currently maintained on nebulizers, rescue inhalers and Symbicort. He does have home oxygen tank but states he is not chronically on home oxygen.  He is a  current smoker.  He denies any recent antibiotic use, states his last COPD flare was 6-7 months ago  Past Medical History:  Diagnosis Date  . COPD (chronic obstructive pulmonary disease) (HCC)   . Headache(784.0)   . Shortness of breath     Patient Active Problem List   Diagnosis Date Noted  . Nocturnal hypoxia 10/02/2013  . Respiratory distress 09/21/2013  . Sciatica of right side 09/05/2013  . COPD exacerbation (HCC) 05/28/2013  . Solitary pulmonary nodule 05/28/2013  . Tobacco abuse 05/28/2013    Past Surgical History:  Procedure Laterality Date  . BACK SURGERY         Home Medications    Prior to Admission medications   Medication Sig Start Date End Date Taking? Authorizing Provider  albuterol (PROVENTIL HFA;VENTOLIN HFA) 108 (90 Base) MCG/ACT inhaler Inhale 1-2 puffs into the lungs every 6 (six) hours as needed for wheezing or shortness of breath. 02/24/16  Yes Babs Sciara, MD  albuterol (PROVENTIL) (2.5 MG/3ML) 0.083% nebulizer solution inhale contents of 1 vial in nebulizer every 4 hours if needed for wheezing 02/24/16  Yes Babs Sciara, MD  ALPRAZolam (XANAX) 1 MG tablet 0.5 to 1 tid prn Patient taking differently: Take 1 mg by mouth at bedtime as needed for sleep.  07/04/16  Yes Babs Sciara, MD  budesonide-formoterol (SYMBICORT) 160-4.5 MCG/ACT inhaler Inhale 2 puffs into the lungs 2 (two) times daily. 02/24/16  Yes Scott A  Gerda DissLuking, MD  doxycycline (VIBRAMYCIN) 100 MG capsule Take 1 capsule (100 mg total) by mouth 2 (two) times daily. One po bid x 7 days Patient taking differently: Take 100 mg by mouth 2 (two) times daily. For 7 days 07/06/16  Yes Junius FinnerErin O'Malley, PA-C  Misc Natural Products (SINUS FORMULA) TABS Take 1 tablet by mouth every 8 (eight) hours as needed (for sinus congestion).   Yes Historical Provider, MD  predniSONE (DELTASONE) 20 MG tablet 3qd for 3d then 2qd for 3d then 1qd for 3d Patient taking differently: Take 20-60 mg by mouth See admin  instructions. 60 mg once a day for three days then 40 mg once a day for three days then 20 mg once a day for three days 07/04/16  Yes Babs SciaraScott A Luking, MD  UNABLE TO FIND Oxygen 2 liters   Yes Historical Provider, MD  albuterol (PROVENTIL) (2.5 MG/3ML) 0.083% nebulizer solution inhale contents of 1 vial in nebulizer every 4 hours if needed for wheezing Patient not taking: Reported on 07/08/2016 05/30/16   Babs SciaraScott A Luking, MD  benzonatate (TESSALON) 100 MG capsule Take 2 capsules (200 mg total) by mouth 2 (two) times daily as needed for cough. 07/08/16   Danelle BerryLeisa Jinelle Butchko, PA-C  budesonide-formoterol (SYMBICORT) 160-4.5 MCG/ACT inhaler Inhale 2 puffs into the lungs 2 (two) times daily. Patient not taking: Reported on 07/08/2016 02/24/16   Babs SciaraScott A Luking, MD  cefPROZIL (CEFZIL) 500 MG tablet Take 1 tablet (500 mg total) by mouth 2 (two) times daily. Patient not taking: Reported on 07/08/2016 01/24/16   Babs SciaraScott A Luking, MD  citalopram (CELEXA) 10 MG tablet Take 1 tablet (10 mg total) by mouth daily. Patient not taking: Reported on 07/08/2016 08/16/15 08/15/16  Babs SciaraScott A Luking, MD  cyclobenzaprine (FLEXERIL) 5 MG tablet 1 qhs prn spasms/ caution drowsiness Patient not taking: Reported on 07/08/2016 11/29/15   Babs SciaraScott A Luking, MD  guaiFENesin-codeine 100-10 MG/5ML syrup Take 5 mLs by mouth 3 (three) times daily as needed for cough. 07/08/16   Danelle BerryLeisa Lisett Dirusso, PA-C  meloxicam (MOBIC) 7.5 MG tablet TAKE 1 TABLET BY MOUTH EVERY DAY Patient not taking: Reported on 07/08/2016 05/09/16   Babs SciaraScott A Luking, MD  predniSONE (DELTASONE) 20 MG tablet 3 tabs po day one, then 2 po daily x 4 days Patient not taking: Reported on 07/08/2016 07/06/16   Junius FinnerErin O'Malley, PA-C  valACYclovir (VALTREX) 1000 MG tablet 2 pils bid for 1 day for cold sore Patient not taking: Reported on 07/08/2016 10/22/15   Babs SciaraScott A Luking, MD    Family History No family history on file.  Social History Social History  Substance Use Topics  . Smoking status: Current  Every Day Smoker    Packs/day: 2.00    Years: 44.00    Types: Cigarettes    Start date: 02/06/1974  . Smokeless tobacco: Never Used     Comment: Spoke with patient about his need to quit smoking.   . Alcohol use No     Allergies   Review of patient's allergies indicates no known allergies.   Review of Systems Review of Systems  All other systems reviewed and are negative.    Physical Exam Updated Vital Signs BP 131/76 (BP Location: Right Arm)   Pulse 83   Temp 97.9 F (36.6 C) (Oral)   Resp 18   SpO2 96%   Physical Exam  Constitutional: He is oriented to person, place, and time. He appears well-developed and well-nourished. No distress.  HENT:  Head: Normocephalic and  atraumatic.  Right Ear: External ear normal.  Left Ear: External ear normal.  Nose: Nose normal.  Mouth/Throat: Oropharynx is clear and moist. No oropharyngeal exudate.  Eyes: Conjunctivae and EOM are normal. Pupils are equal, round, and reactive to light. Right eye exhibits no discharge. Left eye exhibits no discharge. No scleral icterus.  Neck: Normal range of motion. Neck supple.  Cardiovascular: Normal rate, regular rhythm, normal heart sounds and intact distal pulses.  PMI is not displaced.  Exam reveals no gallop and no friction rub.   No murmur heard. Pulses:      Radial pulses are 2+ on the right side, and 2+ on the left side.  No lower extremity edema  Pulmonary/Chest: Accessory muscle usage present. No stridor. Tachypnea noted. No respiratory distress. He has decreased breath sounds. He has wheezes. He has no rales. He exhibits tenderness.  Patient tachypneic with intercostal retractions and accessory muscle use, scattered rhonchi, diminished breath sounds bilaterally the bases with diffuse prolonged expiratory wheeze, chest wall nontender to palpation  Abdominal: Soft. Bowel sounds are normal. He exhibits no distension and no mass. There is no tenderness. There is no guarding.  Musculoskeletal:  Normal range of motion. He exhibits no edema.  Lymphadenopathy:    He has no cervical adenopathy.  Neurological: He is alert and oriented to person, place, and time. He exhibits normal muscle tone. Coordination normal.  Skin: Skin is warm and dry. Capillary refill takes less than 2 seconds. No rash noted. He is not diaphoretic. No erythema. No pallor.  Psychiatric: He has a normal mood and affect. His behavior is normal. Judgment and thought content normal.  Nursing note and vitals reviewed.    ED Treatments / Results  Labs (all labs ordered are listed, but only abnormal results are displayed) Labs Reviewed  BASIC METABOLIC PANEL - Abnormal; Notable for the following:       Result Value   Glucose, Bld 183 (*)    BUN 26 (*)    All other components within normal limits  CBC - Abnormal; Notable for the following:    WBC 23.3 (*)    All other components within normal limits  CULTURE, BLOOD (ROUTINE X 2)  CULTURE, BLOOD (ROUTINE X 2)  BRAIN NATRIURETIC PEPTIDE  I-STAT TROPOININ, ED    EKG  EKG Interpretation None       Radiology Dg Chest 2 View  Result Date: 07/07/2016 CLINICAL DATA:  60 y/o M; shortness of breath and cough starting yesterday. History of COPD. EXAM: CHEST  2 VIEW COMPARISON:  07/06/2016 chest radiograph FINDINGS: Stable cardiomediastinal silhouette within normal limits given differences in technique and projection. No focal consolidation. Hyperinflated lungs with flattened diaphragms consistent with history of COPD. Mild degenerative changes of the thoracic spine. IMPRESSION: No acute cardiopulmonary process identified. Hyperinflated lungs consistent with history of COPD. Electronically Signed   By: Mitzi Hansen M.D.   On: 07/07/2016 23:11   Dg Chest 2 View  Result Date: 07/06/2016 CLINICAL DATA:  Shortness of breath for several weeks EXAM: CHEST  2 VIEW COMPARISON:  09/20/2013, 08/13/2015 FINDINGS: Cardiac shadow is within normal limits. A nodular  density is again noted overlying the right mid lung consistent with nipple shadow based on recent CT examination. No other focal infiltrate or sizable effusion is noted. No acute bony abnormality is seen. IMPRESSION: No active cardiopulmonary disease. Electronically Signed   By: Alcide Clever M.D.   On: 07/06/2016 11:16    Procedures Procedures (including critical care time)  Medications Ordered in ED Medications  methylPREDNISolone sodium succinate (SOLU-MEDROL) 125 mg/2 mL injection 125 mg (125 mg Intravenous Given 07/08/16 0055)  ipratropium (ATROVENT) nebulizer solution 0.5 mg (0.5 mg Nebulization Given 07/08/16 0055)  albuterol (PROVENTIL) (2.5 MG/3ML) 0.083% nebulizer solution 5 mg (5 mg Nebulization Given 07/08/16 0055)  albuterol (PROVENTIL) (2.5 MG/3ML) 0.083% nebulizer solution 5 mg (5 mg Nebulization Given 07/08/16 0214)  ipratropium (ATROVENT) nebulizer solution 0.5 mg (0.5 mg Nebulization Given 07/08/16 0214)     Initial Impression / Assessment and Plan / ED Course  I have reviewed the triage vital signs and the nursing notes.  Pertinent labs & imaging results that were available during my care of the patient were reviewed by me and considered in my medical decision making (see chart for details).  Clinical Course  Patient was COPD exacerbation for the past 4 days, he has been on a steroid taper for 3 days, doxycycline for one day, continued to feel short of breath and wheezy, and came to the ER for evaluation. He was evaluated yesterday by urgent care and they told him to come to the ER for admission however he refused transportation.  The ER he has diffuse expiratory wheezes mildly tachypneic with intercostal retractions. He is ambulate in the ER and maintain oxygen saturation above 90%.  Chest x-ray is negative.  He is hemodynamically stable here in the ER and is maintaining oxygen saturation in 95% when at rest in the ER gurney.  He was given IV steroids and multiple duo  nebs.  He does have home O2 available.  He has only taken 2 doses of doxycycline and therefore has not have outpatient failure yet and does not currently meet inpatient criteria.  We'll give patient Zofran for his dry heaving, he should continue doxycycline and follow-up with his PCP in 2 days. He'll be encouraged to return to the ER if not improving with continued outpatient antibiotic treatment.  Vitals:   07/08/16 0230 07/08/16 0300 07/08/16 0306 07/08/16 0317  BP: 127/77 123/79 123/79 131/76  Pulse: 82 91 94 83  Resp: 21 23 19 18   Temp:    97.9 F (36.6 C)  TempSrc:    Oral  SpO2: 99% 94% 93% 96%     Final Clinical Impressions(s) / ED Diagnoses   Final diagnoses:  COPD exacerbation (HCC)    New Prescriptions Discharge Medication List as of 07/08/2016  2:43 AM    START taking these medications   Details  benzonatate (TESSALON) 100 MG capsule Take 2 capsules (200 mg total) by mouth 2 (two) times daily as needed for cough., Starting Sat 07/08/2016, Print    guaiFENesin-codeine 100-10 MG/5ML syrup Take 5 mLs by mouth 3 (three) times daily as needed for cough., Starting Sat 07/08/2016, Print         Danelle Berry, PA-C 07/08/16 0981    Geoffery Lyons, MD 07/09/16 [phone]

## 2016-07-08 NOTE — Discharge Instructions (Signed)
Continue to take the steroid taper and doxycycline as prescribed.  Continue your daily COPD medications and use your rescue inhaler or nebulizer every 4 hours.

## 2016-07-08 NOTE — ED Notes (Signed)
Patient verbalized understanding of discharge instructions and denies any further needs or questions at this time. VS stable. Patient ambulatory with steady gait, escorted to ED entrance in wheelchair.   

## 2016-07-08 NOTE — ED Notes (Signed)
Pt maintained SpO2 between 90-96% on RA while ambulating around Pod D and back.

## 2016-07-12 ENCOUNTER — Ambulatory Visit (INDEPENDENT_AMBULATORY_CARE_PROVIDER_SITE_OTHER): Payer: Managed Care, Other (non HMO) | Admitting: Family Medicine

## 2016-07-12 ENCOUNTER — Encounter: Payer: Self-pay | Admitting: Family Medicine

## 2016-07-12 VITALS — BP 140/80 | Temp 98.1°F | Ht 69.0 in | Wt 160.5 lb

## 2016-07-12 DIAGNOSIS — J441 Chronic obstructive pulmonary disease with (acute) exacerbation: Secondary | ICD-10-CM | POA: Diagnosis not present

## 2016-07-12 DIAGNOSIS — J019 Acute sinusitis, unspecified: Secondary | ICD-10-CM | POA: Diagnosis not present

## 2016-07-12 MED ORDER — AZITHROMYCIN 250 MG PO TABS: ORAL_TABLET | ORAL | 0 refills | Status: DC

## 2016-07-12 MED ORDER — PREDNISONE 20 MG PO TABS: ORAL_TABLET | ORAL | 0 refills | Status: DC

## 2016-07-12 NOTE — Progress Notes (Signed)
   Subjective:    Patient ID: Sanjuana MaeBobby J Johnsen, male    DOB: 1955-11-13, 60 y.o.   MRN: [id number]  Emesis   This is a new problem. The current episode started 1 to 4 weeks ago. The problem occurs intermittently. The problem has been unchanged. The emesis has an appearance of stomach contents. There has been no fever. Associated symptoms include coughing, headaches and myalgias. Associated symptoms comments: congestion. Treatments tried: Patient received meds at ER. The treatment provided no relief.  [facility].   Bad headache four d  Body ache and muscle aches  And feeloing poorly  Current med not helping  Came on couple weeks ago . Got really sick  Finishing pred  Review of Systems  Respiratory: Positive for cough.   Gastrointestinal: Positive for vomiting.  Musculoskeletal: Positive for myalgias.  Neurological: Positive for headaches.       Objective:   Physical Exam Alert moderate malaise hydration good H&T some frontal tenderness pharynx normal lungs lateral wheezes heart regular rhythm       Assessment & Plan:  Impression sinusitis bronchitis with exacerbation of COPD/ reactive airways plan antibiotics prescribed. Steroids prescribed. Patient encouraged to increase albuterol to 2 sprays every 4 hours warning signs discussed

## 2016-07-13 LAB — CULTURE, BLOOD (ROUTINE X 2)
Culture: NO GROWTH
Culture: NO GROWTH

## 2016-07-26 ENCOUNTER — Ambulatory Visit: Payer: Managed Care, Other (non HMO) | Admitting: Family Medicine

## 2016-08-04 ENCOUNTER — Ambulatory Visit: Payer: Managed Care, Other (non HMO) | Admitting: Nurse Practitioner

## 2016-08-11 ENCOUNTER — Encounter: Payer: Self-pay | Admitting: Acute Care

## 2016-08-22 ENCOUNTER — Telehealth: Payer: Self-pay | Admitting: *Deleted

## 2016-08-22 NOTE — Telephone Encounter (Signed)
Left message to return call. See form at nurse's station. Form for oxygen needs to be filled out. Pt has not had 02 sat and abg in the past 9 months. Needs office if he wants to keep oxygen.

## 2016-08-22 NOTE — Telephone Encounter (Signed)
Spoke with patient and patient stated that he does not use his oxygen at all so he is in the process of contacting the company to pick up oxygen tanks.

## 2016-09-06 ENCOUNTER — Telehealth: Payer: Self-pay | Admitting: Family Medicine

## 2016-09-06 NOTE — Telephone Encounter (Signed)
The patient should do a follow-up visit once he is back in [place] . If possible please call ahead to schedule this. In addition to this make sure patient brings all of his medications

## 2016-09-06 NOTE — Telephone Encounter (Signed)
[name] just wanted to make you aware that Jeffrey Krueger is in [place] in the hospital for a few days due to pneumonia.  His oxygen was down to 83 and borderline by the time he got to the ER.

## 2016-09-11 ENCOUNTER — Ambulatory Visit: Payer: Managed Care, Other (non HMO) | Admitting: Family Medicine

## 2016-09-19 ENCOUNTER — Ambulatory Visit (INDEPENDENT_AMBULATORY_CARE_PROVIDER_SITE_OTHER): Payer: Managed Care, Other (non HMO) | Admitting: Family Medicine

## 2016-09-19 ENCOUNTER — Encounter: Payer: Self-pay | Admitting: Family Medicine

## 2016-09-19 VITALS — BP 132/82 | Ht 69.0 in | Wt 170.0 lb

## 2016-09-19 DIAGNOSIS — J441 Chronic obstructive pulmonary disease with (acute) exacerbation: Secondary | ICD-10-CM

## 2016-09-19 DIAGNOSIS — R600 Localized edema: Secondary | ICD-10-CM | POA: Diagnosis not present

## 2016-09-19 MED ORDER — ALBUTEROL SULFATE HFA 108 (90 BASE) MCG/ACT IN AERS: 1.0000 | INHALATION_SPRAY | Freq: Four times a day (QID) | RESPIRATORY_TRACT | 5 refills | Status: DC | PRN

## 2016-09-19 MED ORDER — PREDNISONE 20 MG PO TABS: ORAL_TABLET | ORAL | 0 refills | Status: DC

## 2016-09-19 MED ORDER — VARENICLINE TARTRATE 0.5 MG X 11 & 1 MG X 42 PO MISC: ORAL | 0 refills | Status: DC

## 2016-09-19 MED ORDER — BUDESONIDE-FORMOTEROL FUMARATE 160-4.5 MCG/ACT IN AERO: 2.0000 | INHALATION_SPRAY | Freq: Two times a day (BID) | RESPIRATORY_TRACT | 5 refills | Status: DC

## 2016-09-19 NOTE — Patient Instructions (Signed)
Steps to Quit Smoking Smoking tobacco can be harmful to your health and can affect almost every organ in your body. Smoking puts you, and those around you, at risk for developing many serious chronic diseases. Quitting smoking is difficult, but it is one of the best things that you can do for your health. It is never too late to quit. What are the benefits of quitting smoking? When you quit smoking, you lower your risk of developing serious diseases and conditions, such as:  Lung cancer or lung disease, such as COPD.  Heart disease.  Stroke.  Heart attack.  Infertility.  Osteoporosis and bone fractures.  Additionally, symptoms such as coughing, wheezing, and shortness of breath may get better when you quit. You may also find that you get sick less often because your body is stronger at fighting off colds and infections. If you are pregnant, quitting smoking can help to reduce your chances of having a baby of low birth weight. How do I get ready to quit? When you decide to quit smoking, create a plan to make sure that you are successful. Before you quit:  Pick a date to quit. Set a date within the next two weeks to give you time to prepare.  Write down the reasons why you are quitting. Keep this list in places where you will see it often, such as on your bathroom mirror or in your car or wallet.  Identify the people, places, things, and activities that make you want to smoke (triggers) and avoid them. Make sure to take these actions: ? Throw away all cigarettes at home, at work, and in your car. ? Throw away smoking accessories, such as ashtrays and lighters. ? Clean your car and make sure to empty the ashtray. ? Clean your home, including curtains and carpets.  Tell your family, friends, and coworkers that you are quitting. Support from your loved ones can make quitting easier.  Talk with your health care provider about your options for quitting smoking.  Find out what treatment  options are covered by your health insurance.  What strategies can I use to quit smoking? Talk with your healthcare provider about different strategies to quit smoking. Some strategies include:  Quitting smoking altogether instead of gradually lessening how much you smoke over a period of time. Research shows that quitting "cold turkey" is more successful than gradually quitting.  Attending in-person counseling to help you build problem-solving skills. You are more likely to have success in quitting if you attend several counseling sessions. Even short sessions of 10 minutes can be effective.  Finding resources and support systems that can help you to quit smoking and remain smoke-free after you quit. These resources are most helpful when you use them often. They can include: ? Online chats with a counselor. ? Telephone quitlines. ? Printed self-help materials. ? Support groups or group counseling. ? Text messaging programs. ? Mobile phone applications.  Taking medicines to help you quit smoking. (If you are pregnant or breastfeeding, talk with your health care provider first.) Some medicines contain nicotine and some do not. Both types of medicines help with cravings, but the medicines that include nicotine help to relieve withdrawal symptoms. Your health care provider may recommend: ? Nicotine patches, gum, or lozenges. ? Nicotine inhalers or sprays. ? Non-nicotine medicine that is taken by mouth.  Talk with your health care provider about combining strategies, such as taking medicines while you are also receiving in-person counseling. Using these two strategies together   makes you more likely to succeed in quitting than if you used either strategy on its own. If you are pregnant or breastfeeding, talk with your health care provider about finding counseling or other support strategies to quit smoking. Do not take medicine to help you quit smoking unless told to do so by your health care  provider. What things can I do to make it easier to quit? Quitting smoking might feel overwhelming at first, but there is a lot that you can do to make it easier. Take these important actions:  Reach out to your family and friends and ask that they support and encourage you during this time. Call telephone quitlines, reach out to support groups, or work with a counselor for support.  Ask people who smoke to avoid smoking around you.  Avoid places that trigger you to smoke, such as bars, parties, or smoke-break areas at work.  Spend time around people who do not smoke.  Lessen stress in your life, because stress can be a smoking trigger for some people. To lessen stress, try: ? Exercising regularly. ? Deep-breathing exercises. ? Yoga. ? Meditating. ? Performing a body scan. This involves closing your eyes, scanning your body from head to toe, and noticing which parts of your body are particularly tense. Purposefully relax the muscles in those areas.  Download or purchase mobile phone or tablet apps (applications) that can help you stick to your quit plan by providing reminders, tips, and encouragement. There are many free apps, such as QuitGuide from the CDC (Centers for Disease Control and Prevention). You can find other support for quitting smoking (smoking cessation) through smokefree.gov and other websites.  How will I feel when I quit smoking? Within the first 24 hours of quitting smoking, you may start to feel some withdrawal symptoms. These symptoms are usually most noticeable 2-3 days after quitting, but they usually do not last beyond 2-3 weeks. Changes or symptoms that you might experience include:  Mood swings.  Restlessness, anxiety, or irritation.  Difficulty concentrating.  Dizziness.  Strong cravings for sugary foods in addition to nicotine.  Mild weight gain.  Constipation.  Nausea.  Coughing or a sore throat.  Changes in how your medicines work in your  body.  A depressed mood.  Difficulty sleeping (insomnia).  After the first 2-3 weeks of quitting, you may start to notice more positive results, such as:  Improved sense of smell and taste.  Decreased coughing and sore throat.  Slower heart rate.  Lower blood pressure.  Clearer skin.  The ability to breathe more easily.  Fewer sick days.  Quitting smoking is very challenging for most people. Do not get discouraged if you are not successful the first time. Some people need to make many attempts to quit before they achieve long-term success. Do your best to stick to your quit plan, and talk with your health care provider if you have any questions or concerns. This information is not intended to replace advice given to you by your health care provider. Make sure you discuss any questions you have with your health care provider. Document Released: 09/05/2001 Document Revised: 05/09/2016 Document Reviewed: 01/26/2015 Elsevier Interactive Patient Education  2017 [employer]  

## 2016-09-19 NOTE — Progress Notes (Signed)
   Subjective:    Patient ID: Jeffrey Krueger, male    DOB: [birth year], 60 y.o.   MRN: [id number]  HPI Patient arrives for a hospital follow up for left leg pain and swelling. Patient states he was given prednisone, antibiotics and fluid pill while in hospital in [place]. This gentleman is been having some swelling issues in both legs. He also has severe lung disease with COPD and uses his Symbicort on a regular basis but recently was admitted into a hospital in [place] because of his severe COPD flareup he states while he was here they treated him for a cellulitis of the leg with an antibiotic they also did ultrasound which was negative for blood clot. He has some residual swelling in both legs. Not severe. Denies chest pains but does have severe COPD and gets short of breath easily. Review of Systems     Objective:   Physical Exam  Lungs he is a thin person no wheezes or crackles heart regular pulse normal neck no masses nontender a little bit of edema in both ankles but not much it is mainly wear his boots make a crease in his ankles.      Assessment & Plan:  COPD-Symbicort twice daily recommended highly along with albuterol every 3-4 hours as needed  Patient was counseled to quit smoking Chantix. Warnings regarding Chantix given. In addition to this very important to quit smoking the lessen the risk of terminal illness  Patient continues to work but he is finding it harder to be active as he should be with this. This patient may well need to come out on disability. 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 He will get a new PFT pre-imposed.  Swelling in both legs no calf tenderness he is on a diuretic. He will get the medical records from [facility] sent to us he has signed a release today  Follow-up by spring sooner if problems.

## 2016-10-23 ENCOUNTER — Telehealth: Payer: Self-pay | Admitting: Family Medicine

## 2016-10-23 MED ORDER — ALPRAZOLAM 1 MG PO TABS: ORAL_TABLET | ORAL | 1 refills | Status: DC

## 2016-10-23 MED ORDER — HYDROCHLOROTHIAZIDE 25 MG PO TABS: 25.0000 mg | ORAL_TABLET | Freq: Every day | ORAL | 1 refills | Status: DC

## 2016-10-23 MED ORDER — ALBUTEROL SULFATE HFA 108 (90 BASE) MCG/ACT IN AERS: 1.0000 | INHALATION_SPRAY | Freq: Four times a day (QID) | RESPIRATORY_TRACT | 1 refills | Status: DC | PRN

## 2016-10-23 MED ORDER — PREDNISONE 20 MG PO TABS: ORAL_TABLET | ORAL | 0 refills | Status: DC

## 2016-10-23 NOTE — Telephone Encounter (Signed)
Left message on voicemail notifying patient that refills were sent to pharmacy.

## 2016-10-23 NOTE — Addendum Note (Signed)
Addended by: Dereck LigasJOHNSON, Jilda Kress P on: 10/23/2016 11:51 AM   Modules accepted: Orders

## 2016-10-23 NOTE — Telephone Encounter (Signed)
He may have a refill on all of these 1 will need office visit before further, HCTZ 25 mg 1 every morning #30

## 2016-10-23 NOTE — Telephone Encounter (Signed)
Xanax, Prednisone and HCTZ and Albuterol. [facility], [place]. PT states the HCTZS was given to him in the hospital in [place] but has not seen Dr. Lorin PicketScott about it yet. I advised that he may have to schedule an appt before receiving a refill.

## 2016-10-24 ENCOUNTER — Telehealth: Payer: Self-pay | Admitting: Family Medicine

## 2016-10-24 MED ORDER — ALBUTEROL SULFATE (2.5 MG/3ML) 0.083% IN NEBU: INHALATION_SOLUTION | RESPIRATORY_TRACT | 1 refills | Status: DC

## 2016-10-24 NOTE — Telephone Encounter (Signed)
Pt is needing a refill on his albuterol (PROVENTIL) (2.5 MG/3ML) 0.083% nebulizer solution   [id number] [place] [place] - [address]

## 2016-10-24 NOTE — Telephone Encounter (Signed)
Left message on voicemail notifying patient that med has been sent in.

## 2016-11-18 IMAGING — DX DG CHEST 2V
2 series · 2 of 2 positions shown · non-contrast
Comparison: 07/06/2016 chest radiograph

CLINICAL DATA: 60 y/o M; shortness of breath and cough starting
yesterday. History of COPD.

EXAM:
CHEST  2 VIEW

[chest pa]
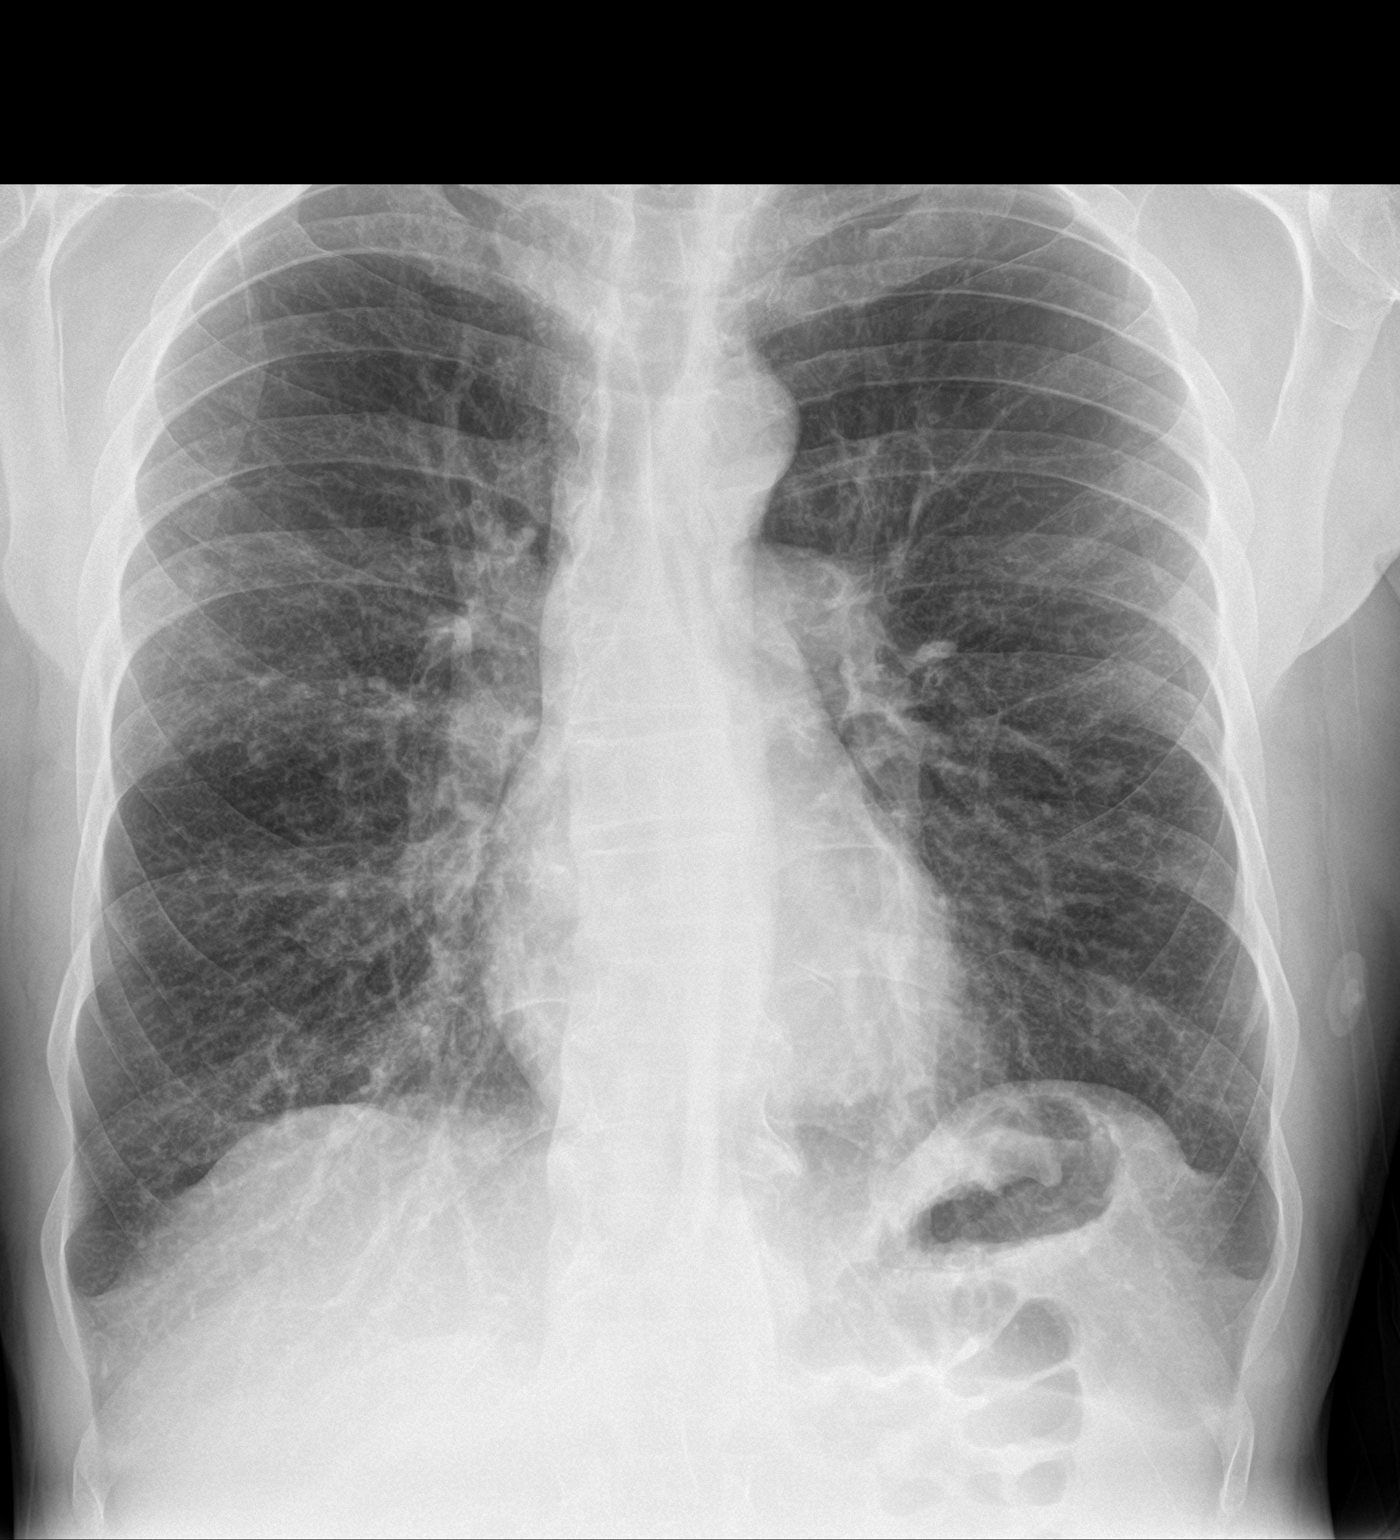

[chest lat]
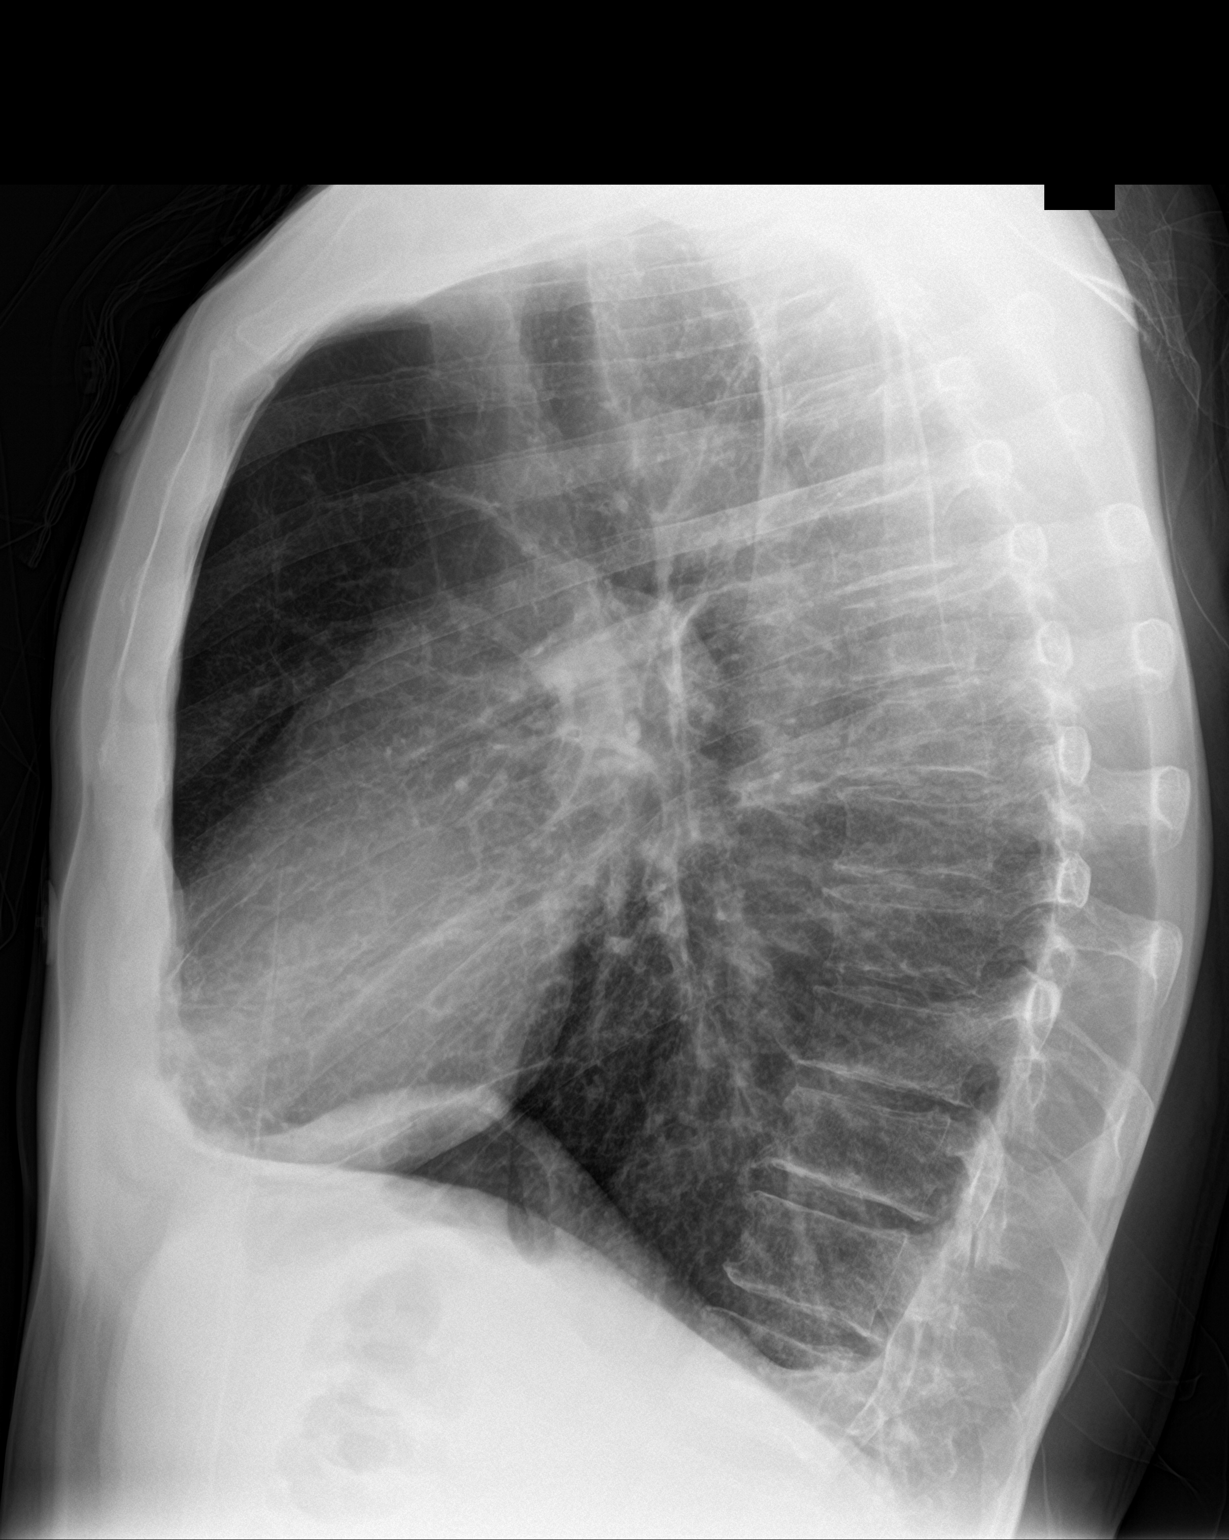

[2 of 2 positions shown; findings below may reference images not displayed]

FINDINGS: Stable cardiomediastinal silhouette within normal limits given
differences in technique and projection. No focal consolidation.
Hyperinflated lungs with flattened diaphragms consistent with
history of COPD. Mild degenerative changes of the thoracic spine.
IMPRESSION: No acute cardiopulmonary process identified. Hyperinflated lungs
consistent with history of COPD.

By: Ted Lop M.D.

## 2016-12-14 ENCOUNTER — Other Ambulatory Visit: Payer: Self-pay | Admitting: Family Medicine

## 2016-12-14 MED ORDER — PREDNISONE 20 MG PO TABS: ORAL_TABLET | ORAL | 0 refills | Status: DC

## 2016-12-14 NOTE — Telephone Encounter (Signed)
Left message return call 12/14/2016  

## 2016-12-14 NOTE — Telephone Encounter (Signed)
Medication sent electronically to pharmacy. Patient advised If ongoing symptoms she needs to be seen if worse need to be seen here or ER. Patient verbalized understanding

## 2016-12-14 NOTE — Telephone Encounter (Signed)
Flare of COPD

## 2016-12-14 NOTE — Telephone Encounter (Signed)
Prednisone 20 mg, #18,3qd for 3d then 2qd for 3d then 1qd for 3d If ongoing symptoms she needs to be seen if worse need to be seen here or ER

## 2016-12-14 NOTE — Telephone Encounter (Signed)
Patient is requesting refill for prednisone to be [facility]

## 2016-12-18 ENCOUNTER — Other Ambulatory Visit: Payer: Self-pay | Admitting: *Deleted

## 2016-12-18 ENCOUNTER — Other Ambulatory Visit: Payer: Self-pay | Admitting: Family Medicine

## 2016-12-18 MED ORDER — ALBUTEROL SULFATE (2.5 MG/3ML) 0.083% IN NEBU: INHALATION_SOLUTION | RESPIRATORY_TRACT | 1 refills | Status: DC

## 2017-01-19 ENCOUNTER — Telehealth: Payer: Self-pay | Admitting: Family Medicine

## 2017-01-19 MED ORDER — PREDNISONE 20 MG PO TABS: ORAL_TABLET | ORAL | 0 refills | Status: DC

## 2017-01-19 MED ORDER — ALPRAZOLAM 1 MG PO TABS: ORAL_TABLET | ORAL | 0 refills | Status: DC

## 2017-01-19 NOTE — Telephone Encounter (Signed)
May have 1 refill needs to keep ov

## 2017-01-19 NOTE — Telephone Encounter (Signed)
Albuterol refill 1, prednisone 20 mg, #12, 3 per day for the next 2 days then 2 a day for the next 2 days then 1 a day for the final 2 days

## 2017-01-19 NOTE — Telephone Encounter (Signed)
Patient has an appt scheduled next week on 01-23-17.  Wants to know if he can get a refill on his Prednisone and his Alprazolam until he can come in.  [facility].

## 2017-01-19 NOTE — Telephone Encounter (Signed)
Patient was wanting a refill of alprazolam-Prednisone sent electronically to pharmacy.

## 2017-01-19 NOTE — Telephone Encounter (Signed)
Prescriptions sent to pharmacy. Patient notified. ?

## 2017-01-23 ENCOUNTER — Encounter: Payer: Self-pay | Admitting: Family Medicine

## 2017-01-23 ENCOUNTER — Ambulatory Visit: Payer: Managed Care, Other (non HMO) | Admitting: Family Medicine

## 2017-01-24 ENCOUNTER — Other Ambulatory Visit: Payer: Self-pay | Admitting: *Deleted

## 2017-01-24 ENCOUNTER — Ambulatory Visit (INDEPENDENT_AMBULATORY_CARE_PROVIDER_SITE_OTHER): Payer: Managed Care, Other (non HMO) | Admitting: Nurse Practitioner

## 2017-01-24 ENCOUNTER — Encounter: Payer: Self-pay | Admitting: Nurse Practitioner

## 2017-01-24 VITALS — BP 142/88 | HR 101 | Ht 69.0 in | Wt 169.0 lb

## 2017-01-24 DIAGNOSIS — J441 Chronic obstructive pulmonary disease with (acute) exacerbation: Secondary | ICD-10-CM | POA: Diagnosis not present

## 2017-01-24 MED ORDER — ALBUTEROL SULFATE (2.5 MG/3ML) 0.083% IN NEBU
2.5000 mg | INHALATION_SOLUTION | Freq: Once | RESPIRATORY_TRACT | Status: AC
  Administered 2017-01-24: 2.5 mg via RESPIRATORY_TRACT

## 2017-01-24 MED ORDER — UMECLIDINIUM BROMIDE 62.5 MCG/INH IN AEPB
1.0000 | INHALATION_SPRAY | Freq: Every day | RESPIRATORY_TRACT | 5 refills | Status: DC
Start: 2017-01-24 — End: 2017-03-22

## 2017-01-24 MED ORDER — TIOTROPIUM BROMIDE MONOHYDRATE 2.5 MCG/ACT IN AERS: INHALATION_SPRAY | RESPIRATORY_TRACT | 11 refills | Status: DC

## 2017-01-24 MED ORDER — BUDESONIDE-FORMOTEROL FUMARATE 160-4.5 MCG/ACT IN AERO: 2.0000 | INHALATION_SPRAY | Freq: Two times a day (BID) | RESPIRATORY_TRACT | 5 refills | Status: DC

## 2017-01-24 NOTE — Patient Instructions (Addendum)
These are your daily medications for your COPD:  Symbicort 2 puffs twice a day Spiriva two puffs once a day  Use your albuterol every 4 hours as needed EITHER inhaler or nebulizer. Call us back next week if no improvement in breathing. Your oxygen level in the office was very good.

## 2017-01-24 NOTE — Progress Notes (Signed)
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 Back home for about 7 days. No fever, sore throat, headache or ear pain. Took last dose of prednisone taper today; prescribed 01/19/17. No significant relief of his symptoms. Using albuterol via inhaler or neb very frequently. Difficult to quantify. Some confusion about Symbicort. Apparently using several times per day. States he was told to use it "whenever he needs it". Continue to smoke cigarettes. Increased cough. Taking mucinex to help clear mucus. Mostly clear. Took Spiriva at one time using the capsule but stopped since he did not like to take the medicine that way.   Objective:   BP (!) 142/88   Pulse (!) 101   Ht  (1.753 m)   Wt 169 lb (76.7 kg)   SpO2 96%   BMI 24.96 kg/m  NAD. Alert, oriented. TMs mild clear effusion. Pharynx mildly injected with clear PND. Neck supple with mild anterior adenopathy. Lungs: initially significantly diminished breath sounds with inspiratory and expiratory wheezes. No tachypnea at rest but mild SOB with activity such as walking or during talking. Pursed lip breathing. Given albuterol 2.5 mg neb treatment. Air flow much improved. Occasional expiratory wheeze but otherwise clear. O2 sat before treatment 96% on room air.  Assessment:   Problem List Items Addressed This Visit      Respiratory   COPD exacerbation (HCC) - Primary   Relevant Medications   albuterol (PROVENTIL) (2.5 MG/3ML) 0.083% nebulizer solution 2.5 mg (Completed)   umeclidinium bromide (INCRUSE ELLIPTA) 62.5 MCG/INH AEPB       Plan:   Meds ordered this encounter  Medications  . DISCONTD: Tiotropium Bromide Monohydrate (SPIRIVA RESPIMAT) 2.5 MCG/ACT AERS    Sig: Take 2 puffs qd; take daily for COPD    Dispense:  1 Inhaler    Refill:  11    Please dispense 30 or 90 day supply per patient preference and cost    Order Specific Question:   Supervising Provider   Answer:   Merlyn Albert [[id number]]  . albuterol (PROVENTIL) (2.5 MG/3ML) 0.083% nebulizer solution 2.5 mg  . umeclidinium bromide (INCRUSE ELLIPTA) 62.5 MCG/INH AEPB    Sig: Inhale 1 puff into the lungs daily.    Dispense:  30 each    Refill:  5    Please notify patient about change in medication; instructions were given for Spiriva. Thanks.    Order Specific Question:   Supervising Provider    Answer:   Riccardo Dubin   Initially given Rx for Spiriva but pharmacy contacted office since this is not preferred. Switched to [employer]. Patient was given written instructions about meds before leaving office but asked pharmacist to update with new med to make sure he understands. Patient to take Symbicort and Incruse daily. Call back next week if no improvement in breathing. Seek help immediately if worse. Otherwise reschedule routine follow up.

## 2017-02-09 ENCOUNTER — Other Ambulatory Visit: Payer: Self-pay | Admitting: Nurse Practitioner

## 2017-02-09 ENCOUNTER — Telehealth: Payer: Self-pay | Admitting: Family Medicine

## 2017-02-09 MED ORDER — PREDNISONE 20 MG PO TABS: ORAL_TABLET | ORAL | 0 refills | Status: DC

## 2017-02-09 NOTE — Telephone Encounter (Signed)
Pt called back with the address to the pharmacy he wants prednisone sent to:  [facility] [address]  [place], [place]

## 2017-02-09 NOTE — Telephone Encounter (Signed)
Pt is requesting prednisone to be [facility] [place]. Pt was told Dr. Lorin PicketScott is not in and he requested for the message to be sent to [facility].

## 2017-02-09 NOTE — Telephone Encounter (Signed)
done

## 2017-03-14 ENCOUNTER — Other Ambulatory Visit: Payer: Self-pay | Admitting: Family Medicine

## 2017-03-22 ENCOUNTER — Other Ambulatory Visit: Payer: Self-pay | Admitting: Nurse Practitioner

## 2017-04-26 ENCOUNTER — Telehealth: Payer: Self-pay | Admitting: Family Medicine

## 2017-04-26 MED ORDER — ALPRAZOLAM 1 MG PO TABS: ORAL_TABLET | ORAL | 0 refills | Status: DC

## 2017-04-26 MED ORDER — PREDNISONE 20 MG PO TABS: ORAL_TABLET | ORAL | 0 refills | Status: DC

## 2017-04-26 NOTE — Telephone Encounter (Signed)
Patient calling to get a refill on ALPRAZolam (XANAX) 1 MG, and predniSONE (DELTASONE) 20 MG  [facility] [place], [place] - [address] Pt # [phone]

## 2017-04-26 NOTE — Telephone Encounter (Signed)
Prescriptions sent to pharmacy. Patient notified. ?

## 2017-04-26 NOTE — Telephone Encounter (Signed)
May have one refill on Xanax, prednisone may be 20 mg, #12, 3 a day for 2 days to a day for 2 days 1 a day for 2 days, needs office visit before further refills

## 2017-05-04 ENCOUNTER — Ambulatory Visit (INDEPENDENT_AMBULATORY_CARE_PROVIDER_SITE_OTHER): Payer: Managed Care, Other (non HMO) | Admitting: Nurse Practitioner

## 2017-05-04 ENCOUNTER — Encounter: Payer: Self-pay | Admitting: Nurse Practitioner

## 2017-05-04 VITALS — BP 122/74 | Temp 98.1°F | Wt 167.4 lb

## 2017-05-04 DIAGNOSIS — R600 Localized edema: Secondary | ICD-10-CM

## 2017-05-04 DIAGNOSIS — J441 Chronic obstructive pulmonary disease with (acute) exacerbation: Secondary | ICD-10-CM | POA: Diagnosis not present

## 2017-05-04 DIAGNOSIS — R609 Edema, unspecified: Secondary | ICD-10-CM

## 2017-05-04 MED ORDER — ALBUTEROL SULFATE (2.5 MG/3ML) 0.083% IN NEBU
2.5000 mg | INHALATION_SOLUTION | Freq: Once | RESPIRATORY_TRACT | Status: AC
  Administered 2017-05-04: 2.5 mg via RESPIRATORY_TRACT

## 2017-05-04 MED ORDER — FUROSEMIDE 20 MG PO TABS: 20.0000 mg | ORAL_TABLET | Freq: Every day | ORAL | 2 refills | Status: DC

## 2017-05-04 MED ORDER — POTASSIUM CHLORIDE CRYS ER 10 MEQ PO TBCR: EXTENDED_RELEASE_TABLET | ORAL | 2 refills | Status: DC

## 2017-05-04 MED ORDER — DOXYCYCLINE HYCLATE 100 MG PO TABS: 100.0000 mg | ORAL_TABLET | Freq: Two times a day (BID) | ORAL | 0 refills | Status: DC

## 2017-05-04 NOTE — Patient Instructions (Signed)
Use incruse ellipta and symbicort daily for COPD and wheezing.  Should be able to decrease use of albuterol over time.  Take Furosemide on days when legs are swelling; also take potassium those days. Remember it will make you urinate.

## 2017-05-04 NOTE — Progress Notes (Signed)
Subjective:  Presents for complaints of body aches cough and congestion over the past couple of weeks. Has COPD. Now having prolonged coughing spells occasionally producing green sputum. Is currently finishing a course of prednisone, has 2 doses left. Has been on ellipta inhaler, just got his Symbicort and started yesterday. Low-grade fever. Itchy scratchy throat. Facial sinus pressure. Frequent wheezing. 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 Has been off work the past week with no edema noted. HCTZ does not seem to be helping. Has cut back his smoking from 2 packs to a pack and a half. Plans to reduce to one pack per day.  Objective:   BP 122/74   Temp 98.1 F (36.7 C) (Oral)   Wt 167 lb 6 oz (75.9 kg)   SpO2 93%   BMI 24.72 kg/m  NAD. Alert, oriented. TMs clear effusion, no erythema. Pharynx injected with PND noted. Neck supple with mild soft anterior adenopathy. Occasional harsh congested cough noted. Lungs initially diminished breath sounds in general with expiratory wheezes noted throughout lung fields with some crackles. Tachypnea only noted with coughing or talking. Given albuterol nebulizer treatment. No further wheezing with much improved airflow and less tachypnea noted. Heart regular rate rhythm. Lower extremities no edema. Abdomen soft nondistended nontender.  Assessment:  COPD exacerbation (HCC) - Plan: albuterol (PROVENTIL) (2.5 MG/3ML) 0.083% nebulizer solution 2.5 mg  Peripheral edema - Plan: albuterol (PROVENTIL) (2.5 MG/3ML) 0.083% nebulizer solution 2.5 mg    Plan:   Meds ordered this encounter  Medications  . furosemide (LASIX) 20 MG tablet    Sig: Take 1 tablet (20 mg total) by mouth daily. Prn leg swelling; take potassium pill at same time    Dispense:  30 tablet    Refill:  2    Order Specific Question:   Supervising Provider    Answer:   Merlyn AlbertLUKING, WILLIAM S [[id number]]  . potassium  chloride (K-DUR,KLOR-CON) 10 MEQ tablet    Sig: Take one po qd with Furosemide    Dispense:  30 tablet    Refill:  2    Order Specific Question:   Supervising Provider    Answer:   Merlyn AlbertLUKING, WILLIAM S [[id number]]  . doxycycline (VIBRA-TABS) 100 MG tablet    Sig: Take 1 tablet (100 mg total) by mouth 2 (two) times daily.    Dispense:  20 tablet    Refill:  0    Order Specific Question:   Supervising Provider    Answer:   Merlyn AlbertLUKING, WILLIAM S [[id number]]  . albuterol (PROVENTIL) (2.5 MG/3ML) 0.083% nebulizer solution 2.5 mg   Stop HCTZ. Switch to Lasix with potassium daily as needed for swelling when he is driving. Warning signs reviewed. Call back next week if no improvement in symptoms, sooner if worse. Also recommend recheck at his next visit home.

## 2017-06-11 ENCOUNTER — Telehealth: Payer: Self-pay | Admitting: Family Medicine

## 2017-06-11 NOTE — Telephone Encounter (Signed)
Patient has been having trouble breathing for over a week.  He is [facility] [place], [place]

## 2017-06-11 NOTE — Telephone Encounter (Signed)
Nurses please touch with patient-if any fevers chills discolored phlegm he needs to be seen. Otherwise may do prednisone 20 mg, #12, 3 per day for 2 days, then 2 per day for 2 days then 1 a day for 2 days-follow-up if ongoing troubles-go to ER if acutely worse

## 2017-06-11 NOTE — Telephone Encounter (Signed)
Spoke with patient and patient stated that he is having some fevers and chills with yellow phlegm. Advised patient that Dr.Scott recommends that he be seen. Patient verbalized understanding and stated that he would like to be seen here. Advised patient that if he is worse to go to the ER. Patient verbalized understanding.

## 2017-06-12 ENCOUNTER — Encounter: Payer: Self-pay | Admitting: Family Medicine

## 2017-06-12 ENCOUNTER — Ambulatory Visit (INDEPENDENT_AMBULATORY_CARE_PROVIDER_SITE_OTHER): Payer: Managed Care, Other (non HMO) | Admitting: Family Medicine

## 2017-06-12 VITALS — BP 136/84 | Temp 97.9°F | Ht 69.0 in | Wt 167.0 lb

## 2017-06-12 DIAGNOSIS — Z13220 Encounter for screening for lipoid disorders: Secondary | ICD-10-CM | POA: Diagnosis not present

## 2017-06-12 DIAGNOSIS — Z125 Encounter for screening for malignant neoplasm of prostate: Secondary | ICD-10-CM | POA: Diagnosis not present

## 2017-06-12 DIAGNOSIS — J441 Chronic obstructive pulmonary disease with (acute) exacerbation: Secondary | ICD-10-CM

## 2017-06-12 MED ORDER — MELOXICAM 7.5 MG PO TABS: 7.5000 mg | ORAL_TABLET | Freq: Every day | ORAL | 0 refills | Status: DC

## 2017-06-12 MED ORDER — DOXYCYCLINE HYCLATE 100 MG PO TABS: 100.0000 mg | ORAL_TABLET | Freq: Two times a day (BID) | ORAL | 0 refills | Status: DC

## 2017-06-12 MED ORDER — PREDNISONE 20 MG PO TABS: ORAL_TABLET | ORAL | 0 refills | Status: DC

## 2017-06-12 NOTE — Progress Notes (Signed)
   Subjective:    Patient ID: Jeffrey Krueger, male    DOB: 1956-05-25, 61 y.o.   MRN: [id number]  Shortness of Breath  This is a recurrent problem. Pertinent negatives include no chest pain, fever, headaches or leg swelling. Treatments tried: neb treatments.   Low back pain. Started one week ago. Getting better. Taking excedrin for the pain. Patient relates intermittent low back pain does not radiate to the legs  Has severe COPD patient has been counseled to quit smoking he is trying to cut back he relates some coughing some shortness of breath denies vomiting diarrhea high fever chills or sweats  Review of Systems  Constitutional: Negative for activity change, fatigue and fever.  Respiratory: Positive for shortness of breath. Negative for cough.   Cardiovascular: Negative for chest pain and leg swelling.  Neurological: Negative for headaches.       Objective:   Physical Exam  Constitutional: He appears well-nourished. No distress.  Cardiovascular: Normal rate, regular rhythm and normal heart sounds.   No murmur heard. Pulmonary/Chest: Effort normal and breath sounds normal. No respiratory distress.  Musculoskeletal: He exhibits no edema.  Lymphadenopathy:    He has no cervical adenopathy.  Neurological: He is alert.  Psychiatric: His behavior is normal.  Vitals reviewed.         Assessment & Plan:  COPD exacerbation Patient counseled to quit smoking Will be due for his annual CTs and in December Patient to doxycycline over the course of next several days plus also prednisone taper Albuterol when necessary Follow-up ongoing trouble Handicap placard paperwork filled out Patient defers on colonoscopy and prostate exam Screening lab work ordered

## 2017-06-17 ENCOUNTER — Other Ambulatory Visit: Payer: Self-pay | Admitting: Family Medicine

## 2017-06-21 ENCOUNTER — Telehealth: Payer: Self-pay

## 2017-06-21 NOTE — Telephone Encounter (Signed)
Patient wife called today says pt experiencing black stool x's three days.Spoke with Dr.Scott Luking if dizzy,short of breath or having tarry stool needs to be evaluated by ED to make sure no bleeding ulcer. Spoke with the pt wife and she states he  Is having shortness of breath.Wife is aware to take to Ed to have evaluated.

## 2017-06-24 ENCOUNTER — Other Ambulatory Visit: Payer: Self-pay | Admitting: Nurse Practitioner

## 2017-06-30 ENCOUNTER — Other Ambulatory Visit: Payer: Self-pay | Admitting: Family Medicine

## 2017-07-03 ENCOUNTER — Other Ambulatory Visit: Payer: Self-pay

## 2017-07-03 MED ORDER — FUROSEMIDE 20 MG PO TABS: 20.0000 mg | ORAL_TABLET | Freq: Every day | ORAL | 0 refills | Status: DC

## 2017-07-05 ENCOUNTER — Other Ambulatory Visit: Payer: Self-pay | Admitting: Family Medicine

## 2017-07-05 ENCOUNTER — Telehealth: Payer: Self-pay | Admitting: Family Medicine

## 2017-07-05 MED ORDER — ALBUTEROL SULFATE HFA 108 (90 BASE) MCG/ACT IN AERS: 1.0000 | INHALATION_SPRAY | Freq: Four times a day (QID) | RESPIRATORY_TRACT | 1 refills | Status: DC | PRN

## 2017-07-05 MED ORDER — BUDESONIDE-FORMOTEROL FUMARATE 160-4.5 MCG/ACT IN AERO: 2.0000 | INHALATION_SPRAY | Freq: Two times a day (BID) | RESPIRATORY_TRACT | 5 refills | Status: DC

## 2017-07-05 NOTE — Telephone Encounter (Signed)
Pt is needing refills on budesonide-formoterol (SYMBICORT) 160-4.5 MCG/ACT inhaler albuterol (PROVENTIL HFA;VENTOLIN HFA) 108 (90 Base) MCG/ACT inhaler  [facility] [address]  [place]

## 2017-07-05 NOTE — Telephone Encounter (Signed)
Patient is aware rx sent to request pharmacy.

## 2017-07-17 ENCOUNTER — Telehealth: Payer: Self-pay | Admitting: Family Medicine

## 2017-07-17 MED ORDER — ALPRAZOLAM 1 MG PO TABS: ORAL_TABLET | ORAL | 0 refills | Status: DC

## 2017-07-17 NOTE — Telephone Encounter (Signed)
May have one refill-caution drowsiness

## 2017-07-17 NOTE — Telephone Encounter (Signed)
Patient requesting refill on xanax 1 mg.

## 2017-07-17 NOTE — Addendum Note (Signed)
Addended by: Margaretha SheffieldBROWN, Danisa Kopec S on: 07/17/2017 02:08 PM   Modules accepted: Orders

## 2017-07-17 NOTE — Telephone Encounter (Signed)
Prescription faxed to pharmacy. Patient notified. 

## 2017-07-31 ENCOUNTER — Other Ambulatory Visit: Payer: Self-pay | Admitting: Nurse Practitioner

## 2017-08-27 ENCOUNTER — Telehealth: Payer: Self-pay | Admitting: Family Medicine

## 2017-08-27 MED ORDER — ALBUTEROL SULFATE (2.5 MG/3ML) 0.083% IN NEBU: INHALATION_SOLUTION | RESPIRATORY_TRACT | 1 refills | Status: DC

## 2017-08-27 NOTE — Telephone Encounter (Signed)
Requesting [facility]izer solution [facility] [place]

## 2017-08-27 NOTE — Telephone Encounter (Signed)
I called and left a vm that we sent in the medication to the pharmacy he requested.

## 2017-09-13 ENCOUNTER — Telehealth: Payer: Self-pay | Admitting: Family Medicine

## 2017-09-13 NOTE — Telephone Encounter (Signed)
FYI-Wife terri wanted you to know patient was hospitalize on 12/17 with breathing issues and was put on vent but off now.[place] not sure when he will be leased yet. But wants a follow up when he gets home and she wants him back on oxygen to help with his breathing

## 2017-09-13 NOTE — Telephone Encounter (Signed)
Once he gets released from the hospital they should call to schedule a follow-up office visit within 7-10 days of being released from the hospital

## 2017-09-14 ENCOUNTER — Telehealth: Payer: Self-pay | Admitting: Family Medicine

## 2017-09-14 NOTE — Telephone Encounter (Signed)
Pt is needing home oxygen set up as soon as possible. Pt is currently in the hospital in [place]. Pt will be home in 3 days. Hospital is telling them that he needs to have it waiting on him when he gets home. Pt will also need a referral to a pulmonologist in [place]. Please advise.

## 2017-09-14 NOTE — Telephone Encounter (Signed)
See dr scotts resonse from yest we need to have all the documentationa and an assessment befor e getting this rolling  Ppt with dr Lorin Picketscott

## 2017-09-14 NOTE — Telephone Encounter (Signed)
ok 

## 2017-09-14 NOTE — Telephone Encounter (Signed)
Wife to schedule office visit for follow up when patient released from hospital

## 2017-09-14 NOTE — Telephone Encounter (Signed)
Case manager at hospital to arrange oxygen for home at discharge. Patient scheduled follow up office visit for 09/20/17 with Dr Lorin PicketScott

## 2017-09-16 ENCOUNTER — Other Ambulatory Visit: Payer: Self-pay | Admitting: Family Medicine

## 2017-09-20 ENCOUNTER — Encounter: Payer: Self-pay | Admitting: Family Medicine

## 2017-09-20 ENCOUNTER — Ambulatory Visit (INDEPENDENT_AMBULATORY_CARE_PROVIDER_SITE_OTHER): Payer: Managed Care, Other (non HMO) | Admitting: Family Medicine

## 2017-09-20 VITALS — BP 118/72 | Ht 69.0 in | Wt 153.6 lb

## 2017-09-20 DIAGNOSIS — J449 Chronic obstructive pulmonary disease, unspecified: Secondary | ICD-10-CM

## 2017-09-20 DIAGNOSIS — Z1211 Encounter for screening for malignant neoplasm of colon: Secondary | ICD-10-CM

## 2017-09-20 DIAGNOSIS — J441 Chronic obstructive pulmonary disease with (acute) exacerbation: Secondary | ICD-10-CM | POA: Insufficient documentation

## 2017-09-20 MED ORDER — UMECLIDINIUM BROMIDE 62.5 MCG/INH IN AEPB: 1.0000 | INHALATION_SPRAY | Freq: Every day | RESPIRATORY_TRACT | 3 refills | Status: DC

## 2017-09-20 MED ORDER — ALBUTEROL SULFATE HFA 108 (90 BASE) MCG/ACT IN AERS: 1.0000 | INHALATION_SPRAY | Freq: Four times a day (QID) | RESPIRATORY_TRACT | 1 refills | Status: DC | PRN

## 2017-09-20 MED ORDER — BUDESONIDE-FORMOTEROL FUMARATE 160-4.5 MCG/ACT IN AERO: 2.0000 | INHALATION_SPRAY | Freq: Two times a day (BID) | RESPIRATORY_TRACT | 5 refills | Status: DC

## 2017-09-20 MED ORDER — ALPRAZOLAM 1 MG PO TABS: ORAL_TABLET | ORAL | 3 refills | Status: DC

## 2017-09-20 NOTE — Progress Notes (Signed)
   Subjective:    Patient ID: Jeffrey Krueger, male    DOB: 02-03-1956, 61 y.o.   MRN: [id number]  HPI Patient arrives after a follow up from recent hospitalization. Patient states he has stopped smoking and hasn't smoked in 12 days. Patient was in the hospital with severe COPD was on a ventilator for multiple days now he states he is doing better not having to use oxygen supplementation states his energy level doing better staying away from smoking has severe PMH COPD  Review of Systems  Constitutional: Negative for activity change, fatigue and fever.  Respiratory: Negative for cough and shortness of breath.   Cardiovascular: Negative for chest pain and leg swelling.  Neurological: Negative for headaches.       Objective:   Physical Exam  Constitutional: He appears well-nourished. No distress.  Cardiovascular: Normal rate, regular rhythm and normal heart sounds.  No murmur heard. Pulmonary/Chest: Effort normal and breath sounds normal. No respiratory distress.  Musculoskeletal: He exhibits no edema.  Lymphadenopathy:    He has no cervical adenopathy.  Neurological: He is alert.  Psychiatric: His behavior is normal.  Vitals reviewed.         Assessment & Plan:  COPD patient now agrees to get colonoscopy referral for colonoscopy  Severe COPD refills on his medicine given patient encouraged to stay away from smoking  Patient does not need oxygen currently he may return to work on January 10  Referral to pulmonary force of his severe COPD

## 2017-09-20 NOTE — Patient Instructions (Signed)
As part of today's visit a referral has been made. This is a process that is handled by our clinical referral specialists. This process requires that we send your medical information to the specialists for their review before they will issue you an appointment. Unfortunately this does take time and much of this process is under the responsibility of the specialists. For emergent referrals we do our best to speed up this process.Our referral specialist will make certain that your insurance company is notified as well as the physician group that we are referring you to for your problem. Emergent referrals are made as quick as possible. Most standard referrals often take 10 days before we hear from the specialists office when they can see you. If you have not heard when your appointment is from us or the referral specialists within 10 days please call us regarding this referral.  

## 2017-09-21 ENCOUNTER — Telehealth: Payer: Self-pay | Admitting: Family Medicine

## 2017-09-21 MED ORDER — BUDESONIDE-FORMOTEROL FUMARATE 160-4.5 MCG/ACT IN AERO: 2.0000 | INHALATION_SPRAY | Freq: Two times a day (BID) | RESPIRATORY_TRACT | 1 refills | Status: DC

## 2017-09-21 NOTE — Telephone Encounter (Signed)
Patient was seen yesterday. Wife said that [employer] will not pay for his Symbicort unless it is for a 90 day supply.  [facility] [address] [facility] [place] was suppose to send us something yesterday stating this.

## 2017-09-21 NOTE — Telephone Encounter (Signed)
Prescription sent electronically to pharmacy. Patient notified. 

## 2017-09-27 ENCOUNTER — Encounter: Payer: Self-pay | Admitting: Family Medicine

## 2017-09-30 ENCOUNTER — Other Ambulatory Visit: Payer: Self-pay | Admitting: Family Medicine

## 2017-10-02 ENCOUNTER — Encounter: Payer: Self-pay | Admitting: Family Medicine

## 2017-10-29 ENCOUNTER — Telehealth: Payer: Self-pay | Admitting: Family Medicine

## 2017-10-29 NOTE — Telephone Encounter (Signed)
Last seen 09/20/2017 to follow up on Colonoscopy.

## 2017-10-29 NOTE — Telephone Encounter (Signed)
error 

## 2017-10-29 NOTE — Telephone Encounter (Signed)
Pt is requesting a refill on  ALPRAZolam (XANAX) 1 MG tablet  [facility] [address] [place]

## 2017-11-05 ENCOUNTER — Telehealth: Payer: Self-pay | Admitting: Family Medicine

## 2017-11-05 MED ORDER — ALPRAZOLAM 1 MG PO TABS: ORAL_TABLET | ORAL | 2 refills | Status: DC

## 2017-11-05 NOTE — Telephone Encounter (Signed)
Patient didn't get prescription for Xanax when he was in [place] and now that he is back home he needs a new script [facility] [place].  I told him he should still have refills left there but he said when they transferred the rx to [place] it canceled out all the refills and now they need a new script sent.

## 2017-11-05 NOTE — Telephone Encounter (Signed)
Please send the new prescription same exact directions 30 tablets with 2 refills

## 2017-11-05 NOTE — Telephone Encounter (Signed)
Prescription faxed to pharmacy. Patient notified. 

## 2017-11-22 ENCOUNTER — Telehealth: Payer: Self-pay | Admitting: Family Medicine

## 2017-11-22 MED ORDER — ALPRAZOLAM 1 MG PO TABS: ORAL_TABLET | ORAL | 0 refills | Status: DC

## 2017-11-22 NOTE — Telephone Encounter (Signed)
I cannot do 90 days.  We may do Xanax 1 mg 1/2-1 whole no more than 3 times daily #60

## 2017-11-22 NOTE — Telephone Encounter (Signed)
Written awaiting signature. 

## 2017-11-22 NOTE — Telephone Encounter (Signed)
Pt is requesting a refill on ALPRAZolam (XANAX) 1 MG tablet  PT IS NEEDING 90 DAY REFILLS IF POSSIBLE. PT WILL NEED THIS BEFORE THE 8TH WHEN HE GOES BACK ON THE RD.    [facility] [address] [place]

## 2017-11-23 NOTE — Telephone Encounter (Signed)
Left message to see if pt received rx. It was printed yesterday and I assume it was faxed. Wanted to make sure pt got med

## 2017-11-23 NOTE — Telephone Encounter (Signed)
Pt called back and states he did get his rx

## 2017-12-17 ENCOUNTER — Telehealth: Payer: Self-pay

## 2017-12-17 ENCOUNTER — Telehealth: Payer: Self-pay | Admitting: Family Medicine

## 2017-12-17 NOTE — Telephone Encounter (Signed)
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 She states that this has been ongoing for three months he has a green and black discharge and has a smell to it he has had to throw away his underwear. He is not running a fever,but does have pain.He has some redness on penis and testicles. She states [place] in [place] and will not be back home until April 18,2019. I advised that he be seen by ed or urgent care and to follow up with us when back home.

## 2017-12-17 NOTE — Telephone Encounter (Signed)
definitely

## 2017-12-17 NOTE — Telephone Encounter (Signed)
error 

## 2017-12-31 ENCOUNTER — Telehealth: Payer: Self-pay | Admitting: Family Medicine

## 2017-12-31 NOTE — Telephone Encounter (Signed)
Jeffrey ChildesFYI, wife called saying Jeffrey Krueger would not go to ER or Urgent care per last phone message and will only come here.  Wants to come next week on Monday when wife already has an appt with Jeffrey Krueger.  I scheduled the appt but thought I would put a message back since nature of appt seems to be more urgent than a week out. "Genital problems"

## 2017-12-31 NOTE — Telephone Encounter (Signed)
This will be fine the patient can follow-up sooner if any problems

## 2018-01-07 ENCOUNTER — Encounter: Payer: Self-pay | Admitting: Family Medicine

## 2018-01-07 ENCOUNTER — Ambulatory Visit (INDEPENDENT_AMBULATORY_CARE_PROVIDER_SITE_OTHER): Payer: Managed Care, Other (non HMO) | Admitting: Family Medicine

## 2018-01-07 VITALS — BP 118/74 | Temp 97.9°F | Ht 69.0 in | Wt 161.4 lb

## 2018-01-07 DIAGNOSIS — J449 Chronic obstructive pulmonary disease, unspecified: Secondary | ICD-10-CM | POA: Diagnosis not present

## 2018-01-07 DIAGNOSIS — Z125 Encounter for screening for malignant neoplasm of prostate: Secondary | ICD-10-CM

## 2018-01-07 DIAGNOSIS — E7849 Other hyperlipidemia: Secondary | ICD-10-CM

## 2018-01-07 DIAGNOSIS — Z720 Tobacco use: Secondary | ICD-10-CM | POA: Diagnosis not present

## 2018-01-07 MED ORDER — BUPROPION HCL ER (SR) 150 MG PO TB12: 150.0000 mg | ORAL_TABLET | Freq: Two times a day (BID) | ORAL | 2 refills | Status: DC

## 2018-01-07 MED ORDER — UMECLIDINIUM BROMIDE 62.5 MCG/INH IN AEPB: 1.0000 | INHALATION_SPRAY | Freq: Every day | RESPIRATORY_TRACT | 3 refills | Status: DC

## 2018-01-07 MED ORDER — ALBUTEROL SULFATE HFA 108 (90 BASE) MCG/ACT IN AERS: 1.0000 | INHALATION_SPRAY | Freq: Four times a day (QID) | RESPIRATORY_TRACT | 1 refills | Status: DC | PRN

## 2018-01-07 MED ORDER — KETOCONAZOLE 2 % EX CREA: 1.0000 "application " | TOPICAL_CREAM | Freq: Two times a day (BID) | CUTANEOUS | 4 refills | Status: AC

## 2018-01-07 MED ORDER — PREDNISONE 20 MG PO TABS: ORAL_TABLET | ORAL | 0 refills | Status: DC

## 2018-01-07 MED ORDER — ALPRAZOLAM 1 MG PO TABS: ORAL_TABLET | ORAL | 2 refills | Status: DC

## 2018-01-07 NOTE — Addendum Note (Signed)
Addended by: Lilyan PuntLUKING, Jarett Dralle A on: 01/07/2018 10:41 PM   Modules accepted: Orders

## 2018-01-07 NOTE — Progress Notes (Addendum)
Subjective:    Patient ID: Jeffrey Krueger, male    DOB: [birth year], 62 y.o.   MRN: [id number]  HPI Pt here for genital gangrene.  Patient states he had a severe rash in the groin region cause itching burning discomfort.  States he thought he had gangrene but it got better and went away.  He is not quite sure what he had.  Phone message on 12/31/2017. Need refill on Alprazolam and incruse inhaler. Patient has severe COPD uses albuterol as needed uses his other inhalers when he has them sometimes has difficult time getting them filled because of cost does need refills  Patient does use tobacco products.  Patient knows they should quit.  Patient is aware that smoking/in use of tobacco products increases their risk of heart disease, cancer, and COPD- lung issues.  Patient has been counseled to quit smoking/tobacco products.  Patient suffers with insomnia.  This is been going on for a while.  The patient finds it necessary to use medication to help sleep.  Patient finds it if not using medication has significant troubles.  Denies abusing the medication.  Denies any negative side effects. 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   Review of Systems  Constitutional: Negative for activity change, fatigue and fever.  HENT: Negative for congestion and rhinorrhea.   Respiratory: Positive for shortness of breath and wheezing. Negative for cough.   Cardiovascular: Negative for chest pain and leg swelling.  Gastrointestinal: Negative for abdominal pain, diarrhea and nausea.  Genitourinary: Negative for dysuria and hematuria.  Neurological: Negative for weakness and headaches.  Psychiatric/Behavioral: Negative for behavioral problems.       Objective:   Physical Exam  Constitutional: He appears well-developed.    HENT:  Head: Normocephalic and atraumatic.  Mouth/Throat: Oropharynx is clear and moist. No oropharyngeal exudate.  Eyes: Right eye exhibits no discharge. Left eye exhibits no discharge.  Neck: Normal range of motion.  Cardiovascular: Normal rate, regular rhythm and normal heart sounds.  No murmur heard. Pulmonary/Chest: Effort normal and breath sounds normal. No respiratory distress. He has no wheezes. He has no rales.  Lymphadenopathy:    He has no cervical adenopathy.  Neurological: He exhibits normal muscle tone.  Skin: Skin is warm and dry.  Nursing note and vitals reviewed.   Patient also has tinea in the groin region.      Assessment & Plan:  COPD Patient needs to quit smoking Counseled to do so He would like to try Wellbutrin twice daily to see if it will help him quit smoking He tends to be stressed anyways and he is hopeful this will help him If it does cause any setbacks he will stop the medicine  Hyperlipidemia check lab work watch diet stay active  Labs screening ordered  Insomnia Xanax for home use no driving of any vehicle within 8 hours of taking the medicine  Recently they have moved to [place].  I discussed with them how they will need to be working on getting a more local doctor.  We will see them back in 6 months.  If he does not have a primary care doctor at that point we will do one more round of refills but they will need to get established locally I discussed ways that they could do  that  Tinea-we will send an antifungal cream that he can use twice a day over the next few weeks this should help

## 2018-02-07 ENCOUNTER — Telehealth: Payer: Self-pay | Admitting: Family Medicine

## 2018-02-07 ENCOUNTER — Other Ambulatory Visit: Payer: Self-pay | Admitting: *Deleted

## 2018-02-07 MED ORDER — ALBUTEROL SULFATE HFA 108 (90 BASE) MCG/ACT IN AERS: 1.0000 | INHALATION_SPRAY | Freq: Four times a day (QID) | RESPIRATORY_TRACT | 5 refills | Status: DC | PRN

## 2018-02-07 NOTE — Telephone Encounter (Signed)
Requesting refill on Rescue Inhaler.   [facility] [place] [place]

## 2018-02-07 NOTE — Telephone Encounter (Signed)
Med sent to pharm. Message was already left for pt to return call

## 2018-02-07 NOTE — Telephone Encounter (Signed)
[address]

## 2018-02-07 NOTE — Telephone Encounter (Signed)
May have 6 refills 

## 2018-02-07 NOTE — Telephone Encounter (Signed)
Last seen 01/07/18

## 2018-02-07 NOTE — Telephone Encounter (Signed)
Pt.notified

## 2018-02-07 NOTE — Telephone Encounter (Signed)
I called left a message for the wife asked that she r/c with the name of the road [facility] as there are many in [place].

## 2018-03-11 ENCOUNTER — Telehealth: Payer: Self-pay | Admitting: Family Medicine

## 2018-03-11 MED ORDER — ALBUTEROL SULFATE HFA 108 (90 BASE) MCG/ACT IN AERS: INHALATION_SPRAY | RESPIRATORY_TRACT | 3 refills | Status: DC

## 2018-03-11 MED ORDER — ALPRAZOLAM 1 MG PO TABS: ORAL_TABLET | ORAL | 0 refills | Status: DC

## 2018-03-11 NOTE — Telephone Encounter (Signed)
The albuterol can be changed to 3 inhalers, 2 puffs every 4 hours of the albuterol as needed  He may have Xanax 1 mg, #60, half or a whole tablet twice daily as directed

## 2018-03-11 NOTE — Telephone Encounter (Signed)
Rx written awaiting signature. 

## 2018-03-11 NOTE — Telephone Encounter (Signed)
Please advise 

## 2018-03-11 NOTE — Telephone Encounter (Signed)
Patient is requesting Rx for Pro air inhaler changed to a 90 day supply.  Also, he would like a refill on ALPRAZolam (XANAX) 1 MG tablet.  He only has 2 left.  [facility] [place] [place]

## 2018-03-11 NOTE — Telephone Encounter (Signed)
Patient is aware we have sent in the medications at his request.

## 2018-03-15 ENCOUNTER — Telehealth: Payer: Self-pay | Admitting: Family Medicine

## 2018-03-15 NOTE — Telephone Encounter (Signed)
Hope he does better-certainly if he needs to follow-up after hospitalization we can do this- when he needs to establish himself with a family physician in [place] as well

## 2018-03-15 NOTE — Telephone Encounter (Signed)
[name] is calling to let Dr. Lorin PicketScott know that Jeffrey Krueger is currently in the hospital in [place].  He was sick and vomiting.  They are checking for liver disease.

## 2018-03-15 NOTE — Telephone Encounter (Signed)
Patient is aware 

## 2018-03-15 NOTE — Telephone Encounter (Signed)
I called and left a message for Jeffrey Krueger to r/c.

## 2018-04-05 ENCOUNTER — Other Ambulatory Visit: Payer: Self-pay | Admitting: Family Medicine

## 2018-05-25 ENCOUNTER — Other Ambulatory Visit: Payer: Self-pay | Admitting: Family Medicine

## 2018-07-07 ENCOUNTER — Other Ambulatory Visit: Payer: Self-pay | Admitting: Family Medicine

## 2018-07-08 NOTE — Telephone Encounter (Signed)
This patient lives in [place].  He should be established with a primary care doctor up there

## 2018-07-08 NOTE — Telephone Encounter (Signed)
Patient states he has a pcp in [place]. He will call them and have them to send in the medication for him.

## 2018-07-23 ENCOUNTER — Other Ambulatory Visit: Payer: Self-pay | Admitting: Family Medicine

## 2019-04-08 ENCOUNTER — Other Ambulatory Visit: Payer: Self-pay | Admitting: Family Medicine

## 2019-04-08 NOTE — Telephone Encounter (Signed)
Please deny med 

## 2020-10-05 ENCOUNTER — Ambulatory Visit: Payer: Managed Care, Other (non HMO) | Admitting: Family Medicine

## 2020-10-05 ENCOUNTER — Other Ambulatory Visit: Payer: Managed Care, Other (non HMO)

## 2020-10-05 DIAGNOSIS — Z20822 Contact with and (suspected) exposure to covid-19: Secondary | ICD-10-CM

## 2020-10-06 LAB — NOVEL CORONAVIRUS, NAA: SARS-CoV-2, NAA: NOT DETECTED

## 2020-10-06 LAB — SARS-COV-2, NAA 2 DAY TAT

## 2020-10-18 ENCOUNTER — Ambulatory Visit: Payer: Managed Care, Other (non HMO) | Admitting: Internal Medicine

## 2020-10-27 ENCOUNTER — Other Ambulatory Visit: Payer: Self-pay

## 2020-10-27 ENCOUNTER — Encounter: Payer: Self-pay | Admitting: Internal Medicine

## 2020-10-27 ENCOUNTER — Ambulatory Visit (INDEPENDENT_AMBULATORY_CARE_PROVIDER_SITE_OTHER): Payer: BC Managed Care – PPO | Admitting: Internal Medicine

## 2020-10-27 VITALS — BP 148/86 | HR 49 | Temp 98.3°F | Resp 18 | Ht 69.0 in | Wt 113.0 lb

## 2020-10-27 DIAGNOSIS — Z7689 Persons encountering health services in other specified circumstances: Secondary | ICD-10-CM

## 2020-10-27 DIAGNOSIS — F419 Anxiety disorder, unspecified: Secondary | ICD-10-CM

## 2020-10-27 DIAGNOSIS — J9621 Acute and chronic respiratory failure with hypoxia: Secondary | ICD-10-CM | POA: Diagnosis not present

## 2020-10-27 DIAGNOSIS — Z720 Tobacco use: Secondary | ICD-10-CM

## 2020-10-27 DIAGNOSIS — Z23 Encounter for immunization: Secondary | ICD-10-CM

## 2020-10-27 DIAGNOSIS — J441 Chronic obstructive pulmonary disease with (acute) exacerbation: Secondary | ICD-10-CM | POA: Diagnosis not present

## 2020-10-27 DIAGNOSIS — J9611 Chronic respiratory failure with hypoxia: Secondary | ICD-10-CM | POA: Insufficient documentation

## 2020-10-27 DIAGNOSIS — Z122 Encounter for screening for malignant neoplasm of respiratory organs: Secondary | ICD-10-CM

## 2020-10-27 MED ORDER — PREDNISONE 10 MG (21) PO TBPK
ORAL_TABLET | ORAL | 0 refills | Status: DC
Start: 2020-10-27 — End: 2020-12-08

## 2020-10-27 MED ORDER — TRELEGY ELLIPTA 200-62.5-25 MCG/INH IN AEPB
1.0000 | INHALATION_SPRAY | Freq: Every day | RESPIRATORY_TRACT | 1 refills | Status: DC
Start: 2020-10-27 — End: 2020-12-08

## 2020-10-27 NOTE — Progress Notes (Signed)
New Patient Office Visit  Subjective:  Patient ID: Jeffrey Krueger, male    DOB: 01-07-1956  Age: 65 y.o. MRN: [id number]  CC:  Chief Complaint  Patient presents with  . New Patient (Initial Visit)    New patient has been sob for 1 month since being in the hospital has also been having body aches and a really bad cramp in his neck for about 3 weeks     HPI Jeffrey Krueger is a 65 year old male with PMH of COPD, hypoxic respiratory failure, anxiety and tobacco abuse who presents for establishing care.  He was recently admitted for pneumonia and COPD exacerbation in [place]. He was treated with IV antibiotic, steroids and was discharged with home oxygen. He has been using 3 l home O2 for hypoxia. He uses Trelegy, but has ran out of it for about a month.  Since then, he has been using his albuterol inhaler more frequently, but still has dyspnea and wheezing.  He denies any fever, chills, chest pain, hemoptysis or palpitations.  He denies any recent weight loss.  He smokes about 0.5 pack/day and has been trying to cut down smoking.  He is warned not to smoke while using home oxygen.  He understands the consequences of smoking while using home oxygen and agrees not to smoke while on home oxygen.  He was placed on Wellbutrin for anxiety in the past, which was given also to help with smoking cessation.  He is counseled to cut down/quit smoking.  He has been physically deconditioned due to advanced COPD and is not able to ambulate at home without being dyspneic.  He is going to get a PFT for physical disability assessment.  Past Medical History:  Diagnosis Date  . COPD (chronic obstructive pulmonary disease) (HCC)   . Headache(784.0)   . Shortness of breath     Past Surgical History:  Procedure Laterality Date  . BACK SURGERY      History reviewed. No pertinent family history.  Social History   Socioeconomic History  . Marital status: Married    Spouse name: Not on file  . Number of  children: Not on file  . Years of education: Not on file  . Highest education level: Not on file  Occupational History  . Not on file  Tobacco Use  . Smoking status: Current Every Day Smoker    Packs/day: 2.00    Years: 44.00    Pack years: 88.00    Types: Cigarettes    Start date: 02/06/1974  . Smokeless tobacco: Never Used  . Tobacco comment: Spoke with patient about his need to quit smoking.   Substance and Sexual Activity  . Alcohol use: No    Alcohol/week: 0.0 standard drinks  . Drug use: No  . Sexual activity: Not on file  Other Topics Concern  . Not on file  Social History Narrative  . Not on file   Social Determinants of Health   Financial Resource Strain: Not on file  Food Insecurity: Not on file  Transportation Needs: Not on file  Physical Activity: Not on file  Stress: Not on file  Social Connections: Not on file  Intimate Partner Violence: Not on file    ROS Review of Systems  Constitutional: Positive for fatigue. Negative for chills and fever.  HENT: Negative for congestion and sore throat.   Eyes: Negative for pain and discharge.  Respiratory: Positive for cough, shortness of breath and wheezing.   Cardiovascular: Negative for chest  pain and palpitations.  Gastrointestinal: Negative for constipation, diarrhea, nausea and vomiting.  Endocrine: Negative for polydipsia and polyuria.  Genitourinary: Negative for dysuria and hematuria.  Musculoskeletal: Negative for neck pain and neck stiffness.  Skin: Negative for rash.  Neurological: Negative for dizziness, weakness, numbness and headaches.  Psychiatric/Behavioral: Positive for sleep disturbance. Negative for agitation and behavioral problems.    Objective:   Today's Vitals: BP (!) 148/86 (BP Location: Right Arm, Patient Position: Sitting, Cuff Size: Normal)   Pulse (!) 49   Temp 98.3 F (36.8 C) (Oral)   Resp 18   Ht 5\' 9"  (1.753 m)   Wt 113 lb (51.3 kg)   SpO2 91%   BMI 16.69 kg/m   Physical  Exam Vitals reviewed.  Constitutional:      General: He is not in acute distress.    Appearance: He is not diaphoretic.     Comments: Sitting in wheelchair  HENT:     Head: Normocephalic and atraumatic.     Nose: Nose normal.     Mouth/Throat:     Mouth: Mucous membranes are moist.  Eyes:     General: No scleral icterus.    Extraocular Movements: Extraocular movements intact.     Pupils: Pupils are equal, round, and reactive to light.  Cardiovascular:     Rate and Rhythm: Normal rate and regular rhythm.     Pulses: Normal pulses.     Heart sounds: Normal heart sounds. No murmur heard.   Pulmonary:     Breath sounds: Wheezing (DIffuse, b/l) present. No rales.  Abdominal:     Palpations: Abdomen is soft.     Tenderness: There is no abdominal tenderness.  Musculoskeletal:     Cervical back: Neck supple. No tenderness.     Right lower leg: No edema.     Left lower leg: No edema.  Skin:    General: Skin is warm.     Findings: No rash.  Neurological:     General: No focal deficit present.     Mental Status: He is alert and oriented to person, place, and time.     Sensory: No sensory deficit.     Motor: No weakness.  Psychiatric:        Mood and Affect: Mood normal.        Behavior: Behavior normal.      Assessment & Plan:   Problem List Items Addressed This Visit      Respiratory   COPD exacerbation (HCC)   Relevant Medications   TRELEGY ELLIPTA 200-62.5-25 MCG/INH AEPB   predniSONE (STERAPRED UNI-PAK 21 TAB) 10 MG (21) TBPK tablet   Other Relevant Orders   Ambulatory referral to Pulmonology   COPD with exacerbation (HCC)    On Trelegy and PRN Albuterol Ran out of Trelegy for about a month Refilled Trelegy Prescribed steroid dosepak Referred to Pulmonology      Relevant Medications   TRELEGY ELLIPTA 200-62.5-25 MCG/INH AEPB   predniSONE (STERAPRED UNI-PAK 21 TAB) 10 MG (21) TBPK tablet   Acute on chronic respiratory failure with hypoxia (HCC)    Has been  2-3 l O2 at home since hospitalization for COPD exacerbation in 08/2020 Underlying COPD and continuous tobacco abuse Steroid dosepak for COPD exacerbation        Other   Tobacco abuse    Smokes 0.5 pack/day currently  Asked about quitting: confirms that he currently smokes cigarettes Advise to quit smoking: Educated about QUITTING to reduce the risk of cancer, cardio  and cerebrovascular disease. Assess willingness: Unwilling to quit at this time, but is working on cutting back. Assist with counseling and pharmacotherapy: Counseled for 5 minutes and literature provided. On Wellbutrin. Arrange for follow up: follow up in 3 months and continue to offer help.  Advised for staying away from cigarettes while on home oxygen.      Encounter to establish care - Primary    Care established Previous chart reviewed History and medications reviewed with the patient       Other Visit Diagnoses    Need for immunization against influenza       Relevant Orders   Flu Vaccine QUAD 36+ mos IM (Completed)   Encounter for screening for lung cancer       Relevant Orders   CT CHEST LUNG CA SCREEN LOW DOSE W/O CM      Outpatient Encounter Medications as of 10/27/2020  Medication Sig  . albuterol (PROVENTIL HFA;VENTOLIN HFA) 108 (90 Base) MCG/ACT inhaler INHALE 1-2 PUFFS INTO THE LUNGS EVERY 6 (SIX) HOURS AS NEEDED FOR WHEEZING OR SHORTNESS OF BREATH.  Marland Kitchen albuterol (PROVENTIL) (2.5 MG/3ML) 0.083% nebulizer solution inhale contents of 1 vial in nebulizer every 4 hours if needed for wheezing  . buPROPion (WELLBUTRIN SR) 150 MG 12 hr tablet Take 1 tablet (150 mg total) by mouth 2 (two) times daily.  . Misc Natural Products (SINUS FORMULA) TABS Take 1 tablet by mouth every 8 (eight) hours as needed (for sinus congestion).  . predniSONE (STERAPRED UNI-PAK 21 TAB) 10 MG (21) TBPK tablet Take as package instructions.  . traZODone (DESYREL) 150 MG tablet trazodone 150 mg tablet  TAKE 1 TABLET BY MOUTH EVERY  EVENING  . [DISCONTINUED] TRELEGY ELLIPTA 200-62.5-25 MCG/INH AEPB INHALE 1 PUFF ONCE DAILY FOR COPD  . TRELEGY ELLIPTA 200-62.5-25 MCG/INH AEPB Inhale 1 puff into the lungs daily.  . [DISCONTINUED] ALPRAZolam (XANAX) 1 MG tablet 0.5 to 1 ( No more than BID as directed) (Patient not taking: Reported on 10/27/2020)  . [DISCONTINUED] budesonide-formoterol (SYMBICORT) 160-4.5 MCG/ACT inhaler Inhale 2 puffs into the lungs 2 (two) times daily. (Patient not taking: Reported on 10/27/2020)  . [DISCONTINUED] buPROPion (ZYBAN) 150 MG 12 hr tablet TAKE 2 TABLETS BY MOUTH TWICE DAILY (Patient not taking: Reported on 10/27/2020)  . [DISCONTINUED] INCRUSE ELLIPTA 62.5 MCG/INH AEPB TAKE 1 PUFF BY MOUTH EVERY DAY (Patient not taking: Reported on 10/27/2020)  . [DISCONTINUED] predniSONE (DELTASONE) 20 MG tablet 3 po qd x 2 d then 2 po qd x 2 d then 1 po qd x 2 d (Patient not taking: Reported on 10/27/2020)   No facility-administered encounter medications on file as of 10/27/2020.    Follow-up: Return in about 6 weeks (around 12/08/2020) for PCV13 and COPD follow up.   Anabel Halon, MD

## 2020-10-27 NOTE — Assessment & Plan Note (Signed)
On Trelegy and PRN Albuterol Ran out of Trelegy for about a month Refilled Trelegy Prescribed steroid dosepak Referred to Pulmonology

## 2020-10-27 NOTE — Assessment & Plan Note (Signed)
Care established Previous chart reviewed History and medications reviewed with the patient 

## 2020-10-27 NOTE — Patient Instructions (Signed)
Please continue taking medications as prescribed.  Please continue efforts to cut down smoking.  Please use home oxygen especially while ambulating.  Please start taking Prednisone as prescribed.

## 2020-10-27 NOTE — Assessment & Plan Note (Signed)
Has been 2-3 l O2 at home since hospitalization for COPD exacerbation in 08/2020 Underlying COPD and continuous tobacco abuse Steroid dosepak for COPD exacerbation

## 2020-10-27 NOTE — Assessment & Plan Note (Addendum)
Smokes 0.5 pack/day currently  Asked about quitting: confirms that he currently smokes cigarettes Advise to quit smoking: Educated about QUITTING to reduce the risk of cancer, cardio and cerebrovascular disease. Assess willingness: Unwilling to quit at this time, but is working on cutting back. Assist with counseling and pharmacotherapy: Counseled for 5 minutes and literature provided. On Wellbutrin. Arrange for follow up: follow up in 3 months and continue to offer help.  Advised for staying away from cigarettes while on home oxygen. 

## 2020-10-28 ENCOUNTER — Other Ambulatory Visit: Payer: Self-pay | Admitting: Acute Care

## 2020-10-28 DIAGNOSIS — Z720 Tobacco use: Secondary | ICD-10-CM

## 2020-10-29 ENCOUNTER — Telehealth: Payer: Self-pay | Admitting: Acute Care

## 2020-10-29 DIAGNOSIS — F419 Anxiety disorder, unspecified: Secondary | ICD-10-CM | POA: Insufficient documentation

## 2020-10-29 NOTE — Assessment & Plan Note (Signed)
On Wellbutrin 

## 2020-10-29 NOTE — Telephone Encounter (Signed)
Called and spoke with pt and he stated that he was calling us back about an appt for a CT scan.  Will forward this message over to [facility] to follow up on CT

## 2020-11-01 NOTE — Telephone Encounter (Signed)
I have spoke with Jeffrey Krueger and his LCS CT has been scheduled at [facility] on 11/25/2020 @ 4:00pm and he is aware of the appt and location

## 2020-11-01 NOTE — Telephone Encounter (Signed)
Will forward to [place] as pt is returning her call.

## 2020-11-25 ENCOUNTER — Ambulatory Visit (HOSPITAL_COMMUNITY)
Admission: RE | Admit: 2020-11-25 | Discharge: 2020-11-25 | Disposition: A | Discharge status: Home / Self Care | Payer: Self-pay | Source: Ambulatory Visit | Attending: Acute Care | Admitting: Acute Care

## 2020-11-25 ENCOUNTER — Other Ambulatory Visit: Payer: Self-pay

## 2020-11-25 DIAGNOSIS — Z720 Tobacco use: Secondary | ICD-10-CM | POA: Insufficient documentation

## 2020-12-03 ENCOUNTER — Telehealth: Payer: Self-pay | Admitting: Acute Care

## 2020-12-03 DIAGNOSIS — R911 Solitary pulmonary nodule: Secondary | ICD-10-CM

## 2020-12-03 NOTE — Progress Notes (Signed)
Jeffrey Krueger, I have called the patient to give him the results of his low dose CT. There was no answer. I have left a HIPPA compliant message on his VM, I have asked him to call the office , and we will go over his scan results with him. I left the office number on the VM. We will await the patient's return call. If he does not call we will call him again Monday.

## 2020-12-06 LAB — PULMONARY FUNCTION TEST
FEV1/FVC: 77 %
FEV1: 3.33 L
FVC: 4.34 L

## 2020-12-06 MED ORDER — LEVOFLOXACIN 500 MG PO TABS: 500.0000 mg | ORAL_TABLET | Freq: Every day | ORAL | 0 refills | Status: DC

## 2020-12-06 NOTE — Telephone Encounter (Signed)
Spoke to pt and advised of recommendations per Dr Delton Coombes. [facility] [place] [facility]. Order placed for Super D Chest CT in 4 weeks. Will schedule f/u visit with Dr Delton Coombes once CT has been scheduled. Copy of CT faxed to PCP with f/u plans noted.  Will forward to Kandice Robinsons, NP as an [place] Childes.

## 2020-12-06 NOTE — Telephone Encounter (Signed)
I reviewed his scan. New RUL opacity, difficult to say whether this is infectious vs malignancy. New since 1 yr ago.   Recommend >> treat him with levaquin 500mg  qd x 7 days, repeat CT chest (super D Ct, no contrast) in about 4 weeks. Then he needs an OV with RB to review the scan. OK to use one of my blocked nodule slots.

## 2020-12-06 NOTE — Telephone Encounter (Signed)
Called and spoke with pt regarding Lung screening CT results.  I advised pt that Kandice Robinsons, NP is out of the office and I would forward this to Dr Delton Coombes to get his recommendations on the next step for f/u .  Pt reports that he has been having body aches for the past 2 months but no fever. Pt also reports weight loss of about 40 lbs over the past 4-5 months. Last covid test was negative on 10/05/20. Pt has not had covid vaccines. Dr Delton Coombes could you please Francee Piccolo on CT results below since Kandice Robinsons, NP is out of the office this week.    Lungs/Pleura: Moderate to advanced changes of centrilobular and paraseptal emphysema. No pleural effusion identified. Previously noted lung nodules are stable compared with the previous exam measuring up to 6.1 mm. There is a new irregular area of airspace density in the posterior right upper lobe which has an equivalent diameter of 21.2, image 55/4. Air bronchograms are noted throughout this structure which may reflect an area of inflammation or infection. Malignancy would be difficult to exclude.  Upper Abdomen: No acute abnormality.  Musculoskeletal: No chest wall mass or suspicious bone lesions identified.  IMPRESSION: 1. Lung-RADS 4B, suspicious. Additional imaging evaluation or consultation with Pulmonology or Thoracic Surgery recommended.  Aortic Atherosclerosis (ICD10-I70.0) and Emphysema (ICD10-J43.9).

## 2020-12-07 NOTE — Telephone Encounter (Signed)
Thanks so much Angelique Blonder and Dr, Delton Coombes. Is it possible for me to get the results of the repeat CT so we can make sure if this clears with treatment, the patient falls back into the lung cancer screeinng program ?  Thanks so much.

## 2020-12-08 ENCOUNTER — Ambulatory Visit (INDEPENDENT_AMBULATORY_CARE_PROVIDER_SITE_OTHER): Payer: Medicaid Other | Admitting: Internal Medicine

## 2020-12-08 ENCOUNTER — Other Ambulatory Visit: Payer: Self-pay

## 2020-12-08 ENCOUNTER — Encounter: Payer: Self-pay | Admitting: Internal Medicine

## 2020-12-08 VITALS — BP 130/78 | HR 63 | Resp 18 | Ht 69.0 in | Wt 143.4 lb

## 2020-12-08 DIAGNOSIS — Z0000 Encounter for general adult medical examination without abnormal findings: Secondary | ICD-10-CM

## 2020-12-08 DIAGNOSIS — F419 Anxiety disorder, unspecified: Secondary | ICD-10-CM | POA: Diagnosis not present

## 2020-12-08 DIAGNOSIS — J449 Chronic obstructive pulmonary disease, unspecified: Secondary | ICD-10-CM

## 2020-12-08 DIAGNOSIS — Z23 Encounter for immunization: Secondary | ICD-10-CM

## 2020-12-08 DIAGNOSIS — Z0001 Encounter for general adult medical examination with abnormal findings: Secondary | ICD-10-CM | POA: Diagnosis not present

## 2020-12-08 DIAGNOSIS — J9611 Chronic respiratory failure with hypoxia: Secondary | ICD-10-CM

## 2020-12-08 DIAGNOSIS — Z131 Encounter for screening for diabetes mellitus: Secondary | ICD-10-CM

## 2020-12-08 MED ORDER — TRAZODONE HCL 150 MG PO TABS: ORAL_TABLET | ORAL | 1 refills | Status: DC

## 2020-12-08 MED ORDER — TRELEGY ELLIPTA 200-62.5-25 MCG/INH IN AEPB: 1.0000 | INHALATION_SPRAY | Freq: Every day | RESPIRATORY_TRACT | 1 refills | Status: DC

## 2020-12-08 NOTE — Patient Instructions (Signed)
Please continue taking Levofloxacin as prescribed.  Continue to use Trelegy regularly.  Please follow up with Pulmonologist as scheduled.

## 2020-12-08 NOTE — Assessment & Plan Note (Signed)
On Wellbutrin Refilled Tramadol for insomnia

## 2020-12-08 NOTE — Progress Notes (Signed)
Established Patient Office Visit  Subjective:  Patient ID: Jeffrey Krueger, male    DOB: 1956-06-05  Age: 65 y.o. MRN: [id number]  CC:  Chief Complaint  Patient presents with  . Follow-up    6 weeks follow up still having body aches soreness in neck and lower back this is no better     HPI Jeffrey Krueger is a 65 year old male with PMH of COPD, hypoxic respiratory failure, anxiety and tobacco abuse who presents for follow up of his chronic medical conditions.  He has been using trelegy regularly for his COPD. He still has to use albuterol inhaler about 1 or 2 times in a day.  He had CT scan of the chest done, which showed pulmonary nodule and pulmonary infiltrate, for which she was prescribed levofloxacin.  He is to get a follow-up CT scan of the chest for pulmonary nodule.  He has not had Pulmonology visit yet.  He currently denies any fever, chills, chest pain, hemoptysis or palpitations.  He continues to have chronic cough chronic cough, but denies any change in quality or quantity recently.  He has chronic dyspnea and wheezing, for which she uses albuterol inhaler.  He complains of chronic neck and back pain, which is better with Tylenol/Ibuprofen.   Past Medical History:  Diagnosis Date  . COPD (chronic obstructive pulmonary disease) ([place])   . Headache(784.0)   . Shortness of breath     Past Surgical History:  Procedure Laterality Date  . BACK SURGERY      History reviewed. No pertinent family history.  Social History   Socioeconomic History  . Marital status: Married    Spouse name: Not on file  . Number of children: Not on file  . Years of education: Not on file  . Highest education level: Not on file  Occupational History  . Not on file  Tobacco Use  . Smoking status: Former Smoker    Packs/day: 2.00    Years: 44.00    Pack years: 88.00    Types: Cigarettes    Start date: 02/06/1974  . Smokeless tobacco: Never Used  . Tobacco comment: Spoke with patient about  his need to quit smoking.   Substance and Sexual Activity  . Alcohol use: No    Alcohol/week: 0.0 standard drinks  . Drug use: No  . Sexual activity: Not on file  Other Topics Concern  . Not on file  Social History Narrative  . Not on file   Social Determinants of Health   Financial Resource Strain: Not on file  Food Insecurity: Not on file  Transportation Needs: Not on file  Physical Activity: Not on file  Stress: Not on file  Social Connections: Not on file  Intimate Partner Violence: Not on file    Outpatient Medications Prior to Visit  Medication Sig Dispense Refill  . albuterol (PROVENTIL HFA;VENTOLIN HFA) 108 (90 Base) MCG/ACT inhaler INHALE 1-2 PUFFS INTO THE LUNGS EVERY 6 (SIX) HOURS AS NEEDED FOR WHEEZING OR SHORTNESS OF BREATH. 25.5 Inhaler 1  . albuterol (PROVENTIL) (2.5 MG/3ML) 0.083% nebulizer solution inhale contents of 1 vial in nebulizer every 4 hours if needed for wheezing 100 vial 1  . buPROPion (WELLBUTRIN SR) 150 MG 12 hr tablet Take 1 tablet (150 mg total) by mouth 2 (two) times daily. 60 tablet 2  . levofloxacin (LEVAQUIN) 500 MG tablet Take 1 tablet (500 mg total) by mouth daily. 7 tablet 0  . Misc Natural Products (SINUS FORMULA) TABS Take  1 tablet by mouth every 8 (eight) hours as needed (for sinus congestion).    . predniSONE (STERAPRED UNI-PAK 21 TAB) 10 MG (21) TBPK tablet Take as package instructions. 1 each 0  . traZODone (DESYREL) 150 MG tablet trazodone 150 mg tablet  TAKE 1 TABLET BY MOUTH EVERY EVENING    . TRELEGY ELLIPTA 200-62.5-25 MCG/INH AEPB Inhale 1 puff into the lungs daily. 60 each 1   No facility-administered medications prior to visit.    No Known Allergies  ROS Review of Systems  Constitutional: Positive for fatigue. Negative for chills and fever.  HENT: Negative for congestion and sore throat.   Eyes: Negative for pain and discharge.  Respiratory: Positive for cough, shortness of breath and wheezing.   Cardiovascular:  Negative for chest pain and palpitations.  Gastrointestinal: Negative for constipation, diarrhea, nausea and vomiting.  Endocrine: Negative for polydipsia and polyuria.  Genitourinary: Negative for dysuria and hematuria.  Musculoskeletal: Negative for neck pain and neck stiffness.  Skin: Negative for rash.  Neurological: Negative for dizziness, weakness, numbness and headaches.  Psychiatric/Behavioral: Positive for sleep disturbance. Negative for agitation and behavioral problems.      Objective:    Physical Exam Vitals reviewed.  Constitutional:      General: He is not in acute distress.    Appearance: He is not diaphoretic.  HENT:     Head: Normocephalic and atraumatic.     Nose: Nose normal.     Mouth/Throat:     Mouth: Mucous membranes are moist.  Eyes:     General: No scleral icterus.    Extraocular Movements: Extraocular movements intact.     Pupils: Pupils are equal, round, and reactive to light.  Cardiovascular:     Rate and Rhythm: Normal rate and regular rhythm.     Pulses: Normal pulses.     Heart sounds: Normal heart sounds. No murmur heard.   Pulmonary:     Breath sounds: Wheezing (Mild) present. No rales.  Musculoskeletal:     Cervical back: Neck supple. No tenderness.     Right lower leg: No edema.     Left lower leg: No edema.  Skin:    General: Skin is warm.     Findings: No rash.  Neurological:     General: No focal deficit present.     Mental Status: He is alert and oriented to person, place, and time.     Sensory: No sensory deficit.     Motor: No weakness.  Psychiatric:        Mood and Affect: Mood normal.        Behavior: Behavior normal.     BP 130/78 (BP Location: Right Arm, Patient Position: Sitting, Cuff Size: Normal)   Pulse 63   Resp 18   Ht _0  (1.753 m)   Wt 143 lb 6.4 oz (65 kg)   SpO2 98%   BMI 21.18 kg/m  Wt Readings from Last 3 Encounters:  12/08/20 143 lb 6.4 oz (65 kg)  10/27/20 113 lb (51.3 kg)  01/07/18 161 lb 6.4  oz (73.2 kg)     Health Maintenance Due  Topic Date Due  . Hepatitis C Screening  Never done  . HIV Screening  Never done  . TETANUS/TDAP  Never done  . COLONOSCOPY (Pts 45-65yr Insurance coverage will need to be confirmed)  Never done    There are no preventive care reminders to display for this patient.  Lab Results  Component Value Date  TSH 0.986 11/29/2015   Lab Results  Component Value Date   WBC 23.3 (H) 07/07/2016   HGB 15.2 07/07/2016   HCT 44.2 07/07/2016   MCV 93.6 07/07/2016   PLT 216 07/07/2016   Lab Results  Component Value Date   NA 138 07/07/2016   K 3.9 07/07/2016   CO2 24 07/07/2016   GLUCOSE 183 (H) 07/07/2016   BUN 26 (H) 07/07/2016   CREATININE 1.13 07/07/2016   BILITOT 1.2 06/12/2013   ALKPHOS 86 06/12/2013   AST 18 06/12/2013   ALT 22 06/12/2013   PROT 6.8 06/12/2013   ALBUMIN 4.0 06/12/2013   CALCIUM 8.9 07/07/2016   ANIONGAP 10 07/07/2016   Lab Results  Component Value Date   CHOL 152 06/18/2013   Lab Results  Component Value Date   HDL 55 06/18/2013   Lab Results  Component Value Date   LDLCALC 79 06/18/2013   Lab Results  Component Value Date   TRIG 89 06/18/2013   Lab Results  Component Value Date   CHOLHDL 2.8 06/18/2013   No results found for: HGBA1C    Assessment & Plan:   Problem List Items Addressed This Visit      Respiratory   COPD (chronic obstructive pulmonary disease) ([place]) - Primary    On Trelegy and PRN Albuterol Refilled Trelegy F/u with Pulmonology      Relevant Medications   TRELEGY ELLIPTA 200-62.5-25 MCG/INH AEPB   Other Relevant Orders   CBC   CMP14+EGFR   Lipid panel   Chronic respiratory failure with hypoxia (HCC)    Has been 2-3 l O2 PRN at home Underlying COPD and continuous tobacco abuse        Other   Anxiety    On Wellbutrin Refilled Tramadol for insomnia      Relevant Medications   traZODone (DESYREL) 150 MG tablet    Other Visit Diagnoses    Need for viral  immunization       Relevant Orders   Pneumococcal conjugate vaccine 13-valent (Completed)   Screening for diabetes mellitus (DM)       Relevant Orders   HgB A1c      Meds ordered this encounter  Medications  . TRELEGY ELLIPTA 200-62.5-25 MCG/INH AEPB    Sig: Inhale 1 puff into the lungs daily.    Dispense:  60 each    Refill:  1  . traZODone (DESYREL) 150 MG tablet    Sig: TAKE 1 TABLET BY MOUTH EVERY EVENING    Dispense:  90 tablet    Refill:  1    Follow-up: Return in about 4 months (around 04/09/2021) for COPD.    Lindell Spar, MD

## 2020-12-08 NOTE — Telephone Encounter (Signed)
Sarah, I have pt on my tickle list and will make sure you get CT results as well.

## 2020-12-08 NOTE — Assessment & Plan Note (Signed)
On Trelegy and PRN Albuterol Refilled Trelegy F/u with Pulmonology

## 2020-12-08 NOTE — Assessment & Plan Note (Signed)
Has been 2-3 l O2 PRN at home Underlying COPD and continuous tobacco abuse

## 2020-12-09 NOTE — Telephone Encounter (Signed)
Thanks so much. 

## 2021-01-04 ENCOUNTER — Ambulatory Visit (HOSPITAL_COMMUNITY): Payer: Self-pay

## 2021-01-12 ENCOUNTER — Other Ambulatory Visit: Payer: Self-pay

## 2021-01-12 ENCOUNTER — Ambulatory Visit (INDEPENDENT_AMBULATORY_CARE_PROVIDER_SITE_OTHER): Payer: Medicaid Other | Admitting: Nurse Practitioner

## 2021-01-12 ENCOUNTER — Encounter: Payer: Self-pay | Admitting: Nurse Practitioner

## 2021-01-12 DIAGNOSIS — J9611 Chronic respiratory failure with hypoxia: Secondary | ICD-10-CM | POA: Diagnosis not present

## 2021-01-12 MED ORDER — BREZTRI AEROSPHERE 160-9-4.8 MCG/ACT IN AERO: 2.0000 | INHALATION_SPRAY | Freq: Two times a day (BID) | RESPIRATORY_TRACT | 11 refills | Status: DC

## 2021-01-12 NOTE — Assessment & Plan Note (Addendum)
-  uses 2-3 Lpm via [place] at home at baseline -was discharged with doxycycline and PO steroids -followed by pulm. -was prescribed trelegy, but he was unable to afford it; Rx. Breztri; will attempt [employer] for drug savings -still smoking, and he is not interested in stopping today -he refuses lab draw d/t self-pay status for the next month

## 2021-01-12 NOTE — Patient Instructions (Signed)
Please contact [employer] for savings on your [facility] prescription.  They can be reached at 1-800-AZandMe [phone])

## 2021-01-12 NOTE — Progress Notes (Signed)
Established Patient Office Visit  Subjective:  Patient ID: ESTIBEN MIZUNO, male    DOB: Nov 19, 1955  Age: 65 y.o. MRN: [id number]  CC:  Chief Complaint  Patient presents with  . Transitions Of Care    Pt dc'd from [facility] on 01/05/21 for COPD exacerbation and SOB. Pt currently on doxycycline and prednisone.     HPI DEZMEN ALCOCK presents for hospital follow-up for COPD exacerbation.  Admit date: 01/03/2021  Discharge date: 01/05/21  Discharge to: Home  Megan Jovany Disano is a 65 y.o. male that presented to [facility] and was admitted for COPD exacerbation (CMS-HCC) on 01/03/2021 8:06 PM . Patient presented, via EMS, with complaint of worsening SOB. This despite self administration with holding nebulizer treatment. He reportedly received a pneumonia shot 4 days ago, and apparently has become more short of breath since then. Per EMS, patient was given a nebulizer treatment and a dose of Solu-Medrol. Upon arrival here, patient was afebrile, hemodynamically stable, and with initial O2 sats 95% on 2 L. Corresponding ABG yielded pH 7.32/PCO2 66/PO2 103/HCO3 33.5. Laboratory data essentially unremarkable, including NL WBC. CXR: Stable emphysema, no acute airspace disease  COPD exacerbation with acute on chronic hypercarbic respiratory failure- He received IV steroids in the ED followed by oral steroids along with scheduled nebs and doxycycline. Initially placed on BiPAP and subsequently weaned to 2 L nasal cannula oxygen which is his baseline. Repeat ABG ordered but patient refused to have it checked. He reports feeling better and feels ready for discharged home. He will continue to taper steroid slowly after discharge. Also given prescription for doxycycline to be continued to complete the course.  Tobacco use- Patient still smoking 1 pack a day, smoking cessation counseling done and patient states that he is going to try to quit smoking. Nicotine patch prescription being given at  discharge.  He has home O2 concentrator.    Past Medical History:  Diagnosis Date  . COPD (chronic obstructive pulmonary disease) (HCC)   . Headache(784.0)   . Shortness of breath     Past Surgical History:  Procedure Laterality Date  . BACK SURGERY      History reviewed. No pertinent family history.  Social History   Socioeconomic History  . Marital status: Married    Spouse name: Not on file  . Number of children: Not on file  . Years of education: Not on file  . Highest education level: Not on file  Occupational History  . Not on file  Tobacco Use  . Smoking status: Former Smoker    Packs/day: 2.00    Years: 44.00    Pack years: 88.00    Types: Cigarettes    Start date: 02/06/1974  . Smokeless tobacco: Never Used  . Tobacco comment: Spoke with patient about his need to quit smoking.   Substance and Sexual Activity  . Alcohol use: No    Alcohol/week: 0.0 standard drinks  . Drug use: No  . Sexual activity: Not on file  Other Topics Concern  . Not on file  Social History Narrative  . Not on file   Social Determinants of Health   Financial Resource Strain: Not on file  Food Insecurity: Not on file  Transportation Needs: Not on file  Physical Activity: Not on file  Stress: Not on file  Social Connections: Not on file  Intimate Partner Violence: Not on file    Outpatient Medications Prior to Visit  Medication Sig Dispense Refill  . albuterol (  PROVENTIL HFA;VENTOLIN HFA) 108 (90 Base) MCG/ACT inhaler INHALE 1-2 PUFFS INTO THE LUNGS EVERY 6 (SIX) HOURS AS NEEDED FOR WHEEZING OR SHORTNESS OF BREATH. 25.5 Inhaler 1  . albuterol (PROVENTIL) (2.5 MG/3ML) 0.083% nebulizer solution inhale contents of 1 vial in nebulizer every 4 hours if needed for wheezing 100 vial 1  . buPROPion (WELLBUTRIN SR) 150 MG 12 hr tablet Take 1 tablet (150 mg total) by mouth 2 (two) times daily. 60 tablet 2  . Misc Natural Products (SINUS FORMULA) TABS Take 1 tablet by mouth every 8  (eight) hours as needed (for sinus congestion).    . traZODone (DESYREL) 150 MG tablet TAKE 1 TABLET BY MOUTH EVERY EVENING 90 tablet 1  . TRELEGY ELLIPTA 200-62.5-25 MCG/INH AEPB Inhale 1 puff into the lungs daily. 60 each 1  . levofloxacin (LEVAQUIN) 500 MG tablet Take 1 tablet (500 mg total) by mouth daily. (Patient not taking: Reported on 01/12/2021) 7 tablet 0   No facility-administered medications prior to visit.    No Known Allergies  ROS Review of Systems  Constitutional: Negative.   Respiratory: Positive for shortness of breath and wheezing.   Psychiatric/Behavioral: Negative.       Objective:    Physical Exam Constitutional:      General: He is not in acute distress.    Appearance: He is ill-appearing.  Cardiovascular:     Rate and Rhythm: Normal rate and regular rhythm.     Pulses: Normal pulses.     Heart sounds: Normal heart sounds.  Pulmonary:     Effort: Pulmonary effort is normal. No respiratory distress.     Breath sounds: Wheezing present.     Comments: Accessory muscle use; clubbed fingernails; wearing O2 at 3L Neurological:     Mental Status: He is alert.  Psychiatric:        Mood and Affect: Mood normal.        Behavior: Behavior normal.        Thought Content: Thought content normal.        Judgment: Judgment normal.     BP (!) 120/95   Pulse (!) 108   Temp 98 F (36.7 C)   Resp (!) 22   Ht 5\' 9"  (1.753 m)   Wt 140 lb (63.5 kg)   SpO2 98%   BMI 20.67 kg/m  Wt Readings from Last 3 Encounters:  01/12/21 140 lb (63.5 kg)  12/08/20 143 lb 6.4 oz (65 kg)  10/27/20 113 lb (51.3 kg)     Health Maintenance Due  Topic Date Due  . Hepatitis C Screening  Never done  . COVID-19 Vaccine (1) Never done  . HIV Screening  Never done  . TETANUS/TDAP  Never done  . COLONOSCOPY (Pts 45-37yrs Insurance coverage will need to be confirmed)  Never done    There are no preventive care reminders to display for this patient.  Lab Results  Component  Value Date   TSH 0.986 11/29/2015   Lab Results  Component Value Date   WBC 23.3 (H) 07/07/2016   HGB 15.2 07/07/2016   HCT 44.2 07/07/2016   MCV 93.6 07/07/2016   PLT 216 07/07/2016   Lab Results  Component Value Date   NA 138 07/07/2016   K 3.9 07/07/2016   CO2 24 07/07/2016   GLUCOSE 183 (H) 07/07/2016   BUN 26 (H) 07/07/2016   CREATININE 1.13 07/07/2016   BILITOT 1.2 06/12/2013   ALKPHOS 86 06/12/2013   AST 18 06/12/2013  ALT 22 06/12/2013   PROT 6.8 06/12/2013   ALBUMIN 4.0 06/12/2013   CALCIUM 8.9 07/07/2016   ANIONGAP 10 07/07/2016   Lab Results  Component Value Date   CHOL 152 06/18/2013   Lab Results  Component Value Date   HDL 55 06/18/2013   Lab Results  Component Value Date   LDLCALC 79 06/18/2013   Lab Results  Component Value Date   TRIG 89 06/18/2013   Lab Results  Component Value Date   CHOLHDL 2.8 06/18/2013   No results found for: HGBA1C    Assessment & Plan:   Problem List Items Addressed This Visit      Respiratory   Chronic respiratory failure with hypoxia (HCC)    -uses 2-3 Lpm via [place] at home at baseline -was discharged with doxycycline and PO steroids -followed by pulm. -was prescribed trelegy, but he was unable to afford it; Rx. Breztri; will attempt [employer] for drug savings -still smoking, and he is not interested in stopping today -he refuses lab draw d/t self-pay status for the next month         Meds ordered this encounter  Medications  . Budeson-Glycopyrrol-Formoterol (BREZTRI AEROSPHERE) 160-9-4.8 MCG/ACT AERO    Sig: Inhale 2 puffs into the lungs in the morning and at bedtime.    Dispense:  10.7 g    Refill:  11    Follow-up: Return if symptoms worsen or fail to improve.    Heather Roberts, NP

## 2021-01-17 ENCOUNTER — Other Ambulatory Visit: Payer: Self-pay

## 2021-01-17 DIAGNOSIS — J449 Chronic obstructive pulmonary disease, unspecified: Secondary | ICD-10-CM

## 2021-01-17 MED ORDER — BREZTRI AEROSPHERE 160-9-4.8 MCG/ACT IN AERO: 2.0000 | INHALATION_SPRAY | Freq: Two times a day (BID) | RESPIRATORY_TRACT | 11 refills | Status: DC

## 2021-01-18 ENCOUNTER — Encounter: Payer: Self-pay | Admitting: Emergency Medicine

## 2021-01-18 ENCOUNTER — Ambulatory Visit (INDEPENDENT_AMBULATORY_CARE_PROVIDER_SITE_OTHER): Payer: Medicaid Other | Admitting: Emergency Medicine

## 2021-01-18 ENCOUNTER — Other Ambulatory Visit: Payer: Self-pay

## 2021-01-18 DIAGNOSIS — Z720 Tobacco use: Secondary | ICD-10-CM | POA: Diagnosis not present

## 2021-01-18 DIAGNOSIS — J9611 Chronic respiratory failure with hypoxia: Secondary | ICD-10-CM | POA: Diagnosis not present

## 2021-01-18 DIAGNOSIS — R911 Solitary pulmonary nodule: Secondary | ICD-10-CM | POA: Diagnosis not present

## 2021-01-18 DIAGNOSIS — J449 Chronic obstructive pulmonary disease, unspecified: Secondary | ICD-10-CM | POA: Diagnosis not present

## 2021-01-18 MED ORDER — BREZTRI AEROSPHERE 160-9-4.8 MCG/ACT IN AERO: 2.0000 | INHALATION_SPRAY | Freq: Two times a day (BID) | RESPIRATORY_TRACT | 0 refills | Status: DC

## 2021-01-18 NOTE — Assessment & Plan Note (Signed)
Discussed cessation with him today.  He is concerned he does not have the willpower to stop completely.  We talked instead about cutting down.  He thinks he can cut down to 10 cigarettes daily by our next visit.  We made a strategy for him to do this.

## 2021-01-18 NOTE — Assessment & Plan Note (Addendum)
Continue oxygen at 2 L/min.  We will consider repeat walking oximetry once he is on stable BD therapy to ensure that this is the appropriate long-term flow rate.

## 2021-01-18 NOTE — Progress Notes (Signed)
Subjective:    Patient ID: Jeffrey Krueger, male    DOB: March 02, 1956, 65 y.o.   MRN: [id number]  HPI 65 year old smoker (45 pack years) with a history of COPD, chronic hypoxemic respiratory failure on 2-3 l/min.  Review of notes indicates that he has been prescribed Trelegy but cannot afford, more recently [place] but has not obtained.  He was recently admitted for acute hypoxemic respiratory failure in the setting of an acute exacerbation of COPD.  Required BiPAP during that hospitalization.    He has baseline severe resting and exertional SOB. Frequent cough and wheeze. Very limited functional capacity  He participates in lung cancer screening program and had a CT chest 11/26/2020 that I have reviewed.  This shows stable previously noted lung nodules largest 6.1 mm, a new irregular area of airspace density posterior right upper lobe with air bronchograms.  He was treated with levofloxacin for possible pneumonia.  CXR 01/03/2021 at [facility] reviewed by me shows apparent resolution of his right upper lobe infiltrate that was noted on the CT chest 11/26/2020   Review of Systems As per HPI  Past Medical History:  Diagnosis Date  . COPD (chronic obstructive pulmonary disease) (HCC)   . Headache(784.0)   . Shortness of breath      No family history on file.   Social History   Socioeconomic History  . Marital status: Married    Spouse name: Not on file  . Number of children: Not on file  . Years of education: Not on file  . Highest education level: Not on file  Occupational History  . Not on file  Tobacco Use  . Smoking status: Current Every Day Smoker    Packs/day: 0.50    Years: 44.00    Pack years: 22.00    Types: Cigarettes    Start date: 02/06/1974  . Smokeless tobacco: Never Used  . Tobacco comment: Pt currently smokes 10 cigarettes a day 01/18/21 [facility]   Substance and Sexual Activity  . Alcohol use: No    Alcohol/week: 0.0 standard drinks  . Drug use: No  . Sexual  activity: Not on file  Other Topics Concern  . Not on file  Social History Narrative  . Not on file   Social Determinants of Health   Financial Resource Strain: Not on file  Food Insecurity: Not on file  Transportation Needs: Not on file  Physical Activity: Not on file  Stress: Not on file  Social Connections: Not on file  Intimate Partner Violence: Not on file     No Known Allergies   Outpatient Medications Prior to Visit  Medication Sig Dispense Refill  . albuterol (PROVENTIL HFA;VENTOLIN HFA) 108 (90 Base) MCG/ACT inhaler INHALE 1-2 PUFFS INTO THE LUNGS EVERY 6 (SIX) HOURS AS NEEDED FOR WHEEZING OR SHORTNESS OF BREATH. 25.5 Inhaler 1  . albuterol (PROVENTIL) (2.5 MG/3ML) 0.083% nebulizer solution inhale contents of 1 vial in nebulizer every 4 hours if needed for wheezing 100 vial 1  . Budeson-Glycopyrrol-Formoterol (BREZTRI AEROSPHERE) 160-9-4.8 MCG/ACT AERO Inhale 2 puffs into the lungs in the morning and at bedtime. 10.7 g 11  . buPROPion (WELLBUTRIN SR) 150 MG 12 hr tablet Take 1 tablet (150 mg total) by mouth 2 (two) times daily. 60 tablet 2  . Misc Natural Products (SINUS FORMULA) TABS Take 1 tablet by mouth every 8 (eight) hours as needed (for sinus congestion).    . traZODone (DESYREL) 150 MG tablet TAKE 1 TABLET BY MOUTH EVERY EVENING 90  tablet 1   No facility-administered medications prior to visit.        Objective:   Physical Exam Vitals:   01/18/21 1628  BP: (!) 142/80  Pulse: (!) 104  Temp: 98 F (36.7 C)  TempSrc: Temporal  SpO2: 98%  Weight: 137 lb (62.1 kg)  Height: 5\' 9"  (1.753 m)   Gen: Pleasant, cachectic, in no distress,  normal affect  ENT: No lesions,  mouth clear,  oropharynx clear, no postnasal drip  Neck: No JVD, no stridor  Lungs: Mild accessory muscle use, no crackles. He does have B wheezes on normal breath  Cardiovascular: RRR, heart sounds normal, no murmur or gallops, no peripheral edema  Musculoskeletal: No deformities, no  cyanosis or clubbing  Neuro: alert, awake, non focal  Skin: Warm, no lesions or rash      Assessment & Plan:  Lung nodule Hazy opacity on a CT chest, lung cancer screening CT, from March.  Looks to be resolved on his chest x-ray from April hospitalization.  He needs a repeat CT to ensure clearance.  If persistent then we can talk about diagnostics, bronchoscopy.  If it has resolved then we will continue regular screening through the lung cancer screening program.  COPD (chronic obstructive pulmonary disease) (HCC) Severe disease.  He was better managed when he was on Trelegy but had to be stopped due to this.  He is about to be approved for Medicare/Medicaid.  May was prescribed and he is waiting for it to arrive.  We will give him sample so he can go ahead and start it.  Continue his albuterol as needed  Chronic respiratory failure with hypoxia (HCC) Continue oxygen at 2 L/min.  We will consider repeat walking oximetry once he is on stable BD therapy to ensure that this is the appropriate long-term flow rate.  Tobacco abuse Discussed cessation with him today.  He is concerned he does not have the willpower to stop completely.  We talked instead about cutting down.  He thinks he can cut down to 10 cigarettes daily by our next visit.  We made a strategy for him to do this.  Markus Daft, MD, PhD 01/18/2021, 5:12 PM [facility] [phone] or if no answer before 7:00PM call [phone] For any issues after 7:00PM please call eLink [phone]

## 2021-01-18 NOTE — Assessment & Plan Note (Signed)
Severe disease.  He was better managed when he was on Trelegy but had to be stopped due to this.  He is about to be approved for Medicare/Medicaid.  Jeffrey Krueger was prescribed and he is waiting for it to arrive.  We will give him sample so he can go ahead and start it.  Continue his albuterol as needed

## 2021-01-18 NOTE — Assessment & Plan Note (Signed)
Hazy opacity on a CT chest, lung cancer screening CT, from March.  Looks to be resolved on his chest x-ray from April hospitalization.  He needs a repeat CT to ensure clearance.  If persistent then we can talk about diagnostics, bronchoscopy.  If it has resolved then we will continue regular screening through the lung cancer screening program.

## 2021-01-18 NOTE — Patient Instructions (Signed)
We reviewed your CT scan of the chest from March and also your chest x-ray from April.  There was an area in the right upper lobe look like pneumonia.  We need to repeat your CT scan of the chest to ensure clearance.  We will do this at [facility]. Go ahead and start Breztri 2 puffs twice a day.  Rinse and gargle after using. Keep your albuterol available use 2 puffs when needed for shortness of breath, chest tightness, wheezing. Continue your oxygen at 2 L/min at all times.  We may decide to repeat your walking oximetry testing at some point in the future once you are on stable inhaler medication. We need to work hard on decreasing your cigarettes.  Try to cut down to 10 cigarettes daily by our next visit. Follow with Jeffrey Krueger next available after your CT scan of the chest so that we can review together.

## 2021-01-27 ENCOUNTER — Other Ambulatory Visit: Payer: Self-pay | Admitting: *Deleted

## 2021-01-27 ENCOUNTER — Telehealth: Payer: Self-pay

## 2021-01-27 MED ORDER — ALBUTEROL SULFATE (2.5 MG/3ML) 0.083% IN NEBU: INHALATION_SOLUTION | RESPIRATORY_TRACT | 0 refills | Status: DC

## 2021-01-27 NOTE — Telephone Encounter (Signed)
Patient called need refill albuterol (PROVENTIL) (2.5 MG/3ML) 0.083% nebulizer solution  [facility]

## 2021-01-27 NOTE — Telephone Encounter (Signed)
Pt medication sent to pharmacy  

## 2021-02-02 ENCOUNTER — Encounter: Payer: Self-pay | Admitting: Internal Medicine

## 2021-02-02 ENCOUNTER — Other Ambulatory Visit: Payer: Self-pay

## 2021-02-02 ENCOUNTER — Telehealth (INDEPENDENT_AMBULATORY_CARE_PROVIDER_SITE_OTHER): Payer: Medicare Other | Admitting: Internal Medicine

## 2021-02-02 VITALS — Ht 69.0 in | Wt 133.0 lb

## 2021-02-02 DIAGNOSIS — J019 Acute sinusitis, unspecified: Secondary | ICD-10-CM

## 2021-02-02 DIAGNOSIS — H1013 Acute atopic conjunctivitis, bilateral: Secondary | ICD-10-CM

## 2021-02-02 MED ORDER — FLUTICASONE PROPIONATE 50 MCG/ACT NA SUSP: 2.0000 | Freq: Every day | NASAL | 6 refills | Status: DC

## 2021-02-02 MED ORDER — AMOXICILLIN-POT CLAVULANATE 875-125 MG PO TABS: 1.0000 | ORAL_TABLET | Freq: Two times a day (BID) | ORAL | 0 refills | Status: DC

## 2021-02-02 MED ORDER — OLOPATADINE HCL 0.2 % OP SOLN: 1.0000 [drp] | Freq: Every day | OPHTHALMIC | 0 refills | Status: DC

## 2021-02-02 NOTE — Patient Instructions (Signed)

## 2021-02-02 NOTE — Progress Notes (Signed)
Virtual Visit via Telephone Note   This visit type was conducted due to national recommendations for restrictions regarding the COVID-19 Pandemic (e.g. social distancing) in an effort to limit this patient's exposure and mitigate transmission in our community.  Due to his co-morbid illnesses, this patient is at least at moderate risk for complications without adequate follow up.  This format is felt to be most appropriate for this patient at this time.  The patient did not have access to video technology/had technical difficulties with video requiring transitioning to audio format only (telephone).  All issues noted in this document were discussed and addressed.  No physical exam could be performed with this format.  Evaluation Performed:  Follow-up visit  Date:  02/02/2021   ID:  Jeffrey Krueger, Jeffrey Krueger Feb 10, 1956, MRN [id number]  Patient Location: Home Provider Location: Office/Clinic  Participants: Patient Location of Patient: Home Location of Provider: Telehealth Consent was obtain for visit to be over via telehealth. I verified that I am speaking with the correct person using two identifiers.  PCP:  Anabel Halon, MD   Chief Complaint:    History of Present Illness:    Jeffrey Krueger is a 65 y.o. male who has a televisit for c/o nasal congestion and sinus pressure/pain for last 2 weeks. He has intermittent chills, but denies any fever. He also reports cough and sore throat. Denies any recent sick contacts. He reports mild dyspnea, which is chronic, for which he uses Trelegy and PRN Albuterol.  He also reports b/o eye drainage associated with eye redness for last few days.  The patient does not have symptoms concerning for COVID-19 infection (fever, chills, cough, or new shortness of breath).   Past Medical, Surgical, Social History, Allergies, and Medications have been Reviewed.  Past Medical History:  Diagnosis Date  . COPD (chronic obstructive pulmonary disease) (HCC)   .  Headache(784.0)   . Shortness of breath    Past Surgical History:  Procedure Laterality Date  . BACK SURGERY       Current Meds  Medication Sig  . albuterol (PROVENTIL HFA;VENTOLIN HFA) 108 (90 Base) MCG/ACT inhaler INHALE 1-2 PUFFS INTO THE LUNGS EVERY 6 (SIX) HOURS AS NEEDED FOR WHEEZING OR SHORTNESS OF BREATH.  Marland Kitchen albuterol (PROVENTIL) (2.5 MG/3ML) 0.083% nebulizer solution inhale contents of 1 vial in nebulizer every 4 hours if needed for wheezing  . amoxicillin-clavulanate (AUGMENTIN) 875-125 MG tablet Take 1 tablet by mouth 2 (two) times daily.  . Budeson-Glycopyrrol-Formoterol (BREZTRI AEROSPHERE) 160-9-4.8 MCG/ACT AERO Inhale 2 puffs into the lungs in the morning and at bedtime.  Marland Kitchen buPROPion (WELLBUTRIN SR) 150 MG 12 hr tablet Take 1 tablet (150 mg total) by mouth 2 (two) times daily.  . fluticasone (FLONASE) 50 MCG/ACT nasal spray Place 2 sprays into both nostrils daily.  . Olopatadine HCl 0.2 % SOLN Apply 1 drop to eye daily.  . traZODone (DESYREL) 150 MG tablet TAKE 1 TABLET BY MOUTH EVERY EVENING     Allergies:   Patient has no known allergies.   ROS:   Please see the history of present illness.     All other systems reviewed and are negative.   Labs/Other Tests and Data Reviewed:    Recent Labs: No results found for requested labs within last 8760 hours.   Recent Lipid Panel Lab Results  Component Value Date/Time   CHOL 152 06/18/2013 08:44 AM   TRIG 89 06/18/2013 08:44 AM   HDL 55 06/18/2013 08:44 AM  CHOLHDL 2.8 06/18/2013 08:44 AM   LDLCALC 79 06/18/2013 08:44 AM    Wt Readings from Last 3 Encounters:  02/02/21 133 lb (60.3 kg)  01/18/21 137 lb (62.1 kg)  01/12/21 140 lb (63.5 kg)     ASSESSMENT & PLAN:    Acute sinusitis Persistent symptoms for 2 weeks Started Augmentin Mucinex PRN for cough Flonase for allergies  Allergic conjunctivitis B/l eye redness and irritation, likely allergic Pataday eye drop prescribed   Time:   Today, I have  spent 13 minutes reviewing the chart, including problem list, medications, and with the patient with telehealth technology discussing the above problems.   Medication Adjustments/Labs and Tests Ordered: Current medicines are reviewed at length with the patient today.  Concerns regarding medicines are outlined above.   Tests Ordered: No orders of the defined types were placed in this encounter.   Medication Changes: Meds ordered this encounter  Medications  . amoxicillin-clavulanate (AUGMENTIN) 875-125 MG tablet    Sig: Take 1 tablet by mouth 2 (two) times daily.    Dispense:  20 tablet    Refill:  0  . fluticasone (FLONASE) 50 MCG/ACT nasal spray    Sig: Place 2 sprays into both nostrils daily.    Dispense:  16 g    Refill:  6  . Olopatadine HCl 0.2 % SOLN    Sig: Apply 1 drop to eye daily.    Dispense:  2.5 mL    Refill:  0     Note: This dictation was prepared with Dragon dictation along with smaller phrase technology. Similar sounding words can be transcribed inadequately or may not be corrected upon review. Any transcriptional errors that result from this process are unintentional.      Disposition:  Follow up  Signed, Anabel Halon, MD  02/02/2021 2:35 PM     [facility]

## 2021-02-14 ENCOUNTER — Ambulatory Visit (HOSPITAL_COMMUNITY): Payer: Medicare Other

## 2021-02-18 ENCOUNTER — Other Ambulatory Visit: Payer: Self-pay | Admitting: Internal Medicine

## 2021-02-18 DIAGNOSIS — F419 Anxiety disorder, unspecified: Secondary | ICD-10-CM

## 2021-02-23 ENCOUNTER — Ambulatory Visit: Payer: Self-pay | Admitting: Emergency Medicine

## 2021-03-04 ENCOUNTER — Ambulatory Visit (HOSPITAL_COMMUNITY): Payer: Medicare Other | Attending: Emergency Medicine

## 2021-03-22 ENCOUNTER — Ambulatory Visit: Payer: Self-pay | Admitting: Emergency Medicine

## 2021-03-24 ENCOUNTER — Ambulatory Visit (HOSPITAL_COMMUNITY)
Admission: RE | Admit: 2021-03-24 | Discharge: 2021-03-24 | Disposition: A | Discharge status: Home / Self Care | Payer: Medicare Other | Source: Ambulatory Visit | Attending: Emergency Medicine | Admitting: Emergency Medicine

## 2021-03-24 ENCOUNTER — Other Ambulatory Visit: Payer: Self-pay

## 2021-03-24 DIAGNOSIS — R911 Solitary pulmonary nodule: Secondary | ICD-10-CM

## 2021-03-30 ENCOUNTER — Telehealth: Payer: Self-pay

## 2021-03-30 DIAGNOSIS — Z720 Tobacco use: Secondary | ICD-10-CM | POA: Diagnosis not present

## 2021-03-30 DIAGNOSIS — Z7951 Long term (current) use of inhaled steroids: Secondary | ICD-10-CM | POA: Diagnosis not present

## 2021-03-30 DIAGNOSIS — R9431 Abnormal electrocardiogram [ECG] [EKG]: Secondary | ICD-10-CM | POA: Diagnosis not present

## 2021-03-30 DIAGNOSIS — Z79899 Other long term (current) drug therapy: Secondary | ICD-10-CM | POA: Diagnosis not present

## 2021-03-30 DIAGNOSIS — Z9981 Dependence on supplemental oxygen: Secondary | ICD-10-CM | POA: Diagnosis not present

## 2021-03-30 DIAGNOSIS — J439 Emphysema, unspecified: Secondary | ICD-10-CM | POA: Diagnosis not present

## 2021-03-30 DIAGNOSIS — R0602 Shortness of breath: Secondary | ICD-10-CM | POA: Diagnosis not present

## 2021-03-30 DIAGNOSIS — Z20822 Contact with and (suspected) exposure to covid-19: Secondary | ICD-10-CM | POA: Diagnosis not present

## 2021-03-30 NOTE — Telephone Encounter (Signed)
Patient called need med refill prednisone 20 mg to [facility]

## 2021-03-30 NOTE — Telephone Encounter (Signed)
Jeffrey Krueger - do you know if this is something he gets refilled regularly?

## 2021-03-31 ENCOUNTER — Other Ambulatory Visit: Payer: Self-pay | Admitting: Internal Medicine

## 2021-04-01 ENCOUNTER — Other Ambulatory Visit: Payer: Self-pay

## 2021-04-01 DIAGNOSIS — J449 Chronic obstructive pulmonary disease, unspecified: Secondary | ICD-10-CM

## 2021-04-01 MED ORDER — ALBUTEROL SULFATE (2.5 MG/3ML) 0.083% IN NEBU: INHALATION_SOLUTION | RESPIRATORY_TRACT | 3 refills | Status: DC

## 2021-04-07 ENCOUNTER — Telehealth: Payer: Self-pay | Admitting: Acute Care

## 2021-04-07 DIAGNOSIS — R911 Solitary pulmonary nodule: Secondary | ICD-10-CM

## 2021-04-07 NOTE — Telephone Encounter (Signed)
Please call patient and let him know I reviewed his Super D Ct with Dr. Delton Coombes. There has been some improvement , but he will need a 6 month Super D Ct to follow up on the remaining thickening in the left lower lobe to ensure there is continued improvement over the next 6 months.  Please place order for Super D in 6 months ( 09/2021), under my name, and let PCP know. Thanks so much.

## 2021-04-11 NOTE — Telephone Encounter (Signed)
Spoke with pt and advised of CT results per Kandice Robinsons, NP.

## 2021-04-11 NOTE — Telephone Encounter (Signed)
Left message for pt to call back to review CT results.  CT faxed to PCP. Order has been placed for 6 mth f/u Super D Chest CT.

## 2021-04-13 ENCOUNTER — Ambulatory Visit: Payer: Self-pay | Admitting: Internal Medicine

## 2021-04-26 ENCOUNTER — Ambulatory Visit (INDEPENDENT_AMBULATORY_CARE_PROVIDER_SITE_OTHER): Payer: Medicare Other | Admitting: Emergency Medicine

## 2021-04-26 ENCOUNTER — Other Ambulatory Visit: Payer: Self-pay

## 2021-04-26 ENCOUNTER — Encounter: Payer: Self-pay | Admitting: Emergency Medicine

## 2021-04-26 DIAGNOSIS — J449 Chronic obstructive pulmonary disease, unspecified: Secondary | ICD-10-CM

## 2021-04-26 DIAGNOSIS — J9611 Chronic respiratory failure with hypoxia: Secondary | ICD-10-CM | POA: Diagnosis not present

## 2021-04-26 DIAGNOSIS — Z720 Tobacco use: Secondary | ICD-10-CM | POA: Diagnosis not present

## 2021-04-26 DIAGNOSIS — R911 Solitary pulmonary nodule: Secondary | ICD-10-CM

## 2021-04-26 NOTE — Patient Instructions (Addendum)
We will continue Breztri.  Please increase to 2 puffs twice a day.  Rinse and gargle after using. Keep albuterol available to use either 1 nebulizer treatment or 2 puffs up to every 4 hours if needed for shortness of breath, chest tightness, wheezing.  You need to wear your oxygen at 2 L/min whenever you are up exerting yourself.  You will benefit from the COVID-19 vaccine.  We should consider getting this in the Fall Work hard to decrease your cigarettes.  Ultimate goal will be to stop altogether. We will repeat your CT scan of the chest in December 2022 to follow your pulmonary nodules Follow with Dr Delton Coombes in December or sooner if you have any problems.

## 2021-04-26 NOTE — Assessment & Plan Note (Signed)
Discussed and recommended cessation.  He is smoking over half pack a day, asked him to slowly start cutting down

## 2021-04-26 NOTE — Assessment & Plan Note (Signed)
Right upper lobe infiltrate has largely resolved and there is a residual nodule present, question scar.  He needs a repeat CT in 6 months to follow for stability.  We can do this at [facility]

## 2021-04-26 NOTE — Assessment & Plan Note (Signed)
Needs to use his oxygen reliably with exertion, 2 L/min.  He has not been titrated to pulsed flow.  He is interested in POC.

## 2021-04-26 NOTE — Progress Notes (Signed)
Subjective:    Patient ID: Jeffrey Krueger, male    DOB: June 30, 1956, 65 y.o.   MRN: [id number]  HPI 65 year old smoker (45 pack years) with a history of COPD, chronic hypoxemic respiratory failure on 2-3 l/min.  Review of notes indicates that he has been prescribed Trelegy but cannot afford, more recently Antonito but has not obtained.  He was recently admitted for acute hypoxemic respiratory failure in the setting of an acute exacerbation of COPD.  Required BiPAP during that hospitalization.    He has baseline severe resting and exertional SOB. Frequent cough and wheeze. Very limited functional capacity  He participates in lung cancer screening program and had a CT chest 11/26/2020 that I have reviewed.  This shows stable previously noted lung nodules largest 6.1 mm, a new irregular area of airspace density posterior right upper lobe with air bronchograms.  He was treated with levofloxacin for possible pneumonia.  CXR 01/03/2021 at [facility] reviewed by me shows apparent resolution of his right upper lobe infiltrate that was noted on the CT chest 11/26/2020  ROV 04/26/21 --follow-up visit 65 year old man with history of tobacco use (46 pack years) and associated COPD, chronic hypoxic respiratory failure on oxygen at 2 to 3 L/min.  Unfortunate looks like he had COVID-19 in May, was in the ED with an acute exacerbation in early July.  We have been managing him on Breztri, on 1 puff bid. He is using albuterol frequently through the day - usually for SOB or wheeze, both HFA and nebs. He uses O2 but not reliably, exerts without it. He would like an [employer] POC.   CT chest 03/24/2021 reviewed by me, shows large-scale clearance of the right upper lobe infiltrate seen on his CT chest 11/26/2020, there is a residual 12 mm nodule left in that area, question scar.  Some branching nodular thickening left lower lobe is unchanged compared with prior.  He has extensive emphysema.    Review of Systems As per HPI      Objective:   Physical Exam Vitals:   04/26/21 1208  BP: 128/82  Pulse: 87  Temp: 98 F (36.7 C)  TempSrc: Oral  SpO2: 93%  Weight: 139 lb 3.2 oz (63.1 kg)  Height: 5\' 9"  (1.753 m)   Gen: Pleasant, cachectic, in no distress,  normal affect  ENT: No lesions,  mouth clear,  oropharynx clear, no postnasal drip  Neck: No JVD, no stridor  Lungs: Mild accessory muscle use, no crackles. He does have B wheezes on normal breath  Cardiovascular: RRR, heart sounds normal, no murmur or gallops, no peripheral edema  Musculoskeletal: No deformities, no cyanosis or clubbing  Neuro: alert, awake, non focal  Skin: Warm, no lesions or rash      Assessment & Plan:  Tobacco abuse Discussed and recommended cessation.  He is smoking over half pack a day, asked him to slowly start cutting down  Lung nodule Right upper lobe infiltrate has largely resolved and there is a residual nodule present, question scar.  He needs a repeat CT in 6 months to follow for stability.  We can do this at [facility]  COPD (chronic obstructive pulmonary disease) (HCC) He has been under using his Breztri, only 1 puff twice a day.  We we will increase it to 2 puffs twice a day today.  He uses albuterol very frequently.  Discussed the inappropriate timing and dosing.  We will continue Breztri.  Please increase to 2 puffs twice a day.  Rinse and gargle after using. Keep albuterol available to use either 1 nebulizer treatment or 2 puffs up to every 4 hours if needed for shortness of breath, chest tightness, wheezing.  You will benefit from the COVID-19 vaccine.  We should consider getting this in the Fall Follow with Dr Delton Coombes in December or sooner if you have any problems.  Chronic respiratory failure with hypoxia (HCC) Needs to use his oxygen reliably with exertion, 2 L/min.  He has not been titrated to pulsed flow.  He is interested in POC.  Time spent 40 minutes  Levy Pupa, MD, PhD 04/26/2021, 12:36  PM [facility] [phone] or if no answer before 7:00PM call [phone] For any issues after 7:00PM please call eLink [phone]

## 2021-04-26 NOTE — Assessment & Plan Note (Signed)
He has been under using his Breztri, only 1 puff twice a day.  We we will increase it to 2 puffs twice a day today.  He uses albuterol very frequently.  Discussed the inappropriate timing and dosing.  We will continue Breztri.  Please increase to 2 puffs twice a day.  Rinse and gargle after using. Keep albuterol available to use either 1 nebulizer treatment or 2 puffs up to every 4 hours if needed for shortness of breath, chest tightness, wheezing.  You will benefit from the COVID-19 vaccine.  We should consider getting this in the Fall Follow with Dr Delton Coombes in December or sooner if you have any problems.

## 2021-04-26 NOTE — Addendum Note (Signed)
Addended by: Dorisann Frames R on: 04/26/2021 01:25 PM   Modules accepted: Orders

## 2021-05-09 ENCOUNTER — Telehealth: Payer: Self-pay | Admitting: Emergency Medicine

## 2021-05-09 NOTE — Telephone Encounter (Signed)
Jeffrey Krueger stated that they received an order for the pt to get a POC and she stated that the pt has oxygen from another provider; [employer]; she stated that he would have to get it through his current provider [employer] because insurance will not cover it through another provider such as them.   Pls regard; [phone]

## 2021-05-09 NOTE — Telephone Encounter (Addendum)
Order faxed to [facility].  Nothing further needed.

## 2021-05-09 NOTE — Telephone Encounter (Signed)
Call made to patient, confirmed DOB. Confirmed that he does have oxygen from [facility]. Made aware new order will have to be sent to [employer]. Voiced understanding.   Will route message to pcc team to make aware. Snapshot note has been updated.

## 2021-05-11 DIAGNOSIS — E440 Moderate protein-calorie malnutrition: Secondary | ICD-10-CM | POA: Insufficient documentation

## 2021-05-25 ENCOUNTER — Telehealth: Payer: Self-pay | Admitting: Emergency Medicine

## 2021-05-25 ENCOUNTER — Other Ambulatory Visit: Payer: Self-pay | Admitting: Internal Medicine

## 2021-05-25 DIAGNOSIS — F419 Anxiety disorder, unspecified: Secondary | ICD-10-CM

## 2021-05-25 MED ORDER — TRAZODONE HCL 150 MG PO TABS: ORAL_TABLET | ORAL | 0 refills | Status: DC

## 2021-05-25 NOTE — Telephone Encounter (Signed)
Per his chart, order was sent to [facility]. I called [employer] but no one answered. I left a detailed message for them to give our office a call.

## 2021-05-27 NOTE — Telephone Encounter (Signed)
Called [employer] and spoke with Simon Rhein  She states that they are just needing o2 qualification numbers  I faxed them to her at [phone] [facility] for Melissa to let her know

## 2021-06-06 DIAGNOSIS — F1994 Other psychoactive substance use, unspecified with psychoactive substance-induced mood disorder: Secondary | ICD-10-CM | POA: Insufficient documentation

## 2021-06-06 DIAGNOSIS — F1020 Alcohol dependence, uncomplicated: Secondary | ICD-10-CM | POA: Insufficient documentation

## 2021-06-07 NOTE — Telephone Encounter (Signed)
Spoke with Melissa and gave update on how we had faxed information to [employer]. Melissa stated understanding. Nothing further need at this time.

## 2021-06-22 ENCOUNTER — Other Ambulatory Visit: Payer: Self-pay | Admitting: Internal Medicine

## 2021-06-22 DIAGNOSIS — F419 Anxiety disorder, unspecified: Secondary | ICD-10-CM

## 2021-06-24 ENCOUNTER — Other Ambulatory Visit: Payer: Self-pay | Admitting: Internal Medicine

## 2021-06-24 DIAGNOSIS — J019 Acute sinusitis, unspecified: Secondary | ICD-10-CM

## 2021-07-08 ENCOUNTER — Other Ambulatory Visit: Payer: Self-pay | Admitting: Internal Medicine

## 2021-07-08 DIAGNOSIS — F419 Anxiety disorder, unspecified: Secondary | ICD-10-CM

## 2021-07-26 ENCOUNTER — Telehealth: Payer: Self-pay | Admitting: Emergency Medicine

## 2021-07-26 NOTE — Telephone Encounter (Signed)
Call made to [employer], they do not have an order for a POC. Confirmed fax number. Order refaxed.   Call made to patient daughter(emergency contact), confirmed DOB. Made aware order has been resent. Encouraged patient daughter to check with [employer] until this resolved. Voiced understanding.   Nothing further needed at this time.

## 2021-09-01 ENCOUNTER — Ambulatory Visit: Payer: Medicare Other | Admitting: Emergency Medicine

## 2021-09-12 ENCOUNTER — Other Ambulatory Visit: Payer: Self-pay | Admitting: Internal Medicine

## 2021-09-12 DIAGNOSIS — J449 Chronic obstructive pulmonary disease, unspecified: Secondary | ICD-10-CM

## 2021-09-29 ENCOUNTER — Other Ambulatory Visit: Payer: Self-pay

## 2021-09-29 ENCOUNTER — Ambulatory Visit (INDEPENDENT_AMBULATORY_CARE_PROVIDER_SITE_OTHER): Payer: Medicare Other | Admitting: Emergency Medicine

## 2021-09-29 ENCOUNTER — Encounter: Payer: Self-pay | Admitting: Emergency Medicine

## 2021-09-29 DIAGNOSIS — R911 Solitary pulmonary nodule: Secondary | ICD-10-CM

## 2021-09-29 DIAGNOSIS — J9611 Chronic respiratory failure with hypoxia: Secondary | ICD-10-CM | POA: Diagnosis not present

## 2021-09-29 DIAGNOSIS — Z720 Tobacco use: Secondary | ICD-10-CM

## 2021-09-29 DIAGNOSIS — J449 Chronic obstructive pulmonary disease, unspecified: Secondary | ICD-10-CM | POA: Diagnosis not present

## 2021-09-29 MED ORDER — PREDNISONE 20 MG PO TABS: 20.0000 mg | ORAL_TABLET | Freq: Every day | ORAL | 2 refills | Status: DC

## 2021-09-29 NOTE — Assessment & Plan Note (Signed)
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  He is not flaring but he does have wheezing, is extremely limited.  Going to start him on prednisone 20 mg daily.  See how he responds, possibly able to begin to wean down to lowest effective dose.  We will follow-up in 1 month to assess status.  I did broach the subject of CODE STATUS with him today.  I did explain that he would be very unlikely to survive mechanical ventilation or ACLS.  We're going to talk about it more next visit.   Please continue Breztri 2 puffs twice a day.  Rinse and gargle after using. Use your albuterol either 2 puffs or 1 nebulizer treatment up to every 4 hours if needed for shortness of breath, chest tightness, wheezing. We will start prednisone 20 mg once daily into your next visit.  Depending on how you respond we will discuss adjusting the dose Follow with APP in early February to review your status on the prednisone and to review your CT scan of the chest Follow Dr. Delton Coombes in 3 months

## 2021-09-29 NOTE — Patient Instructions (Addendum)
Please continue Breztri 2 puffs twice a day.  Rinse and gargle after using. Use your albuterol either 2 puffs or 1 nebulizer treatment up to every 4 hours if needed for shortness of breath, chest tightness, wheezing. We will start prednisone 20 mg once daily into your next visit.  Depending on how you respond we will discuss adjusting the dose Continue your oxygen at all times as you have been using it. Try to work on decreasing your cigarettes.  Any amount you can cut down will be helpful. Get your CT scan of the chest on 10/25/2021 as planned Follow with APP in early February to review your status on the prednisone and to review your CT scan of the chest Follow Dr. Delton Coombes in 3 months

## 2021-09-29 NOTE — Progress Notes (Signed)
Subjective:    Patient ID: Jeffrey Krueger, male    DOB: [birth year], 66 y.o.   MRN: [id number]  HPI  ROV 04/26/21 --follow-up visit 66 year old man with history of tobacco use (46 pack years) and associated COPD, chronic hypoxic respiratory failure on oxygen at 2 to 3 L/min.  Unfortunate looks like he had COVID-19 in May, was in the ED with an acute exacerbation in early July.  We have been managing him on Breztri, on 1 puff bid. He is using albuterol frequently through the day - usually for SOB or wheeze, both HFA and nebs. He uses O2 but not reliably, exerts without it. He would like an [employer] POC.   CT chest 03/24/2021 reviewed by me, shows large-scale clearance of the right upper lobe infiltrate seen on his CT chest 11/26/2020, there is a residual 12 mm nodule left in that area, question scar.  Some branching nodular thickening left lower lobe is unchanged compared with prior.  He has extensive emphysema.    ROV 09/29/21 --66 year old man with COPD, continued tobacco use, chronic hypoxemic respiratory failure.  He had a right upper lobe pneumonia followed on CT chest from March 22 and June 22.  There was a residual nodular scar on the most recent CT, plans to follow with serial imaging to ensure stability. He is very limited with regard to exertion, can barely walk through the house.   Uses albuterol about 4-5x a day, helps with mucous clearance and SOB. White/yellow mucous.  He was admitted in in mid August for an acute flare Currently managed on Breztri, uses albuterol Still smoking about 10-15/day.    Review of Systems As per HPI     Objective:   Physical Exam Vitals:   09/29/21 1643  BP: 130/70  Pulse: 79  Temp: 98 F (36.7 C)  TempSrc: Oral  SpO2: 100%  Weight: 147 lb (66.7 kg)  Height: 5\' 9"  (1.753 m)   Gen: Pleasant, cachectic, in no distress,  normal affect  ENT: No lesions,  mouth clear,  oropharynx clear, no postnasal drip  Neck: No JVD, no stridor  Lungs: Mild  accessory muscle use, no crackles. He does have B wheezes on normal breath  Cardiovascular: RRR, heart sounds normal, no murmur or gallops, no peripheral edema  Musculoskeletal: No deformities, no cyanosis or clubbing  Neuro: alert, awake, non focal  Skin: Warm, no lesions or rash      Assessment & Plan:  Tobacco abuse Discussed cessation with him today.  Encouraged him to continue to work on cutting down.  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  He is not flaring but he does have wheezing, is extremely limited.  Going to start him on prednisone 20 mg daily.  See how he responds, possibly able to begin to wean down to lowest effective dose.  We will follow-up in 1 month to assess status.  I did broach the subject of CODE STATUS with him today.  I did explain that he would be very unlikely to survive mechanical ventilation or ACLS.  We're going to talk about it more next visit.   Please continue Breztri 2 puffs twice a day.  Rinse and gargle after using. Use your albuterol either 2 puffs or 1 nebulizer treatment up to every 4 hours if needed for shortness of breath, chest tightness, wheezing. We will start prednisone 20 mg once  daily into your next visit.  Depending on how you respond we will discuss adjusting the dose Follow with APP in early February to review your status on the prednisone and to review your CT scan of the chest Follow Dr. Delton Coombes in 3 months  Lung nodule Small nodular area at the right apex, residual from prior infiltrate and probable pneumonia.  Plan to repeat his CT at the end of January to ensure no interval change.  Time spent 40 minutes   Levy Pupa, MD, PhD 09/29/2021, 5:16 PM [facility] [phone] or if no answer before 7:00PM call [phone] For any issues after 7:00PM please call eLink  [phone]

## 2021-09-29 NOTE — Assessment & Plan Note (Signed)
Continue oxygen as he has been using it 

## 2021-09-29 NOTE — Assessment & Plan Note (Signed)
Small nodular area at the right apex, residual from prior infiltrate and probable pneumonia.  Plan to repeat his CT at the end of January to ensure no interval change.

## 2021-09-29 NOTE — Assessment & Plan Note (Signed)
Discussed cessation with him today.  Encouraged him to continue to work on cutting down.

## 2021-10-18 ENCOUNTER — Other Ambulatory Visit: Payer: Self-pay | Admitting: Internal Medicine

## 2021-10-18 ENCOUNTER — Encounter: Payer: Self-pay | Admitting: Internal Medicine

## 2021-10-18 ENCOUNTER — Ambulatory Visit (INDEPENDENT_AMBULATORY_CARE_PROVIDER_SITE_OTHER): Payer: Medicare Other | Admitting: Internal Medicine

## 2021-10-18 ENCOUNTER — Other Ambulatory Visit: Payer: Self-pay

## 2021-10-18 VITALS — BP 128/72 | HR 76 | Resp 18 | Ht 69.0 in | Wt 151.1 lb

## 2021-10-18 DIAGNOSIS — J9611 Chronic respiratory failure with hypoxia: Secondary | ICD-10-CM | POA: Diagnosis not present

## 2021-10-18 DIAGNOSIS — Z720 Tobacco use: Secondary | ICD-10-CM | POA: Diagnosis not present

## 2021-10-18 DIAGNOSIS — K2950 Unspecified chronic gastritis without bleeding: Secondary | ICD-10-CM

## 2021-10-18 DIAGNOSIS — J449 Chronic obstructive pulmonary disease, unspecified: Secondary | ICD-10-CM

## 2021-10-18 DIAGNOSIS — F419 Anxiety disorder, unspecified: Secondary | ICD-10-CM

## 2021-10-18 MED ORDER — OMEPRAZOLE 40 MG PO CPDR: 40.0000 mg | DELAYED_RELEASE_CAPSULE | Freq: Every day | ORAL | 1 refills | Status: DC

## 2021-10-18 MED ORDER — BUPROPION HCL ER (SR) 200 MG PO TB12: 200.0000 mg | ORAL_TABLET | Freq: Two times a day (BID) | ORAL | 1 refills | Status: DC

## 2021-10-18 NOTE — Assessment & Plan Note (Signed)
Uncontrolled with Wellbutrin 150 mg twice daily, increased dose to 200 mg twice daily Continue trazodone 150 mg nightly for insomnia

## 2021-10-18 NOTE — Assessment & Plan Note (Signed)
Has been 2-3 l O2 at home since hospitalization for COPD exacerbation in 08/2020 Underlying COPD and continuous tobacco abuse

## 2021-10-18 NOTE — Assessment & Plan Note (Signed)
Started omeprazole for gastritis PPx as he is on chronic oral steroids for COPD

## 2021-10-18 NOTE — Assessment & Plan Note (Signed)
On Breztri and as needed albuterol Also uses albuterol neb On oral steroids now Followed by Pulmonology 

## 2021-10-18 NOTE — Progress Notes (Signed)
Established Patient Office Visit  Subjective:  Patient ID: Jeffrey Krueger, male    DOB: 23-Nov-1955  Age: 66 y.o. MRN: [id number]  CC:  Chief Complaint  Patient presents with   Follow-up    Follow up pt would like anxiety med increased he is still anxious also pt has disability form to be signed     HPI Jeffrey Krueger is a 66 y.o. male with past medical history ofCOPD, hypoxic respiratory failure, anxiety and tobacco abuse who presents for f/u of his chronic medical conditions.  He has been using Breztri and as needed albuterol for COPD.  He follows up with Pulmonology for COPD and chronic hypoxic respiratory failure. He has continuous home O2 for it now.  He is still smokes about 0.5 pack/day.  He complains of persistent anxiety despite taking Wellbutrin.  He denies any anhedonia.  He has was anxiety at nighttime when he has to use O2 while sleeping.  He takes trazodone for insomnia as well.  He denies any SI or HI currently.  He has brought his disability form, which has been filled and given to the patient.   Past Medical History:  Diagnosis Date   COPD (chronic obstructive pulmonary disease) (HCC)    Headache(784.0)    Shortness of breath     Past Surgical History:  Procedure Laterality Date   BACK SURGERY      History reviewed. No pertinent family history.  Social History   Socioeconomic History   Marital status: Married    Spouse name: Not on file   Number of children: Not on file   Years of education: Not on file   Highest education level: Not on file  Occupational History   Not on file  Tobacco Use   Smoking status: Every Day    Packs/day: 0.50    Years: 44.00    Pack years: 22.00    Types: Cigarettes    Start date: 02/06/1974   Smokeless tobacco: Never   Tobacco comments:    Pt currently smokes 10 cigarettes a day 04/26/21 [facility]J   Substance and Sexual Activity   Alcohol use: No    Alcohol/week: 0.0 standard drinks   Drug use: No   Sexual activity: Not on  file  Other Topics Concern   Not on file  Social History Narrative   Not on file   Social Determinants of Health   Financial Resource Strain: Not on file  Food Insecurity: Not on file  Transportation Needs: Not on file  Physical Activity: Not on file  Stress: Not on file  Social Connections: Not on file  Intimate Partner Violence: Not on file    Outpatient Medications Prior to Visit  Medication Sig Dispense Refill   albuterol (PROVENTIL HFA;VENTOLIN HFA) 108 (90 Base) MCG/ACT inhaler INHALE 1-2 PUFFS INTO THE LUNGS EVERY 6 (SIX) HOURS AS NEEDED FOR WHEEZING OR SHORTNESS OF BREATH. 25.5 Inhaler 1   albuterol (PROVENTIL) (2.5 MG/3ML) 0.083% nebulizer solution INHALE CONTENTS OF 1 VIAL IN NEBULIZER EVERY 4 HOURS IF NEEDED FOR WHEEZING 450 mL 3   Budeson-Glycopyrrol-Formoterol (BREZTRI AEROSPHERE) 160-9-4.8 MCG/ACT AERO Inhale 2 puffs into the lungs in the morning and at bedtime. 10.7 g 11   fluticasone (FLONASE) 50 MCG/ACT nasal spray USE 2 SPRAYS IN EACH NOSTRIL EVERY DAY 48 mL 0   Olopatadine HCl 0.2 % SOLN Apply 1 drop to eye daily. 2.5 mL 0   predniSONE (DELTASONE) 20 MG tablet Take 1 tablet (20 mg total) by mouth  daily with breakfast. 30 tablet 2   traZODone (DESYREL) 150 MG tablet TAKE 1 TABLET BY MOUTH EVERY DAY IN THE EVENING 90 tablet 0   buPROPion (WELLBUTRIN SR) 150 MG 12 hr tablet Take 1 tablet (150 mg total) by mouth 2 (two) times daily. 60 tablet 2   No facility-administered medications prior to visit.    No Known Allergies  ROS Review of Systems  Constitutional:  Positive for fatigue. Negative for chills and fever.  HENT:  Negative for congestion and sore throat.   Eyes:  Negative for pain and discharge.  Respiratory:  Positive for cough, shortness of breath and wheezing.   Cardiovascular:  Negative for chest pain and palpitations.  Gastrointestinal:  Negative for constipation, diarrhea, nausea and vomiting.  Endocrine: Negative for polydipsia and polyuria.   Genitourinary:  Negative for dysuria and hematuria.  Musculoskeletal:  Negative for neck pain and neck stiffness.  Skin:  Negative for rash.  Neurological:  Negative for dizziness, weakness, numbness and headaches.  Psychiatric/Behavioral:  Positive for sleep disturbance. Negative for agitation and behavioral problems. The patient is nervous/anxious.      Objective:    Physical Exam Vitals reviewed.  Constitutional:      General: He is not in acute distress.    Appearance: He is not diaphoretic.  HENT:     Head: Normocephalic and atraumatic.     Nose: Nose normal.     Mouth/Throat:     Mouth: Mucous membranes are moist.  Eyes:     General: No scleral icterus.    Extraocular Movements: Extraocular movements intact.  Cardiovascular:     Rate and Rhythm: Normal rate and regular rhythm.     Pulses: Normal pulses.     Heart sounds: Normal heart sounds. No murmur heard. Pulmonary:     Breath sounds: Wheezing (Mild) present. No rales.     Comments: O2 - 2 lpm Musculoskeletal:     Cervical back: Neck supple. No tenderness.     Right lower leg: No edema.     Left lower leg: No edema.  Skin:    General: Skin is warm.     Findings: No rash.  Neurological:     General: No focal deficit present.     Mental Status: He is alert and oriented to person, place, and time.     Sensory: No sensory deficit.     Motor: No weakness.  Psychiatric:        Mood and Affect: Mood normal.        Behavior: Behavior normal.    BP 128/72 (BP Location: Left Arm, Patient Position: Sitting, Cuff Size: Normal)    Pulse 76    Resp 18    Ht 5\' 9"  (1.753 m)    Wt 151 lb 0.8 oz (68.5 kg)    SpO2 100%    BMI 22.31 kg/m  Wt Readings from Last 3 Encounters:  10/18/21 151 lb 0.8 oz (68.5 kg)  09/29/21 147 lb (66.7 kg)  04/26/21 139 lb 3.2 oz (63.1 kg)    Lab Results  Component Value Date   TSH 0.986 11/29/2015   Lab Results  Component Value Date   WBC 23.3 (H) 07/07/2016   HGB 15.2 07/07/2016    HCT 44.2 07/07/2016   MCV 93.6 07/07/2016   PLT 216 07/07/2016   Lab Results  Component Value Date   NA 138 07/07/2016   K 3.9 07/07/2016   CO2 24 07/07/2016   GLUCOSE 183 (H) 07/07/2016  BUN 26 (H) 07/07/2016   CREATININE 1.13 07/07/2016   BILITOT 1.2 06/12/2013   ALKPHOS 86 06/12/2013   AST 18 06/12/2013   ALT 22 06/12/2013   PROT 6.8 06/12/2013   ALBUMIN 4.0 06/12/2013   CALCIUM 8.9 07/07/2016   ANIONGAP 10 07/07/2016   Lab Results  Component Value Date   CHOL 152 06/18/2013   Lab Results  Component Value Date   HDL 55 06/18/2013   Lab Results  Component Value Date   LDLCALC 79 06/18/2013   Lab Results  Component Value Date   TRIG 89 06/18/2013   Lab Results  Component Value Date   CHOLHDL 2.8 06/18/2013   No results found for: HGBA1C    Assessment & Plan:   Problem List Items Addressed This Visit       Respiratory   COPD (chronic obstructive pulmonary disease) ([place])    On Breztri and as needed albuterol Also uses albuterol neb On oral steroids now Followed by Pulmonology      Chronic respiratory failure with hypoxia ([place]) - Primary    Has been 2-3 l O2 at home since hospitalization for COPD exacerbation in 08/2020 Underlying COPD and continuous tobacco abuse        Digestive   Chronic gastritis without bleeding    Started omeprazole for gastritis PPx as he is on chronic oral steroids for COPD      Relevant Medications   omeprazole (PRILOSEC) 40 MG capsule     Other   Tobacco abuse    Smokes 0.5 pack/day currently  Asked about quitting: confirms that he currently smokes cigarettes Advise to quit smoking: Educated about QUITTING to reduce the risk of cancer, cardio and cerebrovascular disease. Assess willingness: Unwilling to quit at this time, but is working on cutting back. Assist with counseling and pharmacotherapy: Counseled for 5 minutes and literature provided. On Wellbutrin. Arrange for follow up: follow up in 3 months and  continue to offer help.  Advised for staying away from cigarettes while on home oxygen.      Anxiety    Uncontrolled with Wellbutrin 150 mg twice daily, increased dose to 200 mg twice daily Continue trazodone 150 mg nightly for insomnia      Relevant Medications   buPROPion (WELLBUTRIN SR) 200 MG 12 hr tablet    Meds ordered this encounter  Medications   omeprazole (PRILOSEC) 40 MG capsule    Sig: Take 1 capsule (40 mg total) by mouth daily.    Dispense:  90 capsule    Refill:  1   buPROPion (WELLBUTRIN SR) 200 MG 12 hr tablet    Sig: Take 1 tablet (200 mg total) by mouth 2 (two) times daily.    Dispense:  180 tablet    Refill:  1    Dose change    Follow-up: Return in about 6 months (around 04/17/2022) for Annual physical.    Lindell Spar, MD

## 2021-10-18 NOTE — Patient Instructions (Signed)
Please start taking Wellbutrin 200 mg twice daily.  Please start taking Omeprazole as prescribed for gastritis.  Please continue to take other medications as prescribed.

## 2021-10-18 NOTE — Assessment & Plan Note (Signed)
Smokes 0.5 pack/day currently  Asked about quitting: confirms that he currently smokes cigarettes Advise to quit smoking: Educated about QUITTING to reduce the risk of cancer, cardio and cerebrovascular disease. Assess willingness: Unwilling to quit at this time, but is working on cutting back. Assist with counseling and pharmacotherapy: Counseled for 5 minutes and literature provided. On Wellbutrin. Arrange for follow up: follow up in 3 months and continue to offer help.  Advised for staying away from cigarettes while on home oxygen. 

## 2021-10-25 ENCOUNTER — Ambulatory Visit (HOSPITAL_COMMUNITY)
Admission: RE | Admit: 2021-10-25 | Discharge: 2021-10-25 | Disposition: A | Discharge status: Home / Self Care | Payer: Medicare Other | Source: Ambulatory Visit | Attending: Acute Care | Admitting: Acute Care

## 2021-10-25 ENCOUNTER — Other Ambulatory Visit: Payer: Self-pay

## 2021-10-25 DIAGNOSIS — J432 Centrilobular emphysema: Secondary | ICD-10-CM | POA: Diagnosis not present

## 2021-10-25 DIAGNOSIS — R911 Solitary pulmonary nodule: Secondary | ICD-10-CM | POA: Diagnosis not present

## 2021-10-25 DIAGNOSIS — J439 Emphysema, unspecified: Secondary | ICD-10-CM | POA: Diagnosis not present

## 2021-10-25 DIAGNOSIS — I700 Atherosclerosis of aorta: Secondary | ICD-10-CM | POA: Insufficient documentation

## 2021-11-04 ENCOUNTER — Other Ambulatory Visit: Payer: Self-pay | Admitting: Internal Medicine

## 2021-11-04 ENCOUNTER — Encounter: Payer: Self-pay | Admitting: Acute Care

## 2021-11-04 ENCOUNTER — Ambulatory Visit (INDEPENDENT_AMBULATORY_CARE_PROVIDER_SITE_OTHER): Payer: Medicare Other | Admitting: Acute Care

## 2021-11-04 ENCOUNTER — Other Ambulatory Visit: Payer: Self-pay

## 2021-11-04 VITALS — BP 132/70 | HR 96 | Temp 98.0°F | Ht 69.0 in | Wt 144.6 lb

## 2021-11-04 DIAGNOSIS — J449 Chronic obstructive pulmonary disease, unspecified: Secondary | ICD-10-CM | POA: Diagnosis not present

## 2021-11-04 DIAGNOSIS — R911 Solitary pulmonary nodule: Secondary | ICD-10-CM | POA: Diagnosis not present

## 2021-11-04 DIAGNOSIS — J440 Chronic obstructive pulmonary disease with acute lower respiratory infection: Secondary | ICD-10-CM | POA: Diagnosis not present

## 2021-11-04 DIAGNOSIS — J209 Acute bronchitis, unspecified: Secondary | ICD-10-CM | POA: Diagnosis not present

## 2021-11-04 MED ORDER — PREDNISONE 5 MG PO TABS: 15.0000 mg | ORAL_TABLET | Freq: Every day | ORAL | 0 refills | Status: DC

## 2021-11-04 NOTE — Progress Notes (Signed)
History of Present Illness Jeffrey Krueger is a 66 y.o. male current every day smoker with end stage COPD, and chronic hypoxic respiratory failure. Hx of right upper lobe pneumonia 3/22, and 6/22. Most recent CT show scarring.Followed by Dr. Lamonte Sakai  who is following with serial CT scans.  Maintenance Breztri Rescue Albuterol which he uses 4-5 times a day. ( Inhaler and nebs) Still smoking 10-15 cigarettes a day  11/04/2021 Pt. Present for follow up of his end stage COPD. Dr. Lamonte Sakai saw the patient 09/29/2021, and started him on prednisone 20 mg daily to see if he received any benefit from this. Goal is for lowest effective dose. He has been started on prilosec to protect his stomach per his PCP. He states he  has been doing well on the 20 mg daily of prednisone. It has helped, as he states prednisone always makes his breathing better. We discussed titration down to 15 mg daily.He is in agreement with this. We discussed that we will slowly titrate down  to the lowest effective dose. He does continue to smoke. He wears his oxygen at 2-3 liters . He is very dyspneic at baseline. He does monitor his oxygen saturations, and maintains them at greater than 88%.   Most recent CT Chest shows Small spiculated nodule at the RIGHT lung apex is stable. When compared to the exam of November 25, 2020 this is markedly improved , the solid component measuring up to 20 mm in greatest axial dimension. Given this improvement would consider the more likely possibility of post infectious scarring. Consider 6-12 month follow-up to document continued stability. We will order a 6 month follow up scan.   Dr. Lamonte Sakai started goals of care discussion when he saw him 09/29/2021. We continued those discussions today. Pt. States he  has been intubated once in the past in 2019, and does not ever want to be on a ventilator again. He understands he most likely would never come off the vent. He  would want all other ACLS measures , including BiPAP.   Pt. States he would not want to be intubated, but would want all other measures including BiPAP. I told him I will change his Code status to partial code, everything but intubation. HIs daughter who is here with him today  supports him in this decision. He states he and his wife are also in agreement. Code Status has been changed to a partial Code in [place]. Everything with exception of intubation.  Test Results: 10/25/2021: Super D CT Chest WO Contrast Small spiculated nodule at the RIGHT lung apex is stable as measured by this observer on the prior study when measured in a similar fashion. When compared to the exam of November 25, 2020 this is markedly improved the solid component measuring up to 20 mm in greatest axial dimension. Given this improvement would consider the more likely possibility of post infectious scarring. Consider 6-12 month follow-up to document continued stability and/or regression. 2. Marked pulmonary emphysema with centrilobular predominance worse at the lung apices. 3. Emphysema and aortic atherosclerosis.  CBC Latest Ref Rng & Units 07/07/2016 09/20/2013 06/12/2013  WBC 4.0 - 10.5 K/uL 23.3(H) 8.6 13.3(H)  Hemoglobin 13.0 - 17.0 g/dL 15.2 14.9 16.0  Hematocrit 39.0 - 52.0 % 44.2 44.3 46.2  Platelets 150 - 400 K/uL 216 209 256    BMP Latest Ref Rng & Units 07/07/2016 11/29/2015 02/03/2015  Glucose 65 - 99 mg/dL 183(H) 79 79  BUN 6 - 20 mg/dL 26(H) 14  16  Creatinine 0.61 - 1.24 mg/dL 1.13 0.99 1.05  BUN/Creat Ratio 9 - 20 - 14 15  Sodium 135 - 145 mmol/L 138 144 143  Potassium 3.5 - 5.1 mmol/L 3.9 3.7 4.0  Chloride 101 - 111 mmol/L 104 100 101  CO2 22 - 32 mmol/L 24 26 25   Calcium 8.9 - 10.3 mg/dL 8.9 9.2 9.3    BNP    Component Value Date/Time   BNP 39.8 07/08/2016 0055    ProBNP No results found for: PROBNP  PFT No results found for: FEV1PRE, FEV1POST, FVCPRE, FVCPOST, TLC, DLCOUNC, PREFEV1FVCRT, PSTFEV1FVCRT  CT Super D Chest Wo Contrast  Result Date:  10/26/2021 CLINICAL DATA:  Male age 38 presents for evaluation none of a pulmonary nodule. EXAM: CT CHEST WITHOUT CONTRAST TECHNIQUE: Multidetector CT imaging of the chest was performed using thin slice collimation for electromagnetic bronchoscopy planning purposes, without intravenous contrast. RADIATION DOSE REDUCTION: This exam was performed according to the departmental dose-optimization program which includes automated exposure control, adjustment of the mA and/or kV according to patient size and/or use of iterative reconstruction technique. COMPARISON:  March 24, 2021. FINDINGS: Cardiovascular: Calcified atheromatous plaque in the thoracic aorta is mild. No aneurysmal dilation. Normal heart size. Normal caliber of central pulmonary vessels. Limited assessment of cardiovascular structures given lack of intravenous contrast. Mediastinum/Nodes: No thoracic inlet, axillary, mediastinal or hilar adenopathy. Esophagus grossly normal. Lungs/Pleura: Marked pulmonary emphysema with centrilobular predominance worse at the lung apices. Small spiculated nodule at the RIGHT lung apex (image 24/5) this measures 13 x 6 mm and is stable as measured by this observer on the prior study when measured in a similar fashion. Spiculation extending towards the pleural surface is unchanged as well. When compared to the exam of November 25, 2020 this is markedly improved the solid component measuring up to 20 mm in greatest axial dimension. Stable small nodule in the LEFT lower lobe measures approximately 6 mm. This is stable since 2016 and is compatible with a benign nodule. No new pulmonary nodule. Airways are patent. Upper Abdomen: Incidental imaging of upper abdominal contents without acute finding. Musculoskeletal: Spinal degenerative changes. No acute or destructive bone process. IMPRESSION: 1. Small spiculated nodule at the RIGHT lung apex is stable as measured by this observer on the prior study when measured in a similar fashion.  When compared to the exam of November 25, 2020 this is markedly improved the solid component measuring up to 20 mm in greatest axial dimension. Given this improvement would consider the more likely possibility of post infectious scarring. Consider 6-12 month follow-up to document continued stability and/or regression. 2. Marked pulmonary emphysema with centrilobular predominance worse at the lung apices. 3. Emphysema and aortic atherosclerosis. Aortic Atherosclerosis (ICD10-I70.0) and Emphysema (ICD10-J43.9). Electronically Signed   By: Zetta Bills M.D.   On: 10/26/2021 16:41     Past medical hx Past Medical History:  Diagnosis Date   COPD (chronic obstructive pulmonary disease) (HCC)    Headache(784.0)    Shortness of breath      Social History   Tobacco Use   Smoking status: Every Day    Packs/day: 0.50    Years: 44.00    Pack years: 22.00    Types: Cigarettes    Start date: 02/06/1974   Smokeless tobacco: Never   Tobacco comments:    Pt currently smokes 10 cigarettes a day 04/26/21 ARJ   Substance Use Topics   Alcohol use: No    Alcohol/week: 0.0 standard drinks  Drug use: No    Mr.Read reports that he has been smoking cigarettes. He started smoking about 47 years ago. He has a 22.00 pack-year smoking history. He has never used smokeless tobacco. He reports that he does not drink alcohol and does not use drugs.  Tobacco Cessation: Current every day smoker with a 44 pack year smoking history. I have spent 4 minutes counseling patient on smoking cessation this visit. We have reviewed the risks of continued smoking on his current health situation. Patient verbalizes understanding that by  continuing to  smoking he risks all the  negative health consequences including worsening of COPD, risk of lung cancer , stroke and heart disease.Marland Kitchen     Past surgical hx, Family hx, Social hx all reviewed.  Current Outpatient Medications on File Prior to Visit  Medication Sig   albuterol  (PROVENTIL) (2.5 MG/3ML) 0.083% nebulizer solution INHALE CONTENTS OF 1 VIAL IN NEBULIZER EVERY 4 HOURS IF NEEDED FOR WHEEZING   Budeson-Glycopyrrol-Formoterol (BREZTRI AEROSPHERE) 160-9-4.8 MCG/ACT AERO Inhale 2 puffs into the lungs in the morning and at bedtime.   buPROPion (WELLBUTRIN SR) 200 MG 12 hr tablet Take 1 tablet (200 mg total) by mouth 2 (two) times daily.   fluticasone (FLONASE) 50 MCG/ACT nasal spray USE 2 SPRAYS IN EACH NOSTRIL EVERY DAY   Olopatadine HCl 0.2 % SOLN Apply 1 drop to eye daily.   omeprazole (PRILOSEC) 40 MG capsule Take 1 capsule (40 mg total) by mouth daily.   traZODone (DESYREL) 150 MG tablet TAKE 1 TABLET BY MOUTH EVERY DAY IN THE EVENING   No current facility-administered medications on file prior to visit.     No Known Allergies  Review Of Systems:  Constitutional:   No  weight loss, night sweats,  Fevers, chills, + fatigue, or  lassitude.  HEENT:   No headaches,  Difficulty swallowing,  Tooth/dental problems, or  Sore throat,                No sneezing, itching, ear ache, nasal congestion, post nasal drip,   CV:  No chest pain,  Orthopnea, PND, swelling in lower extremities, anasarca, dizziness, palpitations, syncope.   GI  No heartburn, indigestion, abdominal pain, nausea, vomiting, diarrhea, change in bowel habits, loss of appetite, bloody stools.   Resp: + shortness of breath with exertion or at rest.  No excess mucus, no productive cough,  No non-productive cough,  No coughing up of blood.  No change in color of mucus.  + wheezing.  No chest wall deformity  Skin: no rash or lesions.  GU: no dysuria, change in color of urine, no urgency or frequency.  No flank pain, no hematuria   MS:  No joint pain or swelling.  No decreased range of motion.  No back pain.  Psych:  No change in mood or affect. No depression or anxiety.  No memory loss.   Vital Signs BP 132/70 (BP Location: Right Arm, Cuff Size: Normal)    Pulse 96    Temp 98 F (36.7 C)  (Oral)    Ht 5\' 9"  (1.753 m)    Wt 144 lb 9.6 oz (65.6 kg)    SpO2 98%    BMI 21.35 kg/m    Physical Exam:  General- No distress,  A&Ox3, pleasant ENT: No sinus tenderness, TM clear, pale nasal mucosa, no oral exudate,no post nasal drip, no LAN Cardiac: S1, S2, regular rate and rhythm, no murmur Chest: + wheeze/ No rales/ + dullness; Mild  accessory  muscle use, no nasal flaring, no sternal retractions Abd.: Soft Non-tender, ND, BS +, thin , Body mass index is 21.35 kg/m.  Ext: No clubbing cyanosis, edema Neuro:  normal strength, MAE x 4, A&O x 3, appropriate Skin: No rashes, warm and dry, no lesions Psych: normal mood and behavior   Assessment/Plan End Stage COPD Tobacco Abuse Stable Pulmonary Nodule on CT imaging 09/2021 Plan Most recent CT shows this nodule is stable. We will do a 6 month follow up Super D CT Chest Continue Breztri 2 puffs twice daily Rinse mouth after use Use your albuterol either 2 puffs or 1 nebulizer treatment up to every 4 hours if needed for shortness of breath, chest tightness, wheezing. Wear oxygen at 2 L [place]  Saturation goals are > 88% at all times.  We will drop prednisone to 15 mg daily.  Take three 5 mg tablets daily with breakfast.  Follow up in 1 month with Dr. Lamonte Sakai or Judson Roch NP. To evaluate how you are doing on 15 mg prednisone.  We will call [facility]) to remind them you need the inogen delivered. ( Please have triage call)  Please quit smoking You need to quit completely.  Please contact office for sooner follow up if symptoms do not improve or worsen or seek emergency care    You can receive free nicotine replacement therapy ( patches, gum or mints) by calling 1-800-QUIT NOW. Please call so we can get you on the path to becoming  a non-smoker. I know it is hard, but you can do this!   Hypnosis for smoking cessation  [employer]. [phone]  Acupuncture for smoking cessation  [employer] [phone]   I spent 45 minutes dedicated to the care of this patient on the date of this encounter to include pre-visit review of records, face-to-face time with the patient discussing conditions above, post visit ordering of testing, clinical documentation with the electronic health record, making appropriate referrals as documented, and communicating necessary information to the patient's healthcare team.   Magdalen Spatz, NP 11/04/2021  4:02 PM

## 2021-11-04 NOTE — Progress Notes (Signed)
History of Present Illness Jeffrey Krueger is a 66 y.o. male current every day smoker with end stage COPD, and chronic hypoxic respiratory failure. Hx of right upper lobe pneumonia 3/22, and 6/22. Most recent CT show scarring. Maintenance Breztri Rescue Albuterol which he uses 4-5 times a day.  Still smoking 10-15 cigarettes a day  11/04/2021 Pt. Present for follow up of his end stage COPD. Dr. Lamonte Sakai saw the patient 09/29/2021, and started him on prednisone 20 mg daily to see if he received any benefit from this. Goal is for lowest effective dose. He has been started on prilosec to protect his stomach per his PCP. He states he  has been doing well on the 20 mg daily of prednisone. It has helped, as he states prednisone always makes his breathing better. We discussed titration down to 15 mg daily.He is in agreement with this. He does continue to smoke. He wears his oxygen at 2-3 liters . He is very dyspneic at baseline.   Dr. Lamonte Sakai started goals of care discussion when he saw him 09/29/2021. Pt. States he  has been intubated once in the past in 2019, and does not ever want to be on a ventilator again. He understands he most likely would never come off the vent. He  would want all other ACLS measures , including BiPAP.  Pt. States he would not want to be intubated, but would want all other measures including BiPAP.  Test Results: Super D CT Chest WO Contrast Small spiculated nodule at the RIGHT lung apex is stable as measured by this observer on the prior study when measured in a similar fashion. When compared to the exam of November 25, 2020 this is markedly improved the solid component measuring up to 20 mm in greatest axial dimension. Given this improvement would consider the more likely possibility of post infectious scarring. Consider 6-12 month follow-up to document continued stability and/or regression. 2. Marked pulmonary emphysema with centrilobular predominance worse at the lung apices. 3.  Emphysema and aortic atherosclerosis.  CBC Latest Ref Rng & Units 07/07/2016 09/20/2013 06/12/2013  WBC 4.0 - 10.5 K/uL 23.3(H) 8.6 13.3(H)  Hemoglobin 13.0 - 17.0 g/dL 15.2 14.9 16.0  Hematocrit 39.0 - 52.0 % 44.2 44.3 46.2  Platelets 150 - 400 K/uL 216 209 256    BMP Latest Ref Rng & Units 07/07/2016 11/29/2015 02/03/2015  Glucose 65 - 99 mg/dL 183(H) 79 79  BUN 6 - 20 mg/dL 26(H) 14 16  Creatinine 0.61 - 1.24 mg/dL 1.13 0.99 1.05  BUN/Creat Ratio 9 - 20 - 14 15  Sodium 135 - 145 mmol/L 138 144 143  Potassium 3.5 - 5.1 mmol/L 3.9 3.7 4.0  Chloride 101 - 111 mmol/L 104 100 101  CO2 22 - 32 mmol/L 24 26 25   Calcium 8.9 - 10.3 mg/dL 8.9 9.2 9.3    BNP    Component Value Date/Time   BNP 39.8 07/08/2016 0055    ProBNP No results found for: PROBNP  PFT No results found for: FEV1PRE, FEV1POST, FVCPRE, FVCPOST, TLC, DLCOUNC, PREFEV1FVCRT, PSTFEV1FVCRT  CT Super D Chest Wo Contrast  Result Date: 10/26/2021 CLINICAL DATA:  Male age 89 presents for evaluation none of a pulmonary nodule. EXAM: CT CHEST WITHOUT CONTRAST TECHNIQUE: Multidetector CT imaging of the chest was performed using thin slice collimation for electromagnetic bronchoscopy planning purposes, without intravenous contrast. RADIATION DOSE REDUCTION: This exam was performed according to the departmental dose-optimization program which includes automated exposure control, adjustment of the mA  and/or kV according to patient size and/or use of iterative reconstruction technique. COMPARISON:  March 24, 2021. FINDINGS: Cardiovascular: Calcified atheromatous plaque in the thoracic aorta is mild. No aneurysmal dilation. Normal heart size. Normal caliber of central pulmonary vessels. Limited assessment of cardiovascular structures given lack of intravenous contrast. Mediastinum/Nodes: No thoracic inlet, axillary, mediastinal or hilar adenopathy. Esophagus grossly normal. Lungs/Pleura: Marked pulmonary emphysema with centrilobular  predominance worse at the lung apices. Small spiculated nodule at the RIGHT lung apex (image 24/5) this measures 13 x 6 mm and is stable as measured by this observer on the prior study when measured in a similar fashion. Spiculation extending towards the pleural surface is unchanged as well. When compared to the exam of November 25, 2020 this is markedly improved the solid component measuring up to 20 mm in greatest axial dimension. Stable small nodule in the LEFT lower lobe measures approximately 6 mm. This is stable since 2016 and is compatible with a benign nodule. No new pulmonary nodule. Airways are patent. Upper Abdomen: Incidental imaging of upper abdominal contents without acute finding. Musculoskeletal: Spinal degenerative changes. No acute or destructive bone process. IMPRESSION: 1. Small spiculated nodule at the RIGHT lung apex is stable as measured by this observer on the prior study when measured in a similar fashion. When compared to the exam of November 25, 2020 this is markedly improved the solid component measuring up to 20 mm in greatest axial dimension. Given this improvement would consider the more likely possibility of post infectious scarring. Consider 6-12 month follow-up to document continued stability and/or regression. 2. Marked pulmonary emphysema with centrilobular predominance worse at the lung apices. 3. Emphysema and aortic atherosclerosis. Aortic Atherosclerosis (ICD10-I70.0) and Emphysema (ICD10-J43.9). Electronically Signed   By: Zetta Bills M.D.   On: 10/26/2021 16:41     Past medical hx Past Medical History:  Diagnosis Date   COPD (chronic obstructive pulmonary disease) (HCC)    Headache(784.0)    Shortness of breath      Social History   Tobacco Use   Smoking status: Every Day    Packs/day: 0.50    Years: 44.00    Pack years: 22.00    Types: Cigarettes    Start date: 02/06/1974   Smokeless tobacco: Never   Tobacco comments:    Pt currently smokes 10 cigarettes a  day 04/26/21 [facility]   Substance Use Topics   Alcohol use: No    Alcohol/week: 0.0 standard drinks   Drug use: No    Mr.Males reports that he has been smoking cigarettes. He started smoking about 47 years ago. He has a 22.00 pack-year smoking history. He has never used smokeless tobacco. He reports that he does not drink alcohol and does not use drugs.  Tobacco Cessation: Current every day smoker with a 44 pack year smoking history. I have spent 4 minutes counseling patient on smoking cessation this visit. We have reviewed the risks of continued smoking on his current health situation. Patient verbalizes understanding that by  continuing to  smoking he risks all the  negative health consequences including worsening of COPD, risk of lung cancer , stroke and heart disease.Marland Kitchen     Past surgical hx, Family hx, Social hx all reviewed.  Current Outpatient Medications on File Prior to Visit  Medication Sig   albuterol (PROVENTIL) (2.5 MG/3ML) 0.083% nebulizer solution INHALE CONTENTS OF 1 VIAL IN NEBULIZER EVERY 4 HOURS IF NEEDED FOR WHEEZING   Budeson-Glycopyrrol-Formoterol (BREZTRI AEROSPHERE) 160-9-4.8 MCG/ACT AERO Inhale 2 puffs  into the lungs in the morning and at bedtime.   buPROPion (WELLBUTRIN SR) 200 MG 12 hr tablet Take 1 tablet (200 mg total) by mouth 2 (two) times daily.   fluticasone (FLONASE) 50 MCG/ACT nasal spray USE 2 SPRAYS IN EACH NOSTRIL EVERY DAY   Olopatadine HCl 0.2 % SOLN Apply 1 drop to eye daily.   omeprazole (PRILOSEC) 40 MG capsule Take 1 capsule (40 mg total) by mouth daily.   traZODone (DESYREL) 150 MG tablet TAKE 1 TABLET BY MOUTH EVERY DAY IN THE EVENING   No current facility-administered medications on file prior to visit.     No Known Allergies  Review Of Systems:  Constitutional:   No  weight loss, night sweats,  Fevers, chills, fatigue, or  lassitude.  HEENT:   No headaches,  Difficulty swallowing,  Tooth/dental problems, or  Sore throat,                No  sneezing, itching, ear ache, nasal congestion, post nasal drip,   CV:  No chest pain,  Orthopnea, PND, swelling in lower extremities, anasarca, dizziness, palpitations, syncope.   GI  No heartburn, indigestion, abdominal pain, nausea, vomiting, diarrhea, change in bowel habits, loss of appetite, bloody stools.   Resp: No shortness of breath with exertion or at rest.  No excess mucus, no productive cough,  No non-productive cough,  No coughing up of blood.  No change in color of mucus.  No wheezing.  No chest wall deformity  Skin: no rash or lesions.  GU: no dysuria, change in color of urine, no urgency or frequency.  No flank pain, no hematuria   MS:  No joint pain or swelling.  No decreased range of motion.  No back pain.  Psych:  No change in mood or affect. No depression or anxiety.  No memory loss.   Vital Signs BP 132/70 (BP Location: Right Arm, Cuff Size: Normal)    Pulse 96    Temp 98 F (36.7 C) (Oral)    Ht 5\' 9"  (1.753 m)    Wt 144 lb 9.6 oz (65.6 kg)    SpO2 98%    BMI 21.35 kg/m    Physical Exam:  General- No distress,  A&Ox3, pleasant ENT: No sinus tenderness, TM clear, pale nasal mucosa, no oral exudate,no post nasal drip, no LAN Cardiac: S1, S2, regular rate and rhythm, no murmur Chest: + wheeze/ No rales/ + dullness; Mild  accessory muscle use, no nasal flaring, no sternal retractions Abd.: Soft Non-tender, ND, BS +, thin , Body mass index is 21.35 kg/m.  Ext: No clubbing cyanosis, edema Neuro:  normal strength, MAE x 4, A&O x 3, appropriate Skin: No rashes, warm and dry, no lesions Psych: normal mood and behavior   Assessment/Plan End Stage COPD Stable Lung Nodule/ scarring Plan Most recent CT shows this nodule is stable. Continue Breztri 2 puffs twice daily Rinse mouth after use Use your albuterol either 2 puffs or 1 nebulizer treatment up to every 4 hours if needed for shortness of breath, chest tightness, wheezing. Wear oxygen at 2 L [place]  Saturation  goals are > 88% at all times.  We will drop prednisone to 15 mg daily.  Take three 5 mg tablets daily with breakfast.  Follow up in 1 month with Dr. Lamonte Sakai or Judson Roch NP. To evaluate how you are doing on 15 mg prednisone.  We will call [facility]) to remind them you need the inogen delivered. (  Please have triage call)  Please quit smoking You need to quit completely.  Please contact office for sooner follow up if symptoms do not improve or worsen or seek emergency care    You can receive free nicotine replacement therapy ( patches, gum or mints) by calling 1-800-QUIT NOW. Please call so we can get you on the path to becoming  a non-smoker. I know it is hard, but you can do this!   Hypnosis for smoking cessation  [employer]. [phone]  Acupuncture for smoking cessation  [employer] [phone]   I spent 50 minutes dedicated to the care of this patient on the date of this encounter to include pre-visit review of records, face-to-face time with the patient discussing conditions above, post visit ordering of testing, clinical documentation with the electronic health record, making appropriate referrals as documented, and communicating necessary information to the patient's healthcare team.    Magdalen Spatz, NP 11/04/2021  4:02 PM

## 2021-11-04 NOTE — Patient Instructions (Addendum)
It is good to see you today. We will schedule a follow up Super D CT Chest in 6 months to continue following the nodule in the right lung apex.  Most recent CT shows this nodule is stable. Continue Breztri 2 puffs twice daily Rinse mouth after use Use your albuterol either 2 puffs or 1 nebulizer treatment up to every 4 hours if needed for shortness of breath, chest tightness, wheezing. Wear oxygen at 2 L [place]  Saturation goals are > 88% at all times.  We will drop prednisone to 15 mg daily.  Take three 5 mg tablets daily with breakfast.  Follow up in 1 month with Dr. Delton Coombes or Maralyn Sago NP. To evaluate how you are doing on 15 mg prednisone.  We will call [facility]) to remind them you need the inogen delivered. ( Please have triage call)  Please quit smoking You need to quit completely.  Please contact office for sooner follow up if symptoms do not improve or worsen or seek emergency care    You can receive free nicotine replacement therapy ( patches, gum or mints) by calling 1-800-QUIT NOW. Please call so we can get you on the path to becoming  a non-smoker. I know it is hard, but you can do this!   Hypnosis for smoking cessation  [employer] [phone]  Acupuncture for smoking cessation  [employer] [phone]

## 2021-11-17 ENCOUNTER — Other Ambulatory Visit: Payer: Self-pay | Admitting: Internal Medicine

## 2021-11-17 DIAGNOSIS — F419 Anxiety disorder, unspecified: Secondary | ICD-10-CM

## 2021-11-24 ENCOUNTER — Other Ambulatory Visit: Payer: Self-pay | Admitting: Acute Care

## 2021-11-24 DIAGNOSIS — J440 Chronic obstructive pulmonary disease with acute lower respiratory infection: Secondary | ICD-10-CM

## 2021-11-24 DIAGNOSIS — J209 Acute bronchitis, unspecified: Secondary | ICD-10-CM

## 2021-11-25 NOTE — Telephone Encounter (Signed)
Please advise patient requesting refill °

## 2021-12-02 ENCOUNTER — Encounter: Payer: Self-pay | Admitting: Emergency Medicine

## 2021-12-02 ENCOUNTER — Ambulatory Visit (INDEPENDENT_AMBULATORY_CARE_PROVIDER_SITE_OTHER): Payer: Medicare Other | Admitting: Emergency Medicine

## 2021-12-02 ENCOUNTER — Other Ambulatory Visit: Payer: Self-pay

## 2021-12-02 DIAGNOSIS — Z720 Tobacco use: Secondary | ICD-10-CM

## 2021-12-02 DIAGNOSIS — J449 Chronic obstructive pulmonary disease, unspecified: Secondary | ICD-10-CM | POA: Diagnosis not present

## 2021-12-02 DIAGNOSIS — J9611 Chronic respiratory failure with hypoxia: Secondary | ICD-10-CM | POA: Diagnosis not present

## 2021-12-02 DIAGNOSIS — R911 Solitary pulmonary nodule: Secondary | ICD-10-CM

## 2021-12-02 NOTE — Assessment & Plan Note (Signed)
Continue your oxygen at 3 L/min.  We will investigate getting you your Inogen device ?

## 2021-12-02 NOTE — Assessment & Plan Note (Signed)
Right upper lobe scar stable on serial CT scans of the chest.  Plan for 65-month follow-up in July. ?

## 2021-12-02 NOTE — Progress Notes (Signed)
? ?Subjective:  ? ? Patient ID: Jeffrey Krueger, male    DOB: 02-15-1956, 66 y.o.   MRN: [id number] ? ?HPI ? ?ROV 04/26/21 --follow-up visit 66 year old man with history of tobacco use (46 pack years) and associated COPD, chronic hypoxic respiratory failure on oxygen at 2 to 3 L/min.  Unfortunate looks like he had COVID-19 in May, was in the ED with an acute exacerbation in early July.  We have been managing him on Breztri, on 1 puff bid. He is using albuterol frequently through the day - usually for SOB or wheeze, both HFA and nebs. He uses O2 but not reliably, exerts without it. He would like an Inogen POC.  ? ?CT chest 03/24/2021 reviewed by me, shows large-scale clearance of the right upper lobe infiltrate seen on his CT chest 11/26/2020, there is a residual 12 mm nodule left in that area, question scar.  Some branching nodular thickening left lower lobe is unchanged compared with prior.  He has extensive emphysema.  ? ? ?ROV 09/29/21 --66 year old man with COPD, continued tobacco use, chronic hypoxemic respiratory failure.  He had a right upper lobe pneumonia followed on CT chest from March 22 and June 22.  There was a residual nodular scar on the most recent CT, plans to follow with serial imaging to ensure stability. ?He is very limited with regard to exertion, can barely walk through the house.   ?Uses albuterol about 4-5x a day, helps with mucous clearance and SOB. White/yellow mucous.  ?He was admitted in in mid August for an acute flare ?Currently managed on Breztri, uses albuterol ?Still smoking about 10-15/day.  ? ?ROV 12/02/21 --follow-up visit for 66 year old man with end-stage COPD, continued tobacco use, associated chronic hypoxemic respiratory failure on 2 to 3 L/min.  Currently managed on Breztri, uses albuterol approximately ?He had a right upper lobe pneumonia and we have been following resolution of residual nodular scar on serial CT scans of the chest.  I started him on prednisone 20 mg daily in January,  decreased and now on 15 mg daily.  At his most recent visit with [name] he confirmed that he would not want to be intubated if he had acute respiratory failure.  He would want all other aggressive interventions including ACLS or BiPAP.  Today he reports that he is feeling better since we started the steroids, less SOB. He is having some cough, less productive. Smoking 10 cig a day.  He is waiting for an Inogen portable oxygen concentrator, does not have this yet. ? ? ?Review of Systems ?As per HPI ? ?   ?Objective:  ? Physical Exam ?Vitals:  ? 12/02/21 1556  ?BP: 128/78  ?Pulse: 88  ?Temp: 98.4 ?F (36.9 ?C)  ?TempSrc: Oral  ?SpO2: 96%  ?Weight: 144 lb 3.2 oz (65.4 kg)  ?Height: 5\' 9"  (1.753 m)  ? ?Gen: Pleasant, cachectic, in no distress,  normal affect ? ?ENT: No lesions,  mouth clear,  oropharynx clear, no postnasal drip ? ?Neck: No JVD, no stridor ? ?Lungs: Mild accessory muscle use, no crackles. He does have B wheezes on normal breath ? ?Cardiovascular: RRR, heart sounds normal, no murmur or gallops, no peripheral edema ? ?Musculoskeletal: No deformities, no cyanosis or clubbing ? ?Neuro: alert, awake, non focal ? ?Skin: Warm, no lesions or rash ? ? ?   ?Assessment & Plan:  ?COPD (chronic obstructive pulmonary disease) (HCC) ?Please continue Breztri 2 puffs twice a day.  Rinse and gargle after using. ?Keep albuterol  available to use 2 puffs or 1 nebulizer treatment up to every 4 hours if needed for shortness of breath, chest tightness, wheezing. ?Continue prednisone 15 mg daily. ?Follow with Dr Delton Coombes in July or sooner if you have any problems ? ?Chronic respiratory failure with hypoxia (HCC) ?Continue your oxygen at 3 L/min.  We will investigate getting you your Inogen device ? ?Lung nodule ?Right upper lobe scar stable on serial CT scans of the chest.  Plan for 72-month follow-up in July. ? ?Tobacco abuse ?Discussed cessation with him today.  I recommended that he try to get down to less than 10 cigarettes  daily ? ? ? ? ?Levy Pupa, MD, PhD ?12/02/2021, 4:32 PM ?[facility] ?[phone] or if no answer before 7:00PM call [phone] ?For any issues after 7:00PM please call eLink [phone] ? ?

## 2021-12-02 NOTE — Assessment & Plan Note (Signed)
Discussed cessation with him today.  I recommended that he try to get down to less than 10 cigarettes daily ?

## 2021-12-02 NOTE — Patient Instructions (Addendum)
Please continue Breztri 2 puffs twice a day.  Rinse and gargle after using. ?Keep albuterol available to use 2 puffs or 1 nebulizer treatment up to every 4 hours if needed for shortness of breath, chest tightness, wheezing. ?Continue prednisone 15 mg daily. ?Continue your oxygen at 3 L/min.  We will investigate getting you your Inogen device ?You would benefit from decreasing your smoking.  Ultimate goal would be to stop altogether. ?We will plan to repeat your CT scan of the chest in July 2023 to follow right upper lobe scar ?Follow with Dr Lamonte Sakai in July or sooner if you have any problems ? ?

## 2021-12-02 NOTE — Assessment & Plan Note (Signed)
Please continue Breztri 2 puffs twice a day.  Rinse and gargle after using. ?Keep albuterol available to use 2 puffs or 1 nebulizer treatment up to every 4 hours if needed for shortness of breath, chest tightness, wheezing. ?Continue prednisone 15 mg daily. ?Follow with Dr Delton Coombes in July or sooner if you have any problems ?

## 2021-12-07 ENCOUNTER — Other Ambulatory Visit: Payer: Self-pay | Admitting: Emergency Medicine

## 2021-12-12 ENCOUNTER — Other Ambulatory Visit: Payer: Self-pay | Admitting: Internal Medicine

## 2021-12-12 DIAGNOSIS — J019 Acute sinusitis, unspecified: Secondary | ICD-10-CM

## 2022-01-09 ENCOUNTER — Other Ambulatory Visit: Payer: Self-pay | Admitting: Acute Care

## 2022-01-09 ENCOUNTER — Other Ambulatory Visit: Payer: Self-pay | Admitting: Emergency Medicine

## 2022-01-09 DIAGNOSIS — J209 Acute bronchitis, unspecified: Secondary | ICD-10-CM

## 2022-01-16 ENCOUNTER — Other Ambulatory Visit: Payer: Self-pay | Admitting: Internal Medicine

## 2022-01-28 ENCOUNTER — Other Ambulatory Visit: Payer: Self-pay | Admitting: Emergency Medicine

## 2022-02-01 ENCOUNTER — Other Ambulatory Visit: Payer: Self-pay | Admitting: Internal Medicine

## 2022-02-01 DIAGNOSIS — F419 Anxiety disorder, unspecified: Secondary | ICD-10-CM

## 2022-02-19 ENCOUNTER — Other Ambulatory Visit: Payer: Self-pay | Admitting: Internal Medicine

## 2022-02-19 ENCOUNTER — Other Ambulatory Visit: Payer: Self-pay | Admitting: Emergency Medicine

## 2022-02-19 DIAGNOSIS — J449 Chronic obstructive pulmonary disease, unspecified: Secondary | ICD-10-CM

## 2022-03-08 IMAGING — CT CT CHEST SUPER D W/O CM
2 of 4 series · 15 of 36 positions shown, 18 images · non-contrast
Comparison: March 24, 2021.

CLINICAL DATA: Male age 65 presents for evaluation none of a
pulmonary nodule.

EXAM:
CT CHEST WITHOUT CONTRAST
TECHNIQUE: Multidetector CT imaging of the chest was performed using thin slice
collimation for electromagnetic bronchoscopy planning purposes,
without intravenous contrast.
RADIATION DOSE REDUCTION: This exam was performed according to the
departmental dose-optimization program which includes automated
exposure control, adjustment of the mA and/or kV according to
patient size and/or use of iterative reconstruction technique.

[Series 2: coronal · coronal · 0.71mm/px · 3 of 150 slices shown]
[im 30/150  lung]
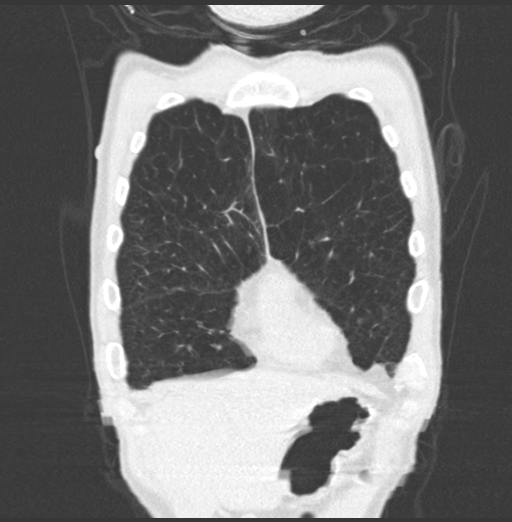
[im 60/150  lung]
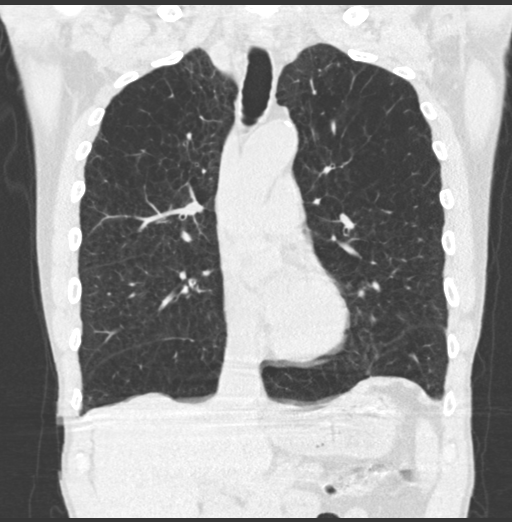
[im 90/150  lung]
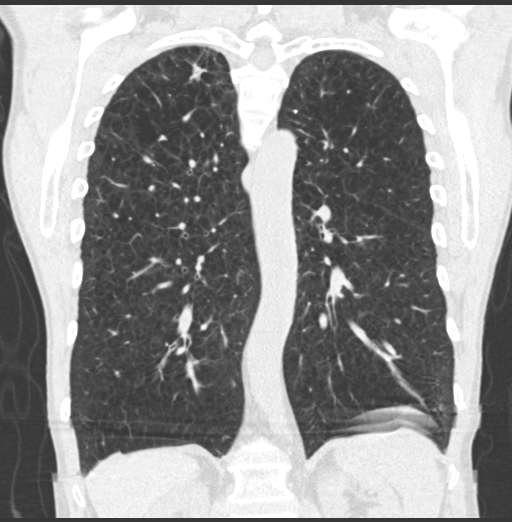

[Series 4: super d-thins · axial · 0.69mm/px · z∈[+1102,+1426]mm · 12 of 453 slices shown, 15 images]
[im 24/453  mediastinal]
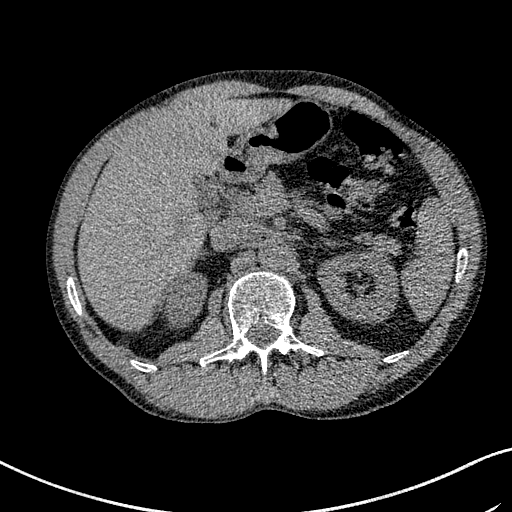
[im 24/453  lung]
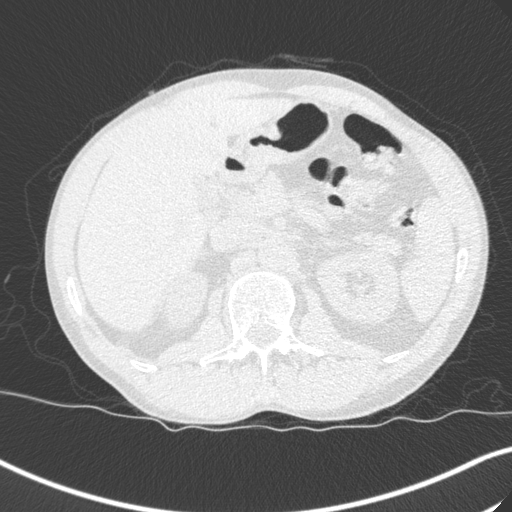
[im 72/453  lung]
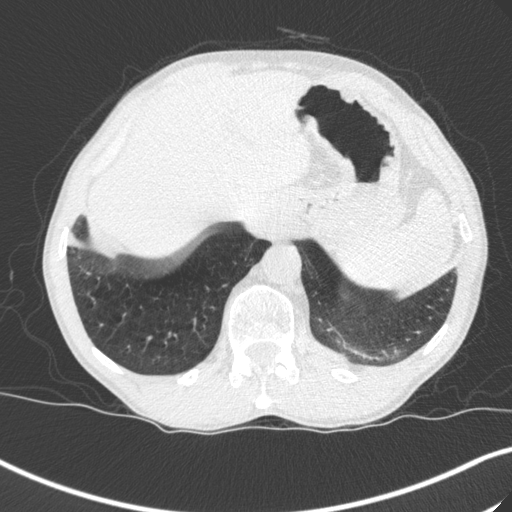
[im 96/453  lung]
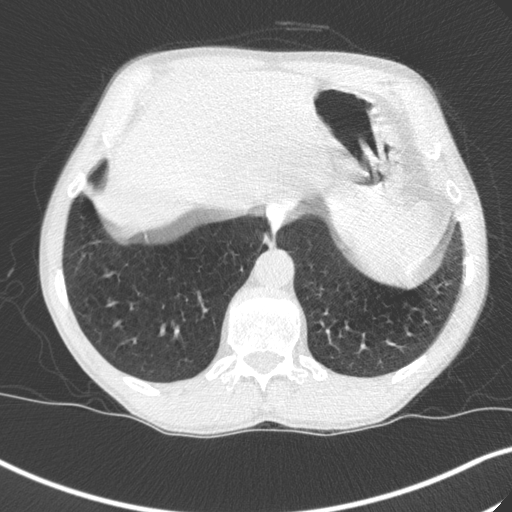
[im 143/453  lung]
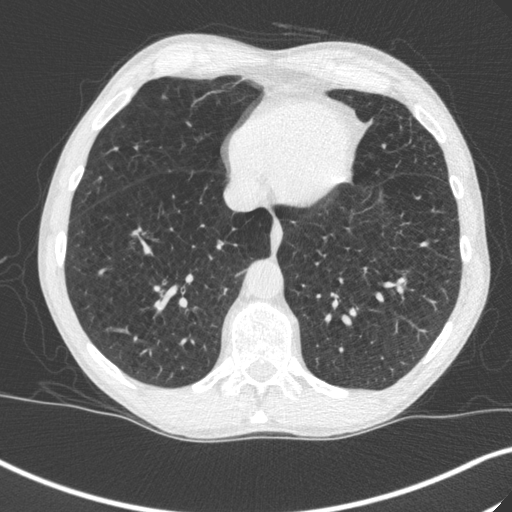
[im 167/453  mediastinal]
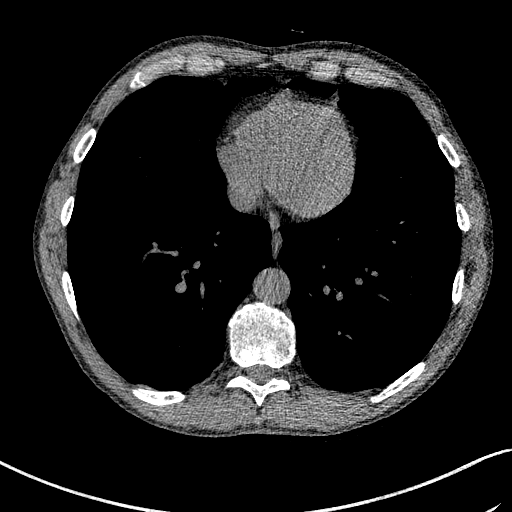
[im 167/453  lung]
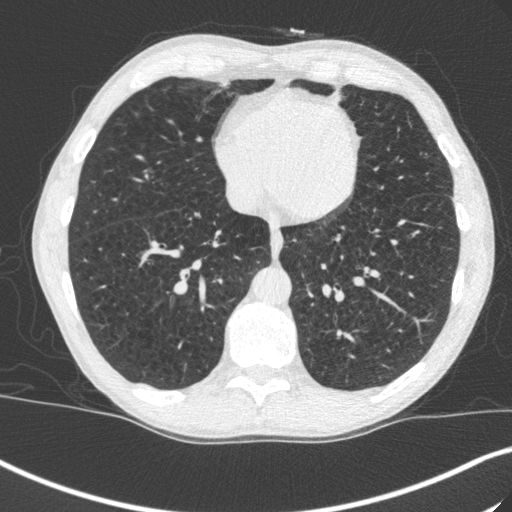
[im 215/453  lung]
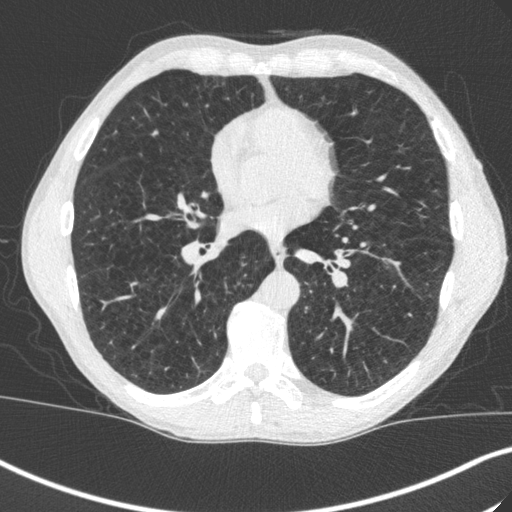
[im 238/453  lung]
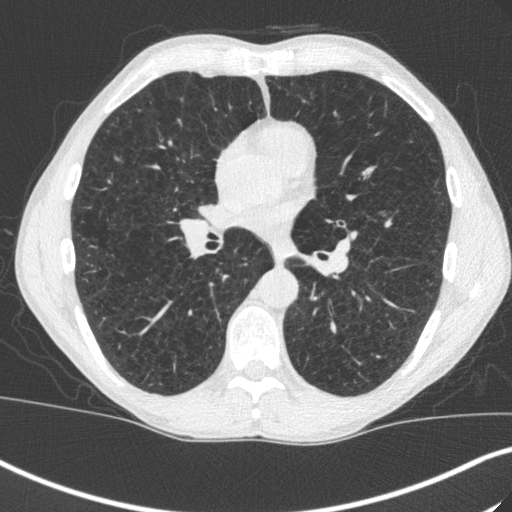
[im 286/453  lung]
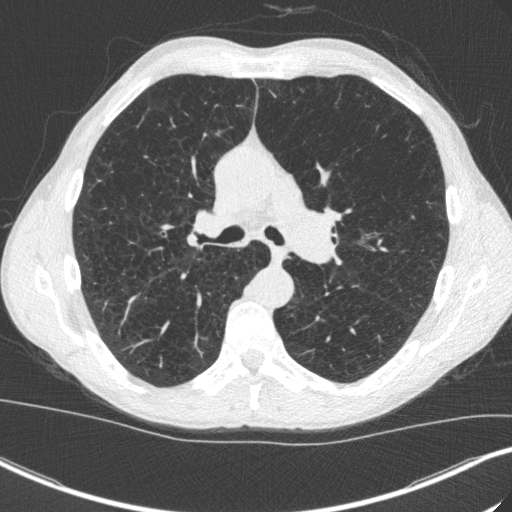
[im 310/453  mediastinal]
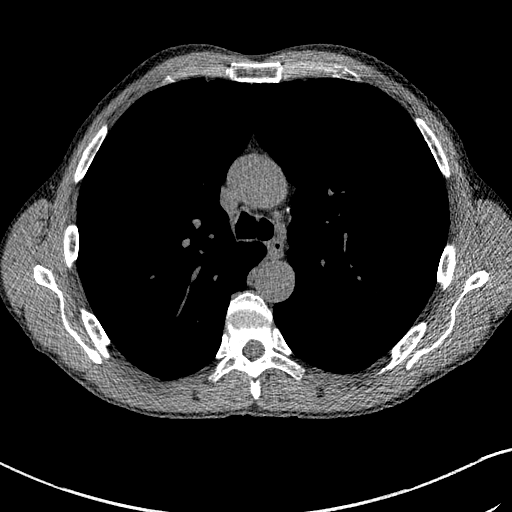
[im 310/453  lung]
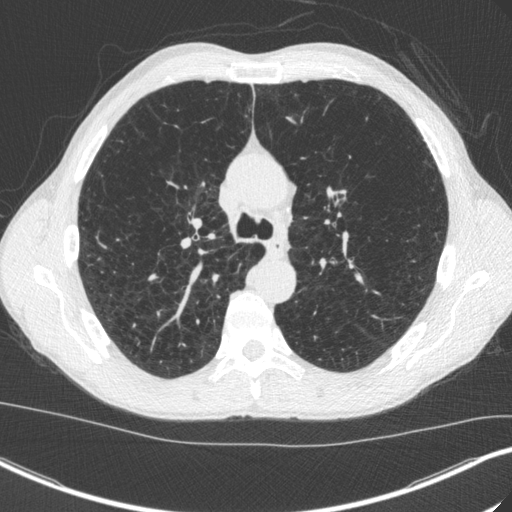
[im 357/453  lung]
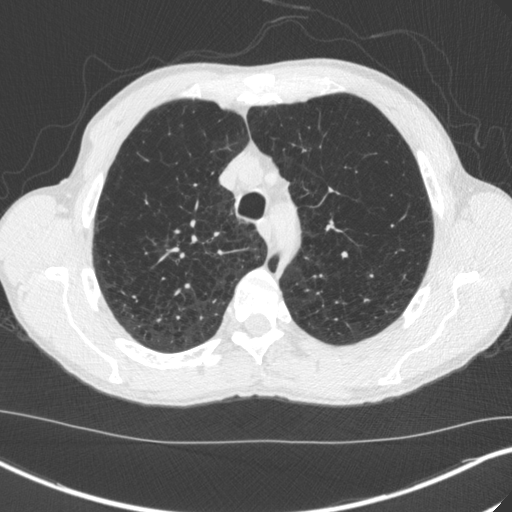
[im 381/453  lung]
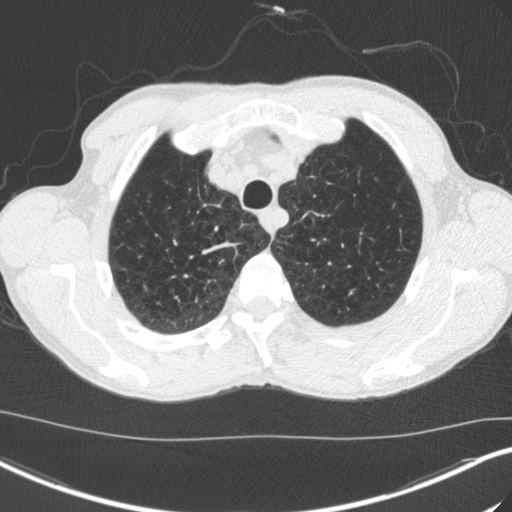
[im 429/453  lung]
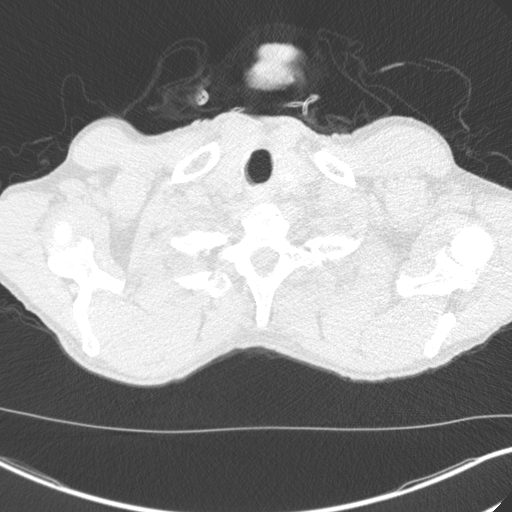

[15 of 36 positions shown; findings below may reference images not displayed]

FINDINGS: Cardiovascular: Calcified atheromatous plaque in the thoracic aorta
is mild. No aneurysmal dilation. Normal heart size. Normal caliber
of central pulmonary vessels. Limited assessment of cardiovascular
structures given lack of intravenous contrast.

Mediastinum/Nodes: No thoracic inlet, axillary, mediastinal or hilar
adenopathy. Esophagus grossly normal.

Lungs/Pleura: Marked pulmonary emphysema with centrilobular
predominance worse at the lung apices.

Small spiculated nodule at the RIGHT lung apex (image [DATE]) this
measures 13 x 6 mm and is stable as measured by this observer on the
prior study when measured in a similar fashion. Spiculation
extending towards the pleural surface is unchanged as well. When
compared to the exam of November 25, 2020 this is markedly improved the
solid component measuring up to 20 mm in greatest axial dimension.

Stable small nodule in the LEFT lower lobe measures approximately 6
mm. This is stable since 5904 and is compatible with a benign
nodule. No new pulmonary nodule. Airways are patent.

Upper Abdomen: Incidental imaging of upper abdominal contents
without acute finding.

Musculoskeletal: Spinal degenerative changes. No acute or
destructive bone process.
IMPRESSION: 1. Small spiculated nodule at the RIGHT lung apex is stable as
measured by this observer on the prior study when measured in a
similar fashion. When compared to the exam of November 25, 2020 this is
markedly improved the solid component measuring up to 20 mm in
greatest axial dimension. Given this improvement would consider the
more likely possibility of post infectious scarring. Consider 6-12
month follow-up to document continued stability and/or regression.
2. Marked pulmonary emphysema with centrilobular predominance worse
at the lung apices.
3. Emphysema and aortic atherosclerosis.

Aortic Atherosclerosis (1F9VA-6U0.0) and Emphysema (1F9VA-4TA.3).

## 2022-03-10 ENCOUNTER — Other Ambulatory Visit: Payer: Self-pay | Admitting: Internal Medicine

## 2022-03-10 ENCOUNTER — Other Ambulatory Visit: Payer: Self-pay | Admitting: Emergency Medicine

## 2022-03-16 ENCOUNTER — Telehealth: Payer: Self-pay | Admitting: Internal Medicine

## 2022-03-16 ENCOUNTER — Other Ambulatory Visit: Payer: Self-pay | Admitting: Internal Medicine

## 2022-03-16 DIAGNOSIS — J019 Acute sinusitis, unspecified: Secondary | ICD-10-CM

## 2022-03-16 NOTE — Telephone Encounter (Signed)
Patient called in requesting new order for nebulizer and mouth piece.  Can be sent in to [facility] a call back in regard.

## 2022-03-16 NOTE — Telephone Encounter (Signed)
Appt made and patient aware   Last tele

## 2022-03-16 NOTE — Telephone Encounter (Signed)
Pt will need an appt to discuss before we can send to pharmacy

## 2022-03-22 ENCOUNTER — Ambulatory Visit (INDEPENDENT_AMBULATORY_CARE_PROVIDER_SITE_OTHER): Payer: Medicare Other | Admitting: Internal Medicine

## 2022-03-22 ENCOUNTER — Encounter: Payer: Self-pay | Admitting: Internal Medicine

## 2022-03-22 DIAGNOSIS — Z1211 Encounter for screening for malignant neoplasm of colon: Secondary | ICD-10-CM

## 2022-03-22 DIAGNOSIS — J449 Chronic obstructive pulmonary disease, unspecified: Secondary | ICD-10-CM | POA: Diagnosis not present

## 2022-03-22 DIAGNOSIS — J9611 Chronic respiratory failure with hypoxia: Secondary | ICD-10-CM | POA: Diagnosis not present

## 2022-03-22 MED ORDER — NEBULIZER MASK ADULT MISC: 0 refills | Status: DC

## 2022-03-22 MED ORDER — BREZTRI AEROSPHERE 160-9-4.8 MCG/ACT IN AERO: 2.0000 | INHALATION_SPRAY | Freq: Two times a day (BID) | RESPIRATORY_TRACT | 11 refills | Status: DC

## 2022-03-22 MED ORDER — NEBULIZER AIR TUBE/PLUGS MISC: 0 refills | Status: DC

## 2022-03-22 NOTE — Progress Notes (Signed)
Virtual Visit via Telephone Note   This visit type was conducted due to national recommendations for restrictions regarding the COVID-19 Pandemic (e.g. social distancing) in an effort to limit this patient's exposure and mitigate transmission in our community.  Due to his co-morbid illnesses, this patient is at least at moderate risk for complications without adequate follow up.  This format is felt to be most appropriate for this patient at this time.  The patient did not have access to video technology/had technical difficulties with video requiring transitioning to audio format only (telephone).  All issues noted in this document were discussed and addressed.  No physical exam could be performed with this format.  Evaluation Performed:  Follow-up visit  Date:  03/22/2022   ID:  Kellar, Westberg 1956-09-08, MRN [id number]  Patient Location: Home Provider Location: Office/Clinic  Participants: Patient Location of Patient: Home Location of Provider: Telehealth Consent was obtain for visit to be over via telehealth. I verified that I am speaking with the correct person using two identifiers.  PCP:  Anabel Halon, MD   Chief Complaint: Dyspnea  History of Present Illness:    Jeffrey Krueger is a 66 y.o. male who has a televisit for c/o dyspnea on exertion, worse recently as he has been trying to walk.  He denies any fever or chills currently.  He reports using Breztri and as needed albuterol inhaler and nebulizer, but he needs new mouthpiece and tubing for nebulizer.  He also has home O2 for hypoxia, which he uses upon exertion and at nighttime.  The patient does not have symptoms concerning for COVID-19 infection (fever, chills, cough, or new shortness of breath).   Past Medical, Surgical, Social History, Allergies, and Medications have been Reviewed.  Past Medical History:  Diagnosis Date   COPD (chronic obstructive pulmonary disease) (HCC)    Headache(784.0)    Shortness of  breath    Past Surgical History:  Procedure Laterality Date   BACK SURGERY       Current Meds  Medication Sig   albuterol (PROVENTIL) (2.5 MG/3ML) 0.083% nebulizer solution INHALE CONTENTS OF 1 VIAL IN NEBULIZER EVERY 4 HOURS IF NEEDED FOR WHEEZING   albuterol (VENTOLIN HFA) 108 (90 Base) MCG/ACT inhaler TAKE 2 PUFFS BY MOUTH EVERY 4 HOURS AS NEEDED   Budeson-Glycopyrrol-Formoterol (BREZTRI AEROSPHERE) 160-9-4.8 MCG/ACT AERO Inhale 2 puffs into the lungs in the morning and at bedtime.   buPROPion (WELLBUTRIN SR) 200 MG 12 hr tablet TAKE 1 TABLET BY MOUTH TWICE A DAY   fluticasone (FLONASE) 50 MCG/ACT nasal spray SPRAY 2 SPRAYS INTO EACH NOSTRIL EVERY DAY   Olopatadine HCl 0.2 % SOLN Apply 1 drop to eye daily.   omeprazole (PRILOSEC) 40 MG capsule Take 1 capsule (40 mg total) by mouth daily.   predniSONE (DELTASONE) 5 MG tablet TAKE 3 TABLETS (15 MG TOTAL) BY MOUTH DAILY WITH BREAKFAST   traZODone (DESYREL) 150 MG tablet TAKE 1 TABLET BY MOUTH EVERY DAY IN THE EVENING     Allergies:   Patient has no known allergies.   ROS:   Please see the history of present illness.     All other systems reviewed and are negative.   Labs/Other Tests and Data Reviewed:    Recent Labs: No results found for requested labs within last 365 days.   Recent Lipid Panel Lab Results  Component Value Date/Time   CHOL 152 06/18/2013 08:44 AM   TRIG 89 06/18/2013 08:44 AM  HDL 55 06/18/2013 08:44 AM   CHOLHDL 2.8 06/18/2013 08:44 AM   LDLCALC 79 06/18/2013 08:44 AM    Wt Readings from Last 3 Encounters:  12/02/21 144 lb 3.2 oz (65.4 kg)  11/04/21 144 lb 9.6 oz (65.6 kg)  10/18/21 151 lb 0.8 oz (68.5 kg)     ASSESSMENT & PLAN:    COPD (chronic obstructive pulmonary disease) (HCC) On Breztri and as needed albuterol Also uses albuterol neb On oral steroids now Followed by Pulmonology  Chronic respiratory failure with hypoxia (HCC) Has been 2-3 l O2 at home since hospitalization for COPD  exacerbation in 08/2020 Underlying COPD and continuous tobacco abuse Albuterol nebulizer PRN, supplies sent  From 05/2021: Patient Saturations on Room Air at Rest = 96%   Patient Saturations on Room Air while Ambulating = 88%   Patient Saturations on 3 Liters of oxygen via POC while Ambulating = 93%   Time:   Today, I have spent 15 minutes reviewing the chart, including problem list, medications, and with the patient with telehealth technology discussing the above problems.   Medication Adjustments/Labs and Tests Ordered: Current medicines are reviewed at length with the patient today.  Concerns regarding medicines are outlined above.   Tests Ordered: Orders Placed This Encounter  Procedures   Cologuard    Medication Changes: No orders of the defined types were placed in this encounter.    Note: This dictation was prepared with Dragon dictation along with smaller phrase technology. Similar sounding words can be transcribed inadequately or may not be corrected upon review. Any transcriptional errors that result from this process are unintentional.      Disposition:  Follow up  Signed, Anabel Halon, MD  03/22/2022 12:03 PM     [facility]

## 2022-03-22 NOTE — Patient Instructions (Signed)
Please contact your Pulmonologist for portable oxygen supplies.  Please continue using Breztri regularly.

## 2022-03-22 NOTE — Assessment & Plan Note (Signed)
On Breztri and as needed albuterol Also uses albuterol neb On oral steroids now Followed by Pulmonology 

## 2022-03-22 NOTE — Assessment & Plan Note (Addendum)
Has been 2-3 l O2 at home since hospitalization for COPD exacerbation in 08/2020 Underlying COPD and continuous tobacco abuse Albuterol nebulizer PRN, supplies sent  From 05/2021: Patient Saturations on Room Air at Rest = 96%   Patient Saturations on Room Air while Ambulating = 88%   Patient Saturations on 3 Liters of oxygen via POC while Ambulating = 93% 

## 2022-03-30 ENCOUNTER — Other Ambulatory Visit: Payer: Self-pay | Admitting: Emergency Medicine

## 2022-03-30 ENCOUNTER — Other Ambulatory Visit: Payer: Self-pay | Admitting: Internal Medicine

## 2022-03-30 DIAGNOSIS — K2950 Unspecified chronic gastritis without bleeding: Secondary | ICD-10-CM

## 2022-04-17 ENCOUNTER — Encounter: Payer: Self-pay | Admitting: Internal Medicine

## 2022-04-17 ENCOUNTER — Ambulatory Visit (INDEPENDENT_AMBULATORY_CARE_PROVIDER_SITE_OTHER): Payer: Medicare Other | Admitting: Internal Medicine

## 2022-04-17 VITALS — BP 132/68 | HR 90 | Ht 69.0 in | Wt 140.4 lb

## 2022-04-17 DIAGNOSIS — F419 Anxiety disorder, unspecified: Secondary | ICD-10-CM | POA: Diagnosis not present

## 2022-04-17 DIAGNOSIS — E782 Mixed hyperlipidemia: Secondary | ICD-10-CM

## 2022-04-17 DIAGNOSIS — Z1211 Encounter for screening for malignant neoplasm of colon: Secondary | ICD-10-CM

## 2022-04-17 DIAGNOSIS — Z1159 Encounter for screening for other viral diseases: Secondary | ICD-10-CM | POA: Diagnosis not present

## 2022-04-17 DIAGNOSIS — J449 Chronic obstructive pulmonary disease, unspecified: Secondary | ICD-10-CM | POA: Diagnosis not present

## 2022-04-17 DIAGNOSIS — J9621 Acute and chronic respiratory failure with hypoxia: Secondary | ICD-10-CM | POA: Insufficient documentation

## 2022-04-17 DIAGNOSIS — R7303 Prediabetes: Secondary | ICD-10-CM | POA: Diagnosis not present

## 2022-04-17 DIAGNOSIS — Z23 Encounter for immunization: Secondary | ICD-10-CM | POA: Diagnosis not present

## 2022-04-17 DIAGNOSIS — E559 Vitamin D deficiency, unspecified: Secondary | ICD-10-CM | POA: Diagnosis not present

## 2022-04-17 DIAGNOSIS — F17210 Nicotine dependence, cigarettes, uncomplicated: Secondary | ICD-10-CM

## 2022-04-17 DIAGNOSIS — Z720 Tobacco use: Secondary | ICD-10-CM

## 2022-04-17 MED ORDER — TRAZODONE HCL 150 MG PO TABS: 150.0000 mg | ORAL_TABLET | Freq: Every day | ORAL | 1 refills | Status: DC

## 2022-04-17 NOTE — Assessment & Plan Note (Signed)
Was uncontrolled with Wellbutrin 150 mg twice daily, had increased dose to 200 mg twice daily Continue trazodone 150 mg nightly for insomnia Avoid increasing dose of Wellbutrin or adding any anxiolytic to avoid drowsiness

## 2022-04-17 NOTE — Assessment & Plan Note (Signed)
Smokes 0.5 pack/day currently  Asked about quitting: confirms that he currently smokes cigarettes Advise to quit smoking: Educated about QUITTING to reduce the risk of cancer, cardio and cerebrovascular disease. Assess willingness: Unwilling to quit at this time, but is working on cutting back. Assist with counseling and pharmacotherapy: Counseled for 5 minutes and literature provided. On Wellbutrin. Arrange for follow up: follow up in 3 months and continue to offer help.  Advised for staying away from cigarettes while on home oxygen.

## 2022-04-17 NOTE — Progress Notes (Signed)
Established Patient Office Visit  Subjective:  Patient ID: Jeffrey Krueger, male    DOB: 1956-02-20  Age: 66 y.o. MRN: [id number]  CC:  Chief Complaint  Patient presents with   COPD   Anxiety    HPI Jeffrey Krueger is a 66 y.o. male with past medical history of COPD, hypoxic respiratory failure, anxiety and tobacco abuse who presents for f/u of his chronic medical conditions.  He has been using Breztri and as needed albuterol for COPD.  He follows up with Pulmonology for COPD and chronic hypoxic respiratory failure. He has continuous home O2 for it now.  He still smokes about 0.5 pack/day.  He is on oral prednisone for acute on chronic hypoxia.  He complains of persistent anxiety despite taking Wellbutrin.  He denies any anhedonia.  He has anxiety at nighttime when he has to use O2 while sleeping.  He takes trazodone for insomnia as well.  He denies any SI or HI currently.     Past Medical History:  Diagnosis Date   COPD (chronic obstructive pulmonary disease) (HCC)    Headache(784.0)    Shortness of breath     Past Surgical History:  Procedure Laterality Date   BACK SURGERY      History reviewed. No pertinent family history.  Social History   Socioeconomic History   Marital status: Married    Spouse name: Not on file   Number of children: Not on file   Years of education: Not on file   Highest education level: Not on file  Occupational History   Not on file  Tobacco Use   Smoking status: Every Day    Packs/day: 0.50    Years: 44.00    Total pack years: 22.00    Types: Cigarettes    Start date: 02/06/1974   Smokeless tobacco: Never   Tobacco comments:     10 cigarettes a day ARJ 12/02/21  Substance and Sexual Activity   Alcohol use: No    Alcohol/week: 0.0 standard drinks of alcohol   Drug use: No   Sexual activity: Not on file  Other Topics Concern   Not on file  Social History Narrative   Not on file   Social Determinants of Health   Financial  Resource Strain: Not on file  Food Insecurity: Not on file  Transportation Needs: Not on file  Physical Activity: Not on file  Stress: Not on file  Social Connections: Not on file  Intimate Partner Violence: Not on file    Outpatient Medications Prior to Visit  Medication Sig Dispense Refill   albuterol (PROVENTIL) (2.5 MG/3ML) 0.083% nebulizer solution INHALE CONTENTS OF 1 VIAL IN NEBULIZER EVERY 4 HOURS IF NEEDED FOR WHEEZING 450 mL 3   albuterol (VENTOLIN HFA) 108 (90 Base) MCG/ACT inhaler INHALE 2 PUFFS BY MOUTH EVERY 4 HOURS AS NEEDED 18 each 0   Budeson-Glycopyrrol-Formoterol (BREZTRI AEROSPHERE) 160-9-4.8 MCG/ACT AERO Inhale 2 puffs into the lungs in the morning and at bedtime. 10.7 g 11   buPROPion (WELLBUTRIN SR) 200 MG 12 hr tablet TAKE 1 TABLET BY MOUTH TWICE A DAY 180 tablet 1   fluticasone (FLONASE) 50 MCG/ACT nasal spray SPRAY 2 SPRAYS INTO EACH NOSTRIL EVERY DAY 48 mL 0   Olopatadine HCl 0.2 % SOLN Apply 1 drop to eye daily. 2.5 mL 0   omeprazole (PRILOSEC) 40 MG capsule TAKE 1 CAPSULE (40 MG TOTAL) BY MOUTH DAILY. 90 capsule 1   predniSONE (DELTASONE) 20 MG tablet TAKE 1  TABLET BY MOUTH DAILY WITH BREAKFAST 30 tablet 0   predniSONE (DELTASONE) 5 MG tablet TAKE 3 TABLETS (15 MG TOTAL) BY MOUTH DAILY WITH BREAKFAST 120 tablet 2   Respiratory Therapy Supplies (NEBULIZER AIR TUBE/PLUGS) MISC For Albuterol nebulization. 1 each 0   Respiratory Therapy Supplies (NEBULIZER MASK ADULT) MISC For Albuterol nebulization. 1 each 0   traZODone (DESYREL) 150 MG tablet TAKE 1 TABLET BY MOUTH EVERY DAY IN THE EVENING 90 tablet 1   No facility-administered medications prior to visit.    No Known Allergies  ROS Review of Systems  Constitutional:  Positive for fatigue. Negative for chills and fever.  HENT:  Negative for congestion and sore throat.   Eyes:  Negative for pain and discharge.  Respiratory:  Positive for cough, shortness of breath and wheezing.   Cardiovascular:  Negative  for chest pain and palpitations.  Gastrointestinal:  Negative for constipation, diarrhea, nausea and vomiting.  Endocrine: Negative for polydipsia and polyuria.  Genitourinary:  Negative for dysuria and hematuria.  Musculoskeletal:  Negative for neck pain and neck stiffness.  Skin:  Negative for rash.  Neurological:  Negative for dizziness, weakness, numbness and headaches.  Psychiatric/Behavioral:  Positive for sleep disturbance. Negative for agitation and behavioral problems. The patient is nervous/anxious.       Objective:    Physical Exam Vitals reviewed.  Constitutional:      General: He is not in acute distress.    Appearance: He is not diaphoretic.  HENT:     Head: Normocephalic and atraumatic.     Nose: Nose normal.     Mouth/Throat:     Mouth: Mucous membranes are moist.  Eyes:     General: No scleral icterus.    Extraocular Movements: Extraocular movements intact.  Cardiovascular:     Rate and Rhythm: Normal rate and regular rhythm.     Pulses: Normal pulses.     Heart sounds: Normal heart sounds. No murmur heard. Pulmonary:     Breath sounds: Wheezing (Mild) present. No rales.     Comments: O2 - 3 lpm Musculoskeletal:     Cervical back: Neck supple. No tenderness.     Right lower leg: No edema.     Left lower leg: No edema.  Skin:    General: Skin is warm.     Findings: No rash.  Neurological:     General: No focal deficit present.     Mental Status: He is alert and oriented to person, place, and time.     Sensory: No sensory deficit.     Motor: Weakness (4/5 - b/l LE) present.  Psychiatric:        Mood and Affect: Mood normal.        Behavior: Behavior normal.     BP 132/68   Pulse 90   Ht $R'5\' 9"'Lt$  (1.753 m)   Wt 140 lb 6.4 oz (63.7 kg)   SpO2 94%   BMI 20.73 kg/m  Wt Readings from Last 3 Encounters:  04/17/22 140 lb 6.4 oz (63.7 kg)  12/02/21 144 lb 3.2 oz (65.4 kg)  11/04/21 144 lb 9.6 oz (65.6 kg)    Lab Results  Component Value Date   TSH  0.986 11/29/2015   Lab Results  Component Value Date   WBC 23.3 (H) 07/07/2016   HGB 15.2 07/07/2016   HCT 44.2 07/07/2016   MCV 93.6 07/07/2016   PLT 216 07/07/2016   Lab Results  Component Value Date   NA 138 07/07/2016  K 3.9 07/07/2016   CO2 24 07/07/2016   GLUCOSE 183 (H) 07/07/2016   BUN 26 (H) 07/07/2016   CREATININE 1.13 07/07/2016   BILITOT 1.2 06/12/2013   ALKPHOS 86 06/12/2013   AST 18 06/12/2013   ALT 22 06/12/2013   PROT 6.8 06/12/2013   ALBUMIN 4.0 06/12/2013   CALCIUM 8.9 07/07/2016   ANIONGAP 10 07/07/2016   Lab Results  Component Value Date   CHOL 152 06/18/2013   Lab Results  Component Value Date   HDL 55 06/18/2013   Lab Results  Component Value Date   LDLCALC 79 06/18/2013   Lab Results  Component Value Date   TRIG 89 06/18/2013   Lab Results  Component Value Date   CHOLHDL 2.8 06/18/2013   No results found for: "HGBA1C"    Assessment & Plan:   Problem List Items Addressed This Visit       Respiratory   COPD (chronic obstructive pulmonary disease) ([place]) - Primary    On Breztri and as needed albuterol Also uses albuterol neb On oral steroids now Followed by Pulmonology      Relevant Orders   TSH   CMP14+EGFR   CBC with Differential/Platelet   Acute on chronic respiratory failure with hypoxia (HCC)    Due to COPD exacerbation recently On oral steroids Has home O2-3 lpm      Relevant Orders   TSH   CMP14+EGFR   CBC with Differential/Platelet     Other   Tobacco abuse    Smokes 0.5 pack/day currently  Asked about quitting: confirms that he currently smokes cigarettes Advise to quit smoking: Educated about QUITTING to reduce the risk of cancer, cardio and cerebrovascular disease. Assess willingness: Unwilling to quit at this time, but is working on cutting back. Assist with counseling and pharmacotherapy: Counseled for 5 minutes and literature provided. On Wellbutrin. Arrange for follow up: follow up in 3 months and  continue to offer help.  Advised for staying away from cigarettes while on home oxygen.      Anxiety    Was uncontrolled with Wellbutrin 150 mg twice daily, had increased dose to 200 mg twice daily Continue trazodone 150 mg nightly for insomnia Avoid increasing dose of Wellbutrin or adding any anxiolytic to avoid drowsiness      Relevant Medications   traZODone (DESYREL) 150 MG tablet   Other Relevant Orders   TSH   CMP14+EGFR   CBC with Differential/Platelet   Other Visit Diagnoses     Prediabetes       Relevant Orders   Hemoglobin A1c   Mixed hyperlipidemia       Relevant Orders   Lipid panel   Vitamin D deficiency       Relevant Orders   VITAMIN D 25 Hydroxy (Vit-D Deficiency, Fractures)   Need for hepatitis C screening test       Relevant Orders   Hepatitis C Antibody   Colon cancer screening       Relevant Orders   Ambulatory referral to Gastroenterology   Need for 23-polyvalent pneumococcal polysaccharide vaccine       Relevant Orders   Pneumococcal polysaccharide vaccine 23-valent greater than or equal to 2yo subcutaneous/IM (Completed)       Meds ordered this encounter  Medications   traZODone (DESYREL) 150 MG tablet    Sig: Take 1 tablet (150 mg total) by mouth at bedtime.    Dispense:  90 tablet    Refill:  1    Follow-up:  Return in about 6 months (around 10/18/2022).    Lindell Spar, MD

## 2022-04-17 NOTE — Patient Instructions (Signed)
Please continue taking medications as prescribed.  Please consider getting Shingrix and Tdap vaccine at your local pharmacy.

## 2022-04-17 NOTE — Assessment & Plan Note (Signed)
On Breztri and as needed albuterol Also uses albuterol neb On oral steroids now Followed by Pulmonology

## 2022-04-17 NOTE — Assessment & Plan Note (Signed)
Due to COPD exacerbation recently On oral steroids Has home O2-3 lpm

## 2022-04-18 ENCOUNTER — Encounter: Payer: Self-pay | Admitting: *Deleted

## 2022-04-19 ENCOUNTER — Other Ambulatory Visit: Payer: Self-pay | Admitting: Internal Medicine

## 2022-04-19 ENCOUNTER — Telehealth: Payer: Self-pay | Admitting: Internal Medicine

## 2022-04-19 DIAGNOSIS — E559 Vitamin D deficiency, unspecified: Secondary | ICD-10-CM | POA: Insufficient documentation

## 2022-04-19 MED ORDER — VITAMIN D (ERGOCALCIFEROL) 1.25 MG (50000 UNIT) PO CAPS: 50000.0000 [IU] | ORAL_CAPSULE | ORAL | 1 refills | Status: DC

## 2022-04-19 NOTE — Telephone Encounter (Signed)
Patient return call for lab results 

## 2022-04-20 LAB — HEMOGLOBIN A1C
Est. average glucose Bld gHb Est-mCnc: 103 mg/dL
Hgb A1c MFr Bld: 5.2 % (ref 4.8–5.6)

## 2022-04-20 LAB — TSH: TSH: 1.34 u[IU]/mL (ref 0.450–4.500)

## 2022-04-20 LAB — CMP14+EGFR
ALT: 34 IU/L (ref 0–44)
AST: 30 IU/L (ref 0–40)
Albumin/Globulin Ratio: 1.6 (ref 1.2–2.2)
Albumin: 3.7 g/dL — ABNORMAL LOW (ref 3.9–4.9)
Alkaline Phosphatase: 108 IU/L (ref 44–121)
BUN/Creatinine Ratio: 20 (ref 10–24)
BUN: 18 mg/dL (ref 8–27)
Bilirubin Total: 0.7 mg/dL (ref 0.0–1.2)
CO2: 24 mmol/L (ref 20–29)
Calcium: 9.1 mg/dL (ref 8.6–10.2)
Chloride: 102 mmol/L (ref 96–106)
Creatinine, Ser: 0.9 mg/dL (ref 0.76–1.27)
Globulin, Total: 2.3 g/dL (ref 1.5–4.5)
Glucose: 92 mg/dL (ref 70–99)
Sodium: 140 mmol/L (ref 134–144)
Total Protein: 6 g/dL (ref 6.0–8.5)
eGFR: 94 mL/min/{1.73_m2} (ref >59)

## 2022-04-20 LAB — VITAMIN D 25 HYDROXY (VIT D DEFICIENCY, FRACTURES): Vit D, 25-Hydroxy: 5.3 ng/mL — ABNORMAL LOW (ref 30.0–100.0)

## 2022-04-20 LAB — LIPID PANEL
Chol/HDL Ratio: 3.4 ratio (ref 0.0–5.0)
Cholesterol, Total: 157 mg/dL (ref 100–199)
HDL: 46 mg/dL (ref >39)
LDL Chol Calc (NIH): 92 mg/dL (ref 0–99)
Triglycerides: 106 mg/dL (ref 0–149)
VLDL Cholesterol Cal: 19 mg/dL (ref 5–40)

## 2022-04-20 LAB — CBC WITH DIFFERENTIAL/PLATELET
Basophils Absolute: 0.1 10*3/uL (ref 0.0–0.2)
Basos: 1 %
EOS (ABSOLUTE): 0.1 10*3/uL (ref 0.0–0.4)
Eos: 1 %
Hematocrit: 41.4 % (ref 37.5–51.0)
Hemoglobin: 13.8 g/dL (ref 13.0–17.7)
Immature Grans (Abs): 0.4 10*3/uL — ABNORMAL HIGH (ref 0.0–0.1)
Immature Granulocytes: 3 %
Lymphocytes Absolute: 1 10*3/uL (ref 0.7–3.1)
Lymphs: 6 %
MCH: 31.6 pg (ref 26.6–33.0)
MCHC: 33.3 g/dL (ref 31.5–35.7)
MCV: 95 fL (ref 79–97)
Monocytes Absolute: 0.7 10*3/uL (ref 0.1–0.9)
Monocytes: 5 %
Neutrophils Absolute: 12.7 10*3/uL — ABNORMAL HIGH (ref 1.4–7.0)
Neutrophils: 84 %
Platelets: 329 10*3/uL (ref 150–450)
RBC: 4.37 x10E6/uL (ref 4.14–5.80)
RDW: 11.8 % (ref 11.6–15.4)
WBC: 14.9 10*3/uL — ABNORMAL HIGH (ref 3.4–10.8)

## 2022-04-20 LAB — HEPATITIS C ANTIBODY: Hep C Virus Ab: NONREACTIVE

## 2022-04-20 NOTE — Telephone Encounter (Signed)
Patient notified of lab results with verbal understanding 

## 2022-05-02 ENCOUNTER — Other Ambulatory Visit: Payer: Self-pay | Admitting: Internal Medicine

## 2022-05-02 ENCOUNTER — Telehealth: Payer: Self-pay | Admitting: Internal Medicine

## 2022-05-02 ENCOUNTER — Other Ambulatory Visit: Payer: Self-pay | Admitting: Emergency Medicine

## 2022-05-02 ENCOUNTER — Ambulatory Visit (HOSPITAL_COMMUNITY)
Admission: RE | Admit: 2022-05-02 | Discharge: 2022-05-02 | Disposition: A | Discharge status: Home / Self Care | Payer: Medicare Other | Source: Ambulatory Visit | Attending: Internal Medicine | Admitting: Internal Medicine

## 2022-05-02 DIAGNOSIS — Z0279 Encounter for issue of other medical certificate: Secondary | ICD-10-CM

## 2022-05-02 DIAGNOSIS — R911 Solitary pulmonary nodule: Secondary | ICD-10-CM

## 2022-05-02 DIAGNOSIS — J479 Bronchiectasis, uncomplicated: Secondary | ICD-10-CM | POA: Diagnosis not present

## 2022-05-02 DIAGNOSIS — J929 Pleural plaque without asbestos: Secondary | ICD-10-CM | POA: Diagnosis not present

## 2022-05-02 DIAGNOSIS — J019 Acute sinusitis, unspecified: Secondary | ICD-10-CM

## 2022-05-02 DIAGNOSIS — J432 Centrilobular emphysema: Secondary | ICD-10-CM | POA: Diagnosis not present

## 2022-05-02 DIAGNOSIS — I700 Atherosclerosis of aorta: Secondary | ICD-10-CM | POA: Diagnosis not present

## 2022-05-02 DIAGNOSIS — J449 Chronic obstructive pulmonary disease, unspecified: Secondary | ICD-10-CM

## 2022-05-02 NOTE — Telephone Encounter (Signed)
Disability Claims forms   Noted Copied  Sleeved  Original placed in providers box Copy in brown folder

## 2022-05-02 NOTE — Telephone Encounter (Signed)
Dr. Byrum, please advise on med refill. ?

## 2022-05-04 NOTE — Telephone Encounter (Signed)
Called patient left voicemail forms are ready for pick up

## 2022-05-05 ENCOUNTER — Telehealth: Payer: Self-pay | Admitting: Internal Medicine

## 2022-05-05 NOTE — Telephone Encounter (Signed)
Pt called stating he just spoke with you about some disability pwk. States he has some questions. Asked to please have you call him?

## 2022-05-05 NOTE — Telephone Encounter (Signed)
I did not speak with patient

## 2022-05-05 NOTE — Telephone Encounter (Signed)
Spoke to patient will pick up forms.

## 2022-05-10 ENCOUNTER — Other Ambulatory Visit: Payer: Self-pay | Admitting: Internal Medicine

## 2022-05-10 ENCOUNTER — Other Ambulatory Visit (HOSPITAL_COMMUNITY): Payer: Medicare Other

## 2022-05-31 ENCOUNTER — Telehealth: Payer: Self-pay | Admitting: Internal Medicine

## 2022-05-31 NOTE — Telephone Encounter (Signed)
Patient returning call from VM he received

## 2022-05-31 NOTE — Telephone Encounter (Signed)
Scheduled awv

## 2022-06-05 ENCOUNTER — Other Ambulatory Visit: Payer: Self-pay | Admitting: Emergency Medicine

## 2022-06-05 ENCOUNTER — Other Ambulatory Visit: Payer: Self-pay | Admitting: Internal Medicine

## 2022-06-05 DIAGNOSIS — J449 Chronic obstructive pulmonary disease, unspecified: Secondary | ICD-10-CM

## 2022-06-05 DIAGNOSIS — J209 Acute bronchitis, unspecified: Secondary | ICD-10-CM

## 2022-06-15 ENCOUNTER — Ambulatory Visit (INDEPENDENT_AMBULATORY_CARE_PROVIDER_SITE_OTHER): Payer: Medicare Other

## 2022-06-15 DIAGNOSIS — Z0000 Encounter for general adult medical examination without abnormal findings: Secondary | ICD-10-CM

## 2022-06-15 NOTE — Progress Notes (Signed)
Subjective:   Jeffrey Krueger is a 66 y.o. male who presents for Medicare Annual/Subsequent preventive examination.  Review of Systems    I connected with  Jeffrey Krueger on 06/15/22 by a audio enabled telemedicine application and verified that I am speaking with the correct person using two identifiers.  Patient Location: Home  Provider Location: Office/Clinic  I discussed the limitations of evaluation and management by telemedicine. The patient expressed understanding and agreed to proceed.  Cardiac Risk Factors include: none     Objective:    Today's Vitals   06/15/22 1105  PainSc: 5    There is no height or weight on file to calculate BMI.     06/15/2022   11:10 AM 07/07/2016   10:48 PM 09/20/2013   11:31 PM  Advanced Directives  Does Patient Have a Medical Advance Directive? No No Patient would not like information;Patient does not have advance directive  Would patient like information on creating a medical advance directive? No - Patient declined No - patient declined information   Pre-existing out of facility DNR order (yellow form or pink MOST form)   No    Current Medications (verified) Outpatient Encounter Medications as of 06/15/2022  Medication Sig   albuterol (PROVENTIL) (2.5 MG/3ML) 0.083% nebulizer solution INHALE CONTENTS OF 1 VIAL IN NEBULIZER EVERY 4 HOURS IF NEEDED FOR WHEEZING   albuterol (VENTOLIN HFA) 108 (90 Base) MCG/ACT inhaler INHALE 2 PUFFS BY MOUTH EVERY 4 HOURS AS NEEDED   Budeson-Glycopyrrol-Formoterol (BREZTRI AEROSPHERE) 160-9-4.8 MCG/ACT AERO Inhale 2 puffs into the lungs in the morning and at bedtime.   buPROPion (WELLBUTRIN SR) 200 MG 12 hr tablet TAKE 1 TABLET BY MOUTH TWICE A DAY   fluticasone (FLONASE) 50 MCG/ACT nasal spray SPRAY 2 SPRAYS INTO EACH NOSTRIL EVERY DAY   Olopatadine HCl 0.2 % SOLN Apply 1 drop to eye daily.   omeprazole (PRILOSEC) 40 MG capsule TAKE 1 CAPSULE (40 MG TOTAL) BY MOUTH DAILY.   predniSONE (DELTASONE) 20 MG  tablet TAKE 1 TABLET BY MOUTH DAILY WITH BREAKFAST   predniSONE (DELTASONE) 5 MG tablet TAKE 3 TABLETS (15 MG TOTAL) BY MOUTH DAILY WITH BREAKFAST   Respiratory Therapy Supplies (NEBULIZER AIR TUBE/PLUGS) MISC For Albuterol nebulization.   Respiratory Therapy Supplies (NEBULIZER MASK ADULT) MISC For Albuterol nebulization.   traZODone (DESYREL) 150 MG tablet Take 1 tablet (150 mg total) by mouth at bedtime.   Vitamin D, Ergocalciferol, (DRISDOL) 1.25 MG (50000 UNIT) CAPS capsule Take 1 capsule (50,000 Units total) by mouth every 7 (seven) days.   No facility-administered encounter medications on file as of 06/15/2022.    Allergies (verified) Patient has no known allergies.   History: Past Medical History:  Diagnosis Date   COPD (chronic obstructive pulmonary disease) (HCC)    Headache(784.0)    Shortness of breath    Past Surgical History:  Procedure Laterality Date   BACK SURGERY     No family history on file. Social History   Socioeconomic History   Marital status: Married    Spouse name: Not on file   Number of children: Not on file   Years of education: Not on file   Highest education level: Not on file  Occupational History   Not on file  Tobacco Use   Smoking status: Every Day    Packs/day: 0.50    Years: 44.00    Total pack years: 22.00    Types: Cigarettes    Start date: 02/06/1974   Smokeless tobacco:  Never   Tobacco comments:     10 cigarettes a day ARJ 12/02/21  Substance and Sexual Activity   Alcohol use: No    Alcohol/week: 0.0 standard drinks of alcohol   Drug use: No   Sexual activity: Not on file  Other Topics Concern   Not on file  Social History Narrative   Not on file   Social Determinants of Health   Financial Resource Strain: Low Risk  (06/15/2022)   Overall Financial Resource Strain (CARDIA)    Difficulty of Paying Living Expenses: Not hard at all  Food Insecurity: No Food Insecurity (06/15/2022)   Hunger Vital Sign    Worried About  Running Out of Food in the Last Year: Never true    Ran Out of Food in the Last Year: Never true  Transportation Needs: No Transportation Needs (06/15/2022)   PRAPARE - Administrator, Civil Service (Medical): No    Lack of Transportation (Non-Medical): No  Physical Activity: Inactive (06/15/2022)   Exercise Vital Sign    Days of Exercise per Week: 0 days    Minutes of Exercise per Session: 0 min  Stress: No Stress Concern Present (06/15/2022)   [name] of Occupational Health - Occupational Stress Questionnaire    Feeling of Stress : Not at all  Social Connections: Socially Isolated (06/15/2022)   Social Connection and Isolation Panel [NHANES]    Frequency of Communication with Friends and Family: Once a week    Frequency of Social Gatherings with Friends and Family: Never    Attends Religious Services: Never    Database administrator or Organizations: No    Attends Banker Meetings: Never    Marital Status: Married    Tobacco Counseling Ready to quit: Not Answered Counseling given: Not Answered Tobacco comments:  10 cigarettes a day ARJ 12/02/21   Clinical Intake:  Jeffrey Krueger , Thank you for taking time to come for your Medicare Wellness Visit. I appreciate your ongoing commitment to your health goals. Please review the following plan we discussed and let me know if I can assist you in the future.   These are the goals we discussed:  Goals      Patient Stated     Get better so he can do more.        This is a list of the screening recommended for you and due dates:  Health Maintenance  Topic Date Due   COVID-19 Vaccine (1) Never done   Tetanus Vaccine  Never done   Colon Cancer Screening  Never done   Zoster (Shingles) Vaccine (1 of 2) Never done   Flu Shot  04/25/2022   Pneumonia Vaccine  Completed   Hepatitis C Screening: USPSTF Recommendation to screen - Ages 38-79 yo.  Completed   HPV Vaccine  Aged Out    Pre-visit preparation  completed: Yes  Diabetic?NO  Interpreter Needed?: No Activities of Daily Living    06/15/2022   11:10 AM  In your present state of health, do you have any difficulty performing the following activities:  Hearing? 0  Vision? 1  Difficulty concentrating or making decisions? 0  Walking or climbing stairs? 1  Dressing or bathing? 0  Doing errands, shopping? 1  Preparing Food and eating ? N  Using the Toilet? N  In the past six months, have you accidently leaked urine? N  Do you have problems with loss of bowel control? N  Managing your Medications? N  Managing your Finances? N  Housekeeping or managing your Housekeeping? N    Patient Care Team: Anabel Halon, MD as PCP - General (Internal Medicine)  Indicate any recent Medical Services you may have received from other than [facility] providers in the past year (date may be approximate).     Assessment:   This is a routine wellness examination for [place].  Hearing/Vision screen No results found.  Dietary issues and exercise activities discussed: Current Exercise Habits: The patient does not participate in regular exercise at present, Exercise limited by: None identified   Goals Addressed             This Visit's Progress    Patient Stated       Get better so he can do more.      Depression Screen    06/15/2022   11:10 AM 04/17/2022    2:20 PM 03/22/2022   10:20 AM 10/18/2021    1:58 PM 02/02/2021    2:12 PM 01/12/2021    2:56 PM 12/08/2020    1:25 PM  PHQ 2/9 Scores  PHQ - 2 Score 1 0 3 0 0 1 1  PHQ- 9 Score   12        Fall Risk    06/15/2022   11:10 AM 04/17/2022    2:20 PM 03/22/2022   10:20 AM 10/18/2021    1:58 PM 02/02/2021    2:12 PM  Fall Risk   Falls in the past year? 0 0 0 0 0  Number falls in past yr: 0 0 0 0 0  Injury with Fall? 0 0 0 0 0  Risk for fall due to : No Fall Risks No Fall Risks No Fall Risks No Fall Risks No Fall Risks  Follow up Falls evaluation completed Falls evaluation completed  Falls evaluation completed Falls evaluation completed Falls evaluation completed    FALL RISK PREVENTION PERTAINING TO THE HOME:  Any stairs in or around the home? Yes  If so, are there any without handrails? Yes  Home free of loose throw rugs in walkways, pet beds, electrical cords, etc? Yes  Adequate lighting in your home to reduce risk of falls? Yes   ASSISTIVE DEVICES UTILIZED TO PREVENT FALLS:  Life alert? No  Use of a cane, walker or w/c? Yes walker Grab bars in the bathroom? No  Shower chair or bench in shower? Yes  Elevated toilet seat or a handicapped toilet? No     Immunizations Immunization History  Administered Date(s) Administered   Influenza,inj,Quad PF,6+ Mos 08/30/2018, 11/02/2019, 10/27/2020   Influenza-Unspecified 06/26/2015, 07/27/2019   Pneumococcal Conjugate-13 12/08/2020   Pneumococcal Polysaccharide-23 09/05/2013, 04/17/2022    TDAP status: Due, Education has been provided regarding the importance of this vaccine. Advised may receive this vaccine at local pharmacy or Health Dept. Aware to provide a copy of the vaccination record if obtained from local pharmacy or Health Dept. Verbalized acceptance and understanding.  Flu Vaccine status: Due, Education has been provided regarding the importance of this vaccine. Advised may receive this vaccine at local pharmacy or Health Dept. Aware to provide a copy of the vaccination record if obtained from local pharmacy or Health Dept. Verbalized acceptance and understanding.  Pneumococcal vaccine status: Up to date  Covid-19 vaccine status: Declined, Education has been provided regarding the importance of this vaccine but patient still declined. Advised may receive this vaccine at local pharmacy or Health Dept.or vaccine clinic. Aware to provide a copy of the vaccination record  if obtained from local pharmacy or Health Dept. Verbalized acceptance and understanding.  Qualifies for Shingles Vaccine? Yes   Zostavax  completed No   Shingrix Completed?: No.    Education has been provided regarding the importance of this vaccine. Patient has been advised to call insurance company to determine out of pocket expense if they have not yet received this vaccine. Advised may also receive vaccine at local pharmacy or Health Dept. Verbalized acceptance and understanding.  Screening Tests Health Maintenance  Topic Date Due   COVID-19 Vaccine (1) Never done   TETANUS/TDAP  Never done   COLONOSCOPY (Pts [id number] Insurance coverage will need to be confirmed)  Never done   Zoster Vaccines- Shingrix (1 of 2) Never done   INFLUENZA VACCINE  04/25/2022   Pneumonia Vaccine 5165+ Years old  Completed   Hepatitis C Screening  Completed   HPV VACCINES  Aged Out    Health Maintenance  Health Maintenance Due  Topic Date Due   COVID-19 Vaccine (1) Never done   TETANUS/TDAP  Never done   COLONOSCOPY (Pts [id number] Insurance coverage will need to be confirmed)  Never done   Zoster Vaccines- Shingrix (1 of 2) Never done   INFLUENZA VACCINE  04/25/2022    Colorectal cancer screening: Referral to GI placed  . Pt aware the office will call re: appt.  Lung Cancer Screening: (Low Dose CT Chest recommended if Age 45-80 years, 30 pack-year currently smoking OR have quit w/in 15years.) does qualify.   Lung Cancer Screening Referral: NO  Additional Screening:  Hepatitis C Screening: does qualify; Completed 04/17/22  Vision Screening: Recommended annual ophthalmology exams for early detection of glaucoma and other disorders of the eye. Is the patient up to date with their annual eye exam?  No  Who is the provider or what is the name of the office in which the patient attends annual eye exams? N/a If pt is not established with a provider, would they like to be referred to a provider to establish care? No .   Dental Screening: Recommended annual dental exams for proper oral hygiene  Community Resource Referral / Chronic Care  Management: CRR required this visit?  No   CCM required this visit?  No      Plan:     I have personally reviewed and noted the following in the patient's chart:   Medical and social history Use of alcohol, tobacco or illicit drugs  Current medications and supplements including opioid prescriptions. Patient is not currently taking opioid prescriptions. Functional ability and status Nutritional status Physical activity Advanced directives List of other physicians Hospitalizations, surgeries, and ER visits in previous 12 months Vitals Screenings to include cognitive, depression, and falls Referrals and appointments  In addition, I have reviewed and discussed with patient certain preventive protocols, quality metrics, and best practice recommendations. A written personalized care plan for preventive services as well as general preventive health recommendations were provided to patient.     Jasper RilingKatie N Zohar Laing, [place]MA   06/15/2022

## 2022-06-15 NOTE — Patient Instructions (Signed)
  Mr. Tabora , Thank you for taking time to come for your Medicare Wellness Visit. I appreciate your ongoing commitment to your health goals. Please review the following plan we discussed and let me know if I can assist you in the future.   These are the goals we discussed:  Goals      Patient Stated     Get better so he can do more.        This is a list of the screening recommended for you and due dates:  Health Maintenance  Topic Date Due   COVID-19 Vaccine (1) Never done   Tetanus Vaccine  Never done   Colon Cancer Screening  Never done   Zoster (Shingles) Vaccine (1 of 2) Never done   Flu Shot  04/25/2022   Pneumonia Vaccine  Completed   Hepatitis C Screening: USPSTF Recommendation to screen - Ages 4-79 yo.  Completed   HPV Vaccine  Aged Out

## 2022-06-20 ENCOUNTER — Other Ambulatory Visit: Payer: Self-pay | Admitting: Internal Medicine

## 2022-06-20 ENCOUNTER — Other Ambulatory Visit: Payer: Self-pay | Admitting: Emergency Medicine

## 2022-07-15 ENCOUNTER — Other Ambulatory Visit: Payer: Self-pay | Admitting: Emergency Medicine

## 2022-08-02 ENCOUNTER — Other Ambulatory Visit: Payer: Self-pay | Admitting: Internal Medicine

## 2022-08-02 DIAGNOSIS — F419 Anxiety disorder, unspecified: Secondary | ICD-10-CM

## 2022-08-02 DIAGNOSIS — J019 Acute sinusitis, unspecified: Secondary | ICD-10-CM

## 2022-08-08 ENCOUNTER — Ambulatory Visit (INDEPENDENT_AMBULATORY_CARE_PROVIDER_SITE_OTHER): Payer: Medicare Other | Admitting: Acute Care

## 2022-08-08 ENCOUNTER — Encounter: Payer: Self-pay | Admitting: Acute Care

## 2022-08-08 VITALS — BP 130/80 | HR 86 | Temp 98.4°F | Ht 69.0 in | Wt 143.8 lb

## 2022-08-08 DIAGNOSIS — J9611 Chronic respiratory failure with hypoxia: Secondary | ICD-10-CM | POA: Diagnosis not present

## 2022-08-08 DIAGNOSIS — J449 Chronic obstructive pulmonary disease, unspecified: Secondary | ICD-10-CM | POA: Diagnosis not present

## 2022-08-08 DIAGNOSIS — R918 Other nonspecific abnormal finding of lung field: Secondary | ICD-10-CM | POA: Diagnosis not present

## 2022-08-08 DIAGNOSIS — Z720 Tobacco use: Secondary | ICD-10-CM

## 2022-08-08 DIAGNOSIS — F17210 Nicotine dependence, cigarettes, uncomplicated: Secondary | ICD-10-CM | POA: Diagnosis not present

## 2022-08-08 NOTE — Progress Notes (Signed)
History of Present Illness Jeffrey Krueger is a 66 y.o. male  with history of tobacco use (46 pack years) and associated COPD, chronic hypoxic respiratory failure on oxygen at 2 to 3 L/min. He had Covid 01/2021.He is followed by Dr. Delton Krueger. Maintenance >> Breztri Rescue >> albuterol frequently through the day  Still smoking about 4-5 cigarettes a day.    08/08/2022 Follow up for lung nodule. Pt was last seen by Dr. Delton Krueger 11/2021. Plan at that time was for 6 month follow up CT Chest to re-evaluate right upper lobe near apex nodule vs scarring. Marland Kitchen He had a repeat scan 04/2022, which shows stable appearance of the area. We will do a repeat scan in 6 months to continue surveillance.   He does still smoke 4 cigarettes daily. He is wearing a Patch. I have encouraged him to quit, as he is wearing oxygen at 3 L continuously. He is compliant with his Breztri 2 puffs twice daily.Continue using your rescue inhaler as needed for shortness of breath or wheezing. He is using this about 6 times daily. I have told him this is too much, and that he should use no more than 4 times a day.  He is coughing up thick yellow secretions. He has had a morning headache. I have asked him to be evaluated for sleep apnea, and he does not want to do this. We discussed the risks of untreated sleep apnea, he verbalized understanding.. I have also counseled him  to maintain sats at 90%. He is currently getting his sats up to 99% on his oxygen. . I have told him this is too high for his COPD, and that when he is at rest he should turn his oxygen down to maintain sats at 88-92%. He verbalized understanding  Test Results: CT chest  05/02/2022 Stable appearance of nodular architectural distortion in the posterior aspect of the right upper lobe near the apex, considered most likely to represent an area of chronic post infectious or inflammatory scarring. Repeat noncontrast chest CT is recommended in 12 months to ensure continued  stability. 2. Diffuse bronchial wall thickening with severe centrilobular and paraseptal emphysema; imaging findings suggestive of underlying COPD. 3. Aortic atherosclerosis.  CT chest 03/24/2021   shows large-scale clearance of the right upper lobe infiltrate seen on his CT chest 11/26/2020, there is a residual 12 mm nodule left in that area, question scar.  Some branching nodular thickening left lower lobe is unchanged compared with prior.  He has extensive emphysema.        Latest Ref Rng & Units 04/17/2022    4:20 PM 07/07/2016   11:00 PM 09/20/2013    6:05 PM  CBC  WBC 3.4 - 10.8 x10E3/uL 14.9  23.3  8.6   Hemoglobin 13.0 - 17.7 g/dL 62.7  03.5  00.9   Hematocrit 37.5 - 51.0 % 41.4  44.2  44.3   Platelets 150 - 450 x10E3/uL 329  216  209        Latest Ref Rng & Units 04/17/2022    4:20 PM 07/07/2016   11:00 PM 11/29/2015    5:00 PM  BMP  Glucose 70 - 99 mg/dL 92  381  79   BUN 8 - 27 mg/dL 18  26  14    Creatinine 0.76 - 1.27 mg/dL  8.29  9.37   BUN/Creat Ratio 10 - 24 20   14    Sodium 134 - 144 mmol/L 140  138  144  Potassium mmol/L CANCELED  3.9  3.7   Chloride 96 - 106 mmol/L 102  104  100   CO2 20 - 29 mmol/L 24  24  26    Calcium 8.6 - 10.2 mg/dL 9.1  8.9  9.2     BNP    Component Value Date/Time   BNP 39.8 07/08/2016 0055    ProBNP No results found for: "PROBNP"  PFT No results found for: "FEV1PRE", "FEV1POST", "FVCPRE", "FVCPOST", "TLC", "DLCOUNC", "PREFEV1FVCRT", "PSTFEV1FVCRT"  No results found.   Past medical hx Past Medical History:  Diagnosis Date   COPD (chronic obstructive pulmonary disease) ([place])    Headache(784.0)    Shortness of breath      Social History   Tobacco Use   Smoking status: Every Day    Packs/day: 0.50    Years: 44.00    Total pack years: 22.00    Types: Cigarettes    Start date: 02/06/1974   Smokeless tobacco: Never   Tobacco comments:     10 cigarettes a day ARJ 12/02/21  Substance Use Topics   Alcohol use: No     Alcohol/week: 0.0 standard drinks of alcohol   Drug use: No    Mr.Jeffrey Krueger reports that he has been smoking cigarettes. He started smoking about 48 years ago. He has a 22.00 pack-year smoking history. He has never used smokeless tobacco. He reports that he does not drink alcohol and does not use drugs.  Tobacco Cessation: Current every day smoker with a 46+ pack year smoking history  Past surgical hx, Family hx, Social hx all reviewed.  Current Outpatient Medications on File Prior to Visit  Medication Sig   albuterol (PROVENTIL) (2.5 MG/3ML) 0.083% nebulizer solution INHALE CONTENTS OF 1 VIAL IN NEBULIZER EVERY 4 HOURS IF NEEDED FOR WHEEZING   albuterol (VENTOLIN HFA) 108 (90 Base) MCG/ACT inhaler INHALE 2 PUFFS BY MOUTH EVERY 4 HOURS AS NEEDED   Budeson-Glycopyrrol-Formoterol (BREZTRI AEROSPHERE) 160-9-4.8 MCG/ACT AERO Inhale 2 puffs into the lungs in the morning and at bedtime.   buPROPion (WELLBUTRIN SR) 200 MG 12 hr tablet TAKE 1 TABLET BY MOUTH TWICE A DAY   fluticasone (FLONASE) 50 MCG/ACT nasal spray SPRAY 2 SPRAYS INTO EACH NOSTRIL EVERY DAY   Olopatadine HCl 0.2 % SOLN Apply 1 drop to eye daily.   omeprazole (PRILOSEC) 40 MG capsule TAKE 1 CAPSULE (40 MG TOTAL) BY MOUTH DAILY.   predniSONE (DELTASONE) 20 MG tablet TAKE 1 TABLET BY MOUTH EVERY DAY WITH BREAKFAST   predniSONE (DELTASONE) 5 MG tablet TAKE 3 TABLETS (15 MG TOTAL) BY MOUTH DAILY WITH BREAKFAST   Respiratory Therapy Supplies (NEBULIZER AIR TUBE/PLUGS) MISC For Albuterol nebulization.   Respiratory Therapy Supplies (NEBULIZER MASK ADULT) MISC For Albuterol nebulization.   traZODone (DESYREL) 150 MG tablet Take 1 tablet (150 mg total) by mouth at bedtime.   Vitamin D, Ergocalciferol, (DRISDOL) 1.25 MG (50000 UNIT) CAPS capsule Take 1 capsule (50,000 Units total) by mouth every 7 (seven) days.   No current facility-administered medications on file prior to visit.     No Known Allergies  Review Of  Systems:  Constitutional:   No  weight loss, night sweats,  Fevers, chills, +fatigue, or  lassitude.  HEENT:   + headaches,  Difficulty swallowing,  Tooth/dental problems, or  Sore throat,                No sneezing, itching, ear ache, nasal congestion, post nasal drip,   CV:  No chest pain,  Orthopnea, PND,  swelling in lower extremities, anasarca, dizziness, palpitations, syncope.   GI  No heartburn, indigestion, abdominal pain, nausea, vomiting, diarrhea, change in bowel habits, loss of appetite, bloody stools.   Resp: + shortness of breath with exertion less at rest.  + excess mucus, + productive cough,  No non-productive cough,  No coughing up of blood.  No change in color of mucus.  + wheezing.  No chest wall deformity  Skin: no rash or lesions.  GU: no dysuria, change in color of urine, no urgency or frequency.  No flank pain, no hematuria   MS:  No joint pain or swelling.  + decreased range of motion.  No back pain.  Psych:  No change in mood or affect. No depression or anxiety.  No memory loss.   Vital Signs BP 130/80   Pulse 86   Temp 98.4 F (36.9 C) (Oral)   Ht 5\' 9"  (1.753 m)   Wt 143 lb 12.8 oz (65.2 kg)   SpO2 99%   BMI 21.24 kg/m    Physical Exam:  General- No distress,  A&Ox3, pleasant ENT: No sinus tenderness, TM clear, pale nasal mucosa, no oral exudate,no post nasal drip, no LAN Cardiac: S1, S2, regular rate and rhythm, no murmur Chest: + wheeze/ No rales/ dullness; no accessory muscle use, no nasal flaring, no sternal retractions, + rhonchi, decreased per bases and distant Abd.: Soft Non-tender, ND, BS +, Body mass index is 21.24 kg/m.  Ext: No clubbing cyanosis, edema Neuro:  physically deconditioned, MAE x 4, A&O x 3,  Skin: No rashes, warm and dry, no lesions  Psych: normal mood and behavior   Assessment/Plan  Severe COPD Nodular architectural distortion in the posterior aspect of the right upper lobe near the apex,  Chronic Respiratory  Failure Current every day smoker Plan We will do a 6 month follow up CT Chest.  This will be due in 10/2022.  Follow up with MD at [facility] office after scan.  Continue Breztri 2 puffs twice a day.  Rinse and gargle after using. Keep albuterol available to use 2 puffs or 1 nebulizer treatment up to every 4 hours if needed for shortness of breath, chest tightness, wheezing. Continue prednisone 15 mg daily. Continue your oxygen at 3 L/min.   Saturation goals are 88-92% Please work on quitting smoking entirely Consider getting evaluated for sleep apnea. Call if you change your mind. Remember to decrease the dose of anti-anxiety if it is making you sleepy.  Call if you need to be seen sooner . Note your daily symptoms > remember "red flags" for COPD:  Increase in cough, increase in sputum production, increase in shortness of breath or activity intolerance. If you notice these symptoms, please call to be seen.    Please contact office for sooner follow up if symptoms do not improve or worsen or seek emergency care    I spent 40 minutes dedicated to the care of this patient on the date of this encounter to include pre-visit review of records, face-to-face time with the patient discussing conditions above, post visit ordering of testing, clinical documentation with the electronic health record, making appropriate referrals as documented, and communicating necessary information to the patient's healthcare team.    Magdalen Spatz, NP 08/08/2022  11:01 AM

## 2022-08-08 NOTE — Patient Instructions (Addendum)
It is good to see you today. We will do a 6 month follow up CT Chest.  This will be due in 10/2022.  Follow up with MD at [facility] office after scan.  Continue Breztri 2 puffs twice a day.  Rinse and gargle after using. Keep albuterol available to use 2 puffs or 1 nebulizer treatment up to every 4 hours if needed for shortness of breath, chest tightness, wheezing. Continue prednisone 15 mg daily. Continue your oxygen at 3 L/min.   Saturation goals are 88-92% Please work on quitting smoking entirely Consider getting evaluated for sleep apnea. Call if you change your mind. Remember to decrease the dose of anti-anxiety if it is making you sleepy.  Call if you need to be seen sooner . Note your daily symptoms > remember "red flags" for COPD:  Increase in cough, increase in sputum production, increase in shortness of breath or activity intolerance. If you notice these symptoms, please call to be seen.    Please contact office for sooner follow up if symptoms do not improve or worsen or seek emergency care

## 2022-08-16 ENCOUNTER — Other Ambulatory Visit: Payer: Self-pay | Admitting: Internal Medicine

## 2022-08-16 DIAGNOSIS — E559 Vitamin D deficiency, unspecified: Secondary | ICD-10-CM

## 2022-08-30 ENCOUNTER — Other Ambulatory Visit: Payer: Self-pay | Admitting: Emergency Medicine

## 2022-09-26 ENCOUNTER — Other Ambulatory Visit: Payer: Self-pay | Admitting: Internal Medicine

## 2022-09-26 ENCOUNTER — Other Ambulatory Visit: Payer: Self-pay | Admitting: Emergency Medicine

## 2022-09-26 DIAGNOSIS — J449 Chronic obstructive pulmonary disease, unspecified: Secondary | ICD-10-CM

## 2022-09-26 DIAGNOSIS — F419 Anxiety disorder, unspecified: Secondary | ICD-10-CM

## 2022-10-11 ENCOUNTER — Other Ambulatory Visit: Payer: Self-pay | Admitting: Internal Medicine

## 2022-10-11 ENCOUNTER — Telehealth: Payer: Self-pay | Admitting: Internal Medicine

## 2022-10-11 DIAGNOSIS — E559 Vitamin D deficiency, unspecified: Secondary | ICD-10-CM

## 2022-10-11 MED ORDER — VITAMIN D (ERGOCALCIFEROL) 1.25 MG (50000 UNIT) PO CAPS: 50000.0000 [IU] | ORAL_CAPSULE | ORAL | 1 refills | Status: DC

## 2022-10-11 NOTE — Telephone Encounter (Signed)
Prescription Request  10/11/2022  Is this a "Controlled Substance" medicine? No  LOV: 04/17/2022  What is the name of the medication or equipment? Vitamin D, Ergocalciferol, (DRISDOL) 1.25 MG (50000 UNIT) CAPS capsule   Have you contacted your pharmacy to request a refill? Yes   Which pharmacy would you like this sent to?  [place], [place] - [address] [place] [address] Phone: [phone] Fax: [phone]  [id number] - [place], [place] - [address] [place] [address] Phone: [phone] Fax: [phone]  [id number] [place], [place] [address] [place] [address] Phone: [phone] Fax: [phone]    Patient notified that their request is being sent to the clinical staff for review and that they should receive a response within 2 business days.   Please advise at [facility] [phone]

## 2022-10-16 ENCOUNTER — Other Ambulatory Visit: Payer: Self-pay | Admitting: Internal Medicine

## 2022-10-16 DIAGNOSIS — J449 Chronic obstructive pulmonary disease, unspecified: Secondary | ICD-10-CM

## 2022-10-16 MED ORDER — ALBUTEROL SULFATE HFA 108 (90 BASE) MCG/ACT IN AERS: INHALATION_SPRAY | RESPIRATORY_TRACT | 0 refills | Status: DC

## 2022-10-18 ENCOUNTER — Ambulatory Visit (INDEPENDENT_AMBULATORY_CARE_PROVIDER_SITE_OTHER): Payer: Medicare Other | Admitting: Internal Medicine

## 2022-10-18 ENCOUNTER — Encounter: Payer: Self-pay | Admitting: Internal Medicine

## 2022-10-18 VITALS — BP 132/64 | HR 106 | Ht 69.0 in | Wt 137.4 lb

## 2022-10-18 DIAGNOSIS — F17210 Nicotine dependence, cigarettes, uncomplicated: Secondary | ICD-10-CM

## 2022-10-18 DIAGNOSIS — Z23 Encounter for immunization: Secondary | ICD-10-CM

## 2022-10-18 DIAGNOSIS — F419 Anxiety disorder, unspecified: Secondary | ICD-10-CM

## 2022-10-18 DIAGNOSIS — J9611 Chronic respiratory failure with hypoxia: Secondary | ICD-10-CM

## 2022-10-18 DIAGNOSIS — J449 Chronic obstructive pulmonary disease, unspecified: Secondary | ICD-10-CM

## 2022-10-18 DIAGNOSIS — R911 Solitary pulmonary nodule: Secondary | ICD-10-CM

## 2022-10-18 DIAGNOSIS — K2950 Unspecified chronic gastritis without bleeding: Secondary | ICD-10-CM

## 2022-10-18 DIAGNOSIS — Z720 Tobacco use: Secondary | ICD-10-CM

## 2022-10-18 MED ORDER — OMEPRAZOLE 40 MG PO CPDR: 40.0000 mg | DELAYED_RELEASE_CAPSULE | Freq: Every day | ORAL | 3 refills | Status: DC

## 2022-10-18 NOTE — Assessment & Plan Note (Signed)
Has been 2-3 l O2 at home since hospitalization for COPD exacerbation in 08/2020 Underlying COPD and continuous tobacco abuse Albuterol nebulizer PRN, supplies sent  From 05/2021: Patient Saturations on Room Air at Rest = 96%   Patient Saturations on Room Air while Ambulating = 88%   Patient Saturations on 3 Liters of oxygen via POC while Ambulating = 93%

## 2022-10-18 NOTE — Assessment & Plan Note (Signed)
Overall stable On Breztri and as needed albuterol Also uses albuterol neb On oral steroids now Followed by Pulmonology

## 2022-10-18 NOTE — Patient Instructions (Addendum)
Please continue taking medications as prescribed.  Please start taking Omeprazole as prescribed.  Please consider getting Shingrix and Tdap vaccines at local pharmacy.

## 2022-10-18 NOTE — Assessment & Plan Note (Signed)
Gets surveillance CT chest, followed by Pulmonology

## 2022-10-18 NOTE — Assessment & Plan Note (Signed)
Smokes 0.25 pack/day currently  Asked about quitting: confirms that he currently smokes cigarettes Advise to quit smoking: Educated about QUITTING to reduce the risk of cancer, cardio and cerebrovascular disease. Assess willingness: Unwilling to quit at this time, but is working on cutting back. Assist with counseling and pharmacotherapy: Counseled for 5 minutes and literature provided. On Wellbutrin. Arrange for follow up: follow up in 3 months and continue to offer help.  Advised for staying away from cigarettes while on home oxygen.

## 2022-10-18 NOTE — Assessment & Plan Note (Signed)
On omeprazole for gastritis PPx as he is on chronic oral steroids for COPD

## 2022-10-18 NOTE — Assessment & Plan Note (Signed)
Overall well-controlled with Wellbutrin 200 mg twice daily Continue trazodone 150 mg nightly for insomnia Avoid increasing dose of Wellbutrin or adding any anxiolytic to avoid drowsiness

## 2022-10-18 NOTE — Progress Notes (Signed)
Established Patient Office Visit  Subjective:  Patient ID: Jeffrey Krueger, male    DOB: 01-13-1956  Age: 67 y.o. MRN: [id number]  CC:  Chief Complaint  Patient presents with   COPD    Six month follow up for COPD    HPI Jeffrey Krueger is a 67 y.o. male with past medical history of COPD, hypoxic respiratory failure, anxiety and tobacco abuse who presents for f/u of his chronic medical conditions.  He has been using Breztri and as needed albuterol for COPD.  He follows up with Pulmonology for COPD and chronic hypoxic respiratory failure. He has continuous home O2 for it now.  He still smokes about 0.5 pack/day.  He is on oral prednisone for chronic hypoxia.  He complains of persistent anxiety despite taking Wellbutrin.  He denies any anhedonia.  He has anxiety at nighttime when he has to use O2 while sleeping.  He takes trazodone for insomnia as well.  He denies any SI or HI currently.     Past Medical History:  Diagnosis Date   COPD (chronic obstructive pulmonary disease) (HCC)    Headache(784.0)    Shortness of breath     Past Surgical History:  Procedure Laterality Date   BACK SURGERY      History reviewed. No pertinent family history.  Social History   Socioeconomic History   Marital status: Married    Spouse name: Not on file   Number of children: Not on file   Years of education: Not on file   Highest education level: Not on file  Occupational History   Not on file  Tobacco Use   Smoking status: Every Day    Packs/day: 0.50    Years: 44.00    Total pack years: 22.00    Types: Cigarettes    Start date: 02/06/1974   Smokeless tobacco: Never   Tobacco comments:     10 cigarettes a day ARJ 12/02/21  Substance and Sexual Activity   Alcohol use: No    Alcohol/week: 0.0 standard drinks of alcohol   Drug use: No   Sexual activity: Not on file  Other Topics Concern   Not on file  Social History Narrative   Not on file   Social Determinants of Health    Financial Resource Strain: Low Risk  (06/15/2022)   Overall Financial Resource Strain (CARDIA)    Difficulty of Paying Living Expenses: Not hard at all  Food Insecurity: No Food Insecurity (06/15/2022)   Hunger Vital Sign    Worried About Running Out of Food in the Last Year: Never true    Ran Out of Food in the Last Year: Never true  Transportation Needs: No Transportation Needs (06/15/2022)   PRAPARE - Hydrologist (Medical): No    Lack of Transportation (Non-Medical): No  Physical Activity: Inactive (06/15/2022)   Exercise Vital Sign    Days of Exercise per Week: 0 days    Minutes of Exercise per Session: 0 min  Stress: No Stress Concern Present (06/15/2022)   [place]    Feeling of Stress : Not at all  Social Connections: Socially Isolated (06/15/2022)   Social Connection and Isolation Panel [NHANES]    Frequency of Communication with Friends and Family: Once a week    Frequency of Social Gatherings with Friends and Family: Never    Attends Religious Services: Never    Marine scientist or Organizations:  No    Attends Club or Organization Meetings: Never    Marital Status: Married  Human resources officer Violence: Not At Risk (06/15/2022)   Humiliation, Afraid, Rape, and Kick questionnaire    Fear of Current or Ex-Partner: No    Emotionally Abused: No    Physically Abused: No    Sexually Abused: No    Outpatient Medications Prior to Visit  Medication Sig Dispense Refill   albuterol (PROVENTIL) (2.5 MG/3ML) 0.083% nebulizer solution INHALE CONTENTS OF 1 VIAL IN NEBULIZER EVERY 4 HOURS IF NEEDED FOR WHEEZING 450 mL 3   albuterol (VENTOLIN HFA) 108 (90 Base) MCG/ACT inhaler INHALE 2 PUFFS BY MOUTH EVERY 4 HOURS AS NEEDED 18 each 0   Budeson-Glycopyrrol-Formoterol (BREZTRI AEROSPHERE) 160-9-4.8 MCG/ACT AERO Inhale 2 puffs into the lungs in the morning and at bedtime. 10.7 g 11    buPROPion (WELLBUTRIN SR) 200 MG 12 hr tablet TAKE 1 TABLET BY MOUTH TWICE A DAY 180 tablet 1   fluticasone (FLONASE) 50 MCG/ACT nasal spray SPRAY 2 SPRAYS INTO EACH NOSTRIL EVERY DAY 48 mL 0   Olopatadine HCl 0.2 % SOLN Apply 1 drop to eye daily. 2.5 mL 0   predniSONE (DELTASONE) 20 MG tablet TAKE 1 TABLET BY MOUTH EVERY DAY WITH BREAKFAST 30 tablet 0   predniSONE (DELTASONE) 5 MG tablet TAKE 3 TABLETS (15 MG TOTAL) BY MOUTH DAILY WITH BREAKFAST 120 tablet 2   Respiratory Therapy Supplies (NEBULIZER AIR TUBE/PLUGS) MISC For Albuterol nebulization. 1 each 0   Respiratory Therapy Supplies (NEBULIZER MASK ADULT) MISC For Albuterol nebulization. 1 each 0   traZODone (DESYREL) 150 MG tablet TAKE 1 TABLET BY MOUTH AT BEDTIME. 90 tablet 1   Vitamin D, Ergocalciferol, (DRISDOL) 1.25 MG (50000 UNIT) CAPS capsule Take 1 capsule (50,000 Units total) by mouth every 7 (seven) days. 12 capsule 1   omeprazole (PRILOSEC) 40 MG capsule TAKE 1 CAPSULE (40 MG TOTAL) BY MOUTH DAILY. 90 capsule 1   No facility-administered medications prior to visit.    No Known Allergies  ROS Review of Systems  Constitutional:  Positive for fatigue. Negative for chills and fever.  HENT:  Negative for congestion and sore throat.   Eyes:  Negative for pain and discharge.  Respiratory:  Positive for cough, shortness of breath and wheezing.   Cardiovascular:  Negative for chest pain and palpitations.  Gastrointestinal:  Negative for constipation, diarrhea, nausea and vomiting.  Endocrine: Negative for polydipsia and polyuria.  Genitourinary:  Negative for dysuria and hematuria.  Musculoskeletal:  Negative for neck pain and neck stiffness.  Skin:  Negative for rash.  Neurological:  Negative for dizziness, weakness, numbness and headaches.  Psychiatric/Behavioral:  Positive for sleep disturbance. Negative for agitation and behavioral problems. The patient is nervous/anxious.       Objective:    Physical Exam Vitals  reviewed.  Constitutional:      General: He is not in acute distress.    Appearance: He is not diaphoretic.  HENT:     Head: Normocephalic and atraumatic.     Nose: Nose normal.     Mouth/Throat:     Mouth: Mucous membranes are moist.  Eyes:     General: No scleral icterus.    Extraocular Movements: Extraocular movements intact.  Cardiovascular:     Rate and Rhythm: Normal rate and regular rhythm.     Pulses: Normal pulses.     Heart sounds: Normal heart sounds. No murmur heard. Pulmonary:     Breath sounds: Wheezing (Mild) present.  No rales.     Comments: O2 - 3 lpm Musculoskeletal:     Cervical back: Neck supple. No tenderness.     Right lower leg: No edema.     Left lower leg: No edema.  Skin:    General: Skin is warm.     Findings: No rash.  Neurological:     General: No focal deficit present.     Mental Status: He is alert and oriented to person, place, and time.     Sensory: No sensory deficit.     Motor: Weakness (4/5 - b/l LE) present.  Psychiatric:        Mood and Affect: Mood normal.        Behavior: Behavior normal.     BP 132/64 (BP Location: Left Arm, Cuff Size: Normal)   Pulse (!) 106   Ht 5\' 9"  (1.753 m)   Wt 137 lb 6.4 oz (62.3 kg)   SpO2 97%   BMI 20.29 kg/m  Wt Readings from Last 3 Encounters:  10/18/22 137 lb 6.4 oz (62.3 kg)  08/08/22 143 lb 12.8 oz (65.2 kg)  04/17/22 140 lb 6.4 oz (63.7 kg)    Lab Results  Component Value Date   TSH 1.340 04/17/2022   Lab Results  Component Value Date   WBC 14.9 (H) 04/17/2022   HGB 13.8 04/17/2022   HCT 41.4 04/17/2022   MCV 95 04/17/2022   PLT 329 04/17/2022   Lab Results  Component Value Date   NA 140 04/17/2022   K CANCELED 04/17/2022   CO2 24 04/17/2022   GLUCOSE 92 04/17/2022   BUN 18 04/17/2022   CREATININE 0.90 04/17/2022   BILITOT 0.7 04/17/2022   ALKPHOS 108 04/17/2022   AST 30 04/17/2022   ALT 34 04/17/2022   PROT 6.0 04/17/2022   ALBUMIN 3.7 (L) 04/17/2022   CALCIUM 9.1  04/17/2022   ANIONGAP 10 07/07/2016   EGFR 94 04/17/2022   Lab Results  Component Value Date   CHOL 157 04/17/2022   Lab Results  Component Value Date   HDL 46 04/17/2022   Lab Results  Component Value Date   LDLCALC 92 04/17/2022   Lab Results  Component Value Date   TRIG 106 04/17/2022   Lab Results  Component Value Date   CHOLHDL 3.4 04/17/2022   Lab Results  Component Value Date   HGBA1C 5.2 04/17/2022      Assessment & Plan:   Problem List Items Addressed This Visit       Respiratory   Lung nodule    Gets surveillance CT chest, followed by Pulmonology      COPD (chronic obstructive pulmonary disease) ([place])    Overall stable On Breztri and as needed albuterol Also uses albuterol neb On oral steroids now Followed by Pulmonology      Chronic respiratory failure with hypoxia ([place]) - Primary    Has been 2-3 l O2 at home since hospitalization for COPD exacerbation in 08/2020 Underlying COPD and continuous tobacco abuse Albuterol nebulizer PRN, supplies sent  From 05/2021: Patient Saturations on Room Air at Rest = 96%   Patient Saturations on Room Air while Ambulating = 88%   Patient Saturations on 3 Liters of oxygen via POC while Ambulating = 93%        Digestive   Chronic gastritis without bleeding    On omeprazole for gastritis PPx as he is on chronic oral steroids for COPD      Relevant Medications  omeprazole (PRILOSEC) 40 MG capsule     Other   Tobacco abuse    Smokes 0.25 pack/day currently  Asked about quitting: confirms that he currently smokes cigarettes Advise to quit smoking: Educated about QUITTING to reduce the risk of cancer, cardio and cerebrovascular disease. Assess willingness: Unwilling to quit at this time, but is working on cutting back. Assist with counseling and pharmacotherapy: Counseled for 5 minutes and literature provided. On Wellbutrin. Arrange for follow up: follow up in 3 months and continue to offer  help.  Advised for staying away from cigarettes while on home oxygen.      Anxiety    Overall well-controlled with Wellbutrin 200 mg twice daily Continue trazodone 150 mg nightly for insomnia Avoid increasing dose of Wellbutrin or adding any anxiolytic to avoid drowsiness      Other Visit Diagnoses     Need for immunization against influenza       Relevant Orders   Flu Vaccine QUAD High Dose(Fluad) (Completed)       Meds ordered this encounter  Medications   omeprazole (PRILOSEC) 40 MG capsule    Sig: Take 1 capsule (40 mg total) by mouth daily.    Dispense:  90 capsule    Refill:  3    Follow-up: Return in about 6 months (around 04/18/2023).    Anabel Halon, MD

## 2022-10-23 ENCOUNTER — Other Ambulatory Visit: Payer: Self-pay | Admitting: Emergency Medicine

## 2022-10-23 ENCOUNTER — Other Ambulatory Visit: Payer: Self-pay | Admitting: Internal Medicine

## 2022-10-23 DIAGNOSIS — J209 Acute bronchitis, unspecified: Secondary | ICD-10-CM

## 2022-10-23 DIAGNOSIS — J019 Acute sinusitis, unspecified: Secondary | ICD-10-CM

## 2022-10-25 MED ORDER — PREDNISONE 5 MG PO TABS: 15.0000 mg | ORAL_TABLET | Freq: Every day | ORAL | 2 refills | Status: DC

## 2022-10-25 NOTE — Telephone Encounter (Signed)
Ok refill Prednisone 15 mg per chart.

## 2022-10-31 ENCOUNTER — Other Ambulatory Visit: Payer: Self-pay | Admitting: Emergency Medicine

## 2022-11-02 ENCOUNTER — Telehealth: Payer: Self-pay | Admitting: Internal Medicine

## 2022-11-02 ENCOUNTER — Ambulatory Visit (HOSPITAL_COMMUNITY)
Admission: RE | Admit: 2022-11-02 | Discharge: 2022-11-02 | Disposition: A | Discharge status: Home / Self Care | Payer: Medicare Other | Source: Ambulatory Visit | Attending: Internal Medicine | Admitting: Internal Medicine

## 2022-11-02 DIAGNOSIS — R918 Other nonspecific abnormal finding of lung field: Secondary | ICD-10-CM | POA: Insufficient documentation

## 2022-11-02 DIAGNOSIS — R911 Solitary pulmonary nodule: Secondary | ICD-10-CM | POA: Diagnosis not present

## 2022-11-02 DIAGNOSIS — J439 Emphysema, unspecified: Secondary | ICD-10-CM | POA: Diagnosis not present

## 2022-11-02 NOTE — Telephone Encounter (Signed)
Disability claim form  Copied Noted Sleeved  Fax when completed and call and patient to pick up a copy

## 2022-11-06 DIAGNOSIS — Z0279 Encounter for issue of other medical certificate: Secondary | ICD-10-CM

## 2022-11-09 ENCOUNTER — Ambulatory Visit: Payer: Medicare Other | Admitting: Emergency Medicine

## 2022-11-09 NOTE — Telephone Encounter (Signed)
Forms faxed and called patient to pick up a copy

## 2022-11-23 ENCOUNTER — Other Ambulatory Visit: Payer: Self-pay | Admitting: Emergency Medicine

## 2022-11-23 ENCOUNTER — Other Ambulatory Visit: Payer: Self-pay | Admitting: Internal Medicine

## 2022-11-23 DIAGNOSIS — J449 Chronic obstructive pulmonary disease, unspecified: Secondary | ICD-10-CM

## 2022-12-16 ENCOUNTER — Other Ambulatory Visit: Payer: Self-pay | Admitting: Internal Medicine

## 2022-12-16 DIAGNOSIS — J449 Chronic obstructive pulmonary disease, unspecified: Secondary | ICD-10-CM

## 2022-12-19 ENCOUNTER — Ambulatory Visit (INDEPENDENT_AMBULATORY_CARE_PROVIDER_SITE_OTHER): Payer: Medicare Other | Admitting: Emergency Medicine

## 2022-12-19 ENCOUNTER — Encounter: Payer: Self-pay | Admitting: Emergency Medicine

## 2022-12-19 ENCOUNTER — Other Ambulatory Visit: Payer: Self-pay | Admitting: Emergency Medicine

## 2022-12-19 VITALS — BP 148/76 | HR 97 | Temp 98.3°F | Wt 138.4 lb

## 2022-12-19 DIAGNOSIS — Z720 Tobacco use: Secondary | ICD-10-CM | POA: Diagnosis not present

## 2022-12-19 DIAGNOSIS — J9611 Chronic respiratory failure with hypoxia: Secondary | ICD-10-CM

## 2022-12-19 DIAGNOSIS — J449 Chronic obstructive pulmonary disease, unspecified: Secondary | ICD-10-CM | POA: Diagnosis not present

## 2022-12-19 NOTE — Addendum Note (Signed)
Addended by: Dierdre Highman on: 12/19/2022 03:45 PM   Modules accepted: Orders

## 2022-12-19 NOTE — Patient Instructions (Addendum)
Continue Breztri 2 puffs twice a day.  Rinse and gargle after using. Keep albuterol available to use 2 puffs or 1 nebulizer treatment up to every 4 hours if needed for shortness of breath, chest tightness, wheezing.  Continue to use your prednisone 20 mg daily as needed for flaring symptoms.  We may decide to change you to a lower steady dose of prednisone at some point in the future Continue your oxygen at 3 L/min continuous flow at all times. About smoking cessation today.  Work your way down to 5 cigarettes daily.  Once you get to 5 cigarettes daily then you can try to set a quit date and then stop on that date.  You can use nicotine patches, nicotine gum to help with cravings once you have quit.  Our goal is to be off cigarettes by your follow-up visit in 6 months Follow with Jeffrey Krueger in 6 months, sooner if you have any problems.

## 2022-12-19 NOTE — Progress Notes (Signed)
Subjective:    Patient ID: Jeffrey Krueger, male    DOB: [birth year], 67 y.o.   MRN: [id number]  HPI  ROV 12/02/21 --follow-up visit for 67 year old man with end-stage COPD, continued tobacco use, associated chronic hypoxemic respiratory failure on 2 to 3 L/min.  Currently managed on Breztri, uses albuterol approximately He had a right upper lobe pneumonia and we have been following resolution of residual nodular scar on serial CT scans of the chest.  I started him on prednisone 20 mg daily in January, decreased and now on 15 mg daily.  At his most recent visit with S. Elie Confer he confirmed that he would not want to be intubated if he had acute respiratory failure.  He would want all other aggressive interventions including ACLS or BiPAP.  Today he reports that he is feeling better since we started the steroids, less SOB. He is having some cough, less productive. Smoking 10 cig a day.  He is waiting for an [employer] portable oxygen concentrator, does not have this yet.  ROV 12/19/22 --67 year old gentleman with end-stage COPD and continued tobacco use.  He has chronic hypoxemic respiratory failure.  He has nodular scar on serial CT imaging following a right upper lobe pneumonia.  He is on chronic prednisone, but no longer takes every day, just prn - probably 2x a week.  Currently managed on Breztri, uses albuterol approximately 3-4x a day, nebs and HFA. On 3L/min at all times.    Review of Systems As per HPI     Objective:   Physical Exam Vitals:   12/19/22 1450 12/19/22 1452  BP: (!) 146/78 (!) 148/76  Pulse: 99 97  Temp:  98.3 F (36.8 C)  TempSrc:  Oral  SpO2: 97% 97%  Weight:  138 lb 6.4 oz (62.8 kg)   Gen: Pleasant, cachectic, in no distress,  normal affect  ENT: No lesions,  mouth clear,  oropharynx clear, no postnasal drip  Neck: No JVD, no stridor  Lungs: Mild accessory muscle use, no crackles. He does have B wheezes on normal breath and forced exp  Cardiovascular: RRR, heart  sounds normal, no murmur or gallops, no peripheral edema  Musculoskeletal: No deformities, no cyanosis or clubbing  Neuro: alert, awake, non focal  Skin: Warm, no lesions or rash      Assessment & Plan:  COPD (chronic obstructive pulmonary disease) (HCC) Continues to have daily symptoms, quite limited.  Unfortunately still smoking.  He is using his prednisone in a very atypical fashion, takes 20 mg but only as needed.  We did talk today about changing to a steady lower dose but he is hesitant to do so.  For now we will continue the same routine.  Continue Breztri 2 puffs twice a day.  Rinse and gargle after using. Keep albuterol available to use 2 puffs or 1 nebulizer treatment up to every 4 hours if needed for shortness of breath, chest tightness, wheezing.  Continue to use your prednisone 20 mg daily as needed for flaring symptoms.  We may decide to change you to a lower steady dose of prednisone at some point in the future Follow with Dr. Lamonte Sakai in 6 months, sooner if you have any problems.  Chronic respiratory failure with hypoxia (HCC) Continue your oxygen at 3 L/min continuous flow at all times.  Tobacco abuse About smoking cessation today.  Work your way down to 5 cigarettes daily.  Once you get to 5 cigarettes daily then you can try to set a  quit date and then stop on that date.  You can use nicotine patches, nicotine gum to help with cravings once you have quit.  Our goal is to be off cigarettes by your follow-up visit in 6 months     Baltazar Apo, MD, PhD 12/19/2022, 3:17 PM [facility] [phone] or if no answer before 7:00PM call [phone] For any issues after 7:00PM please call eLink [phone]

## 2022-12-19 NOTE — Assessment & Plan Note (Signed)
About smoking cessation today.  Work your way down to 5 cigarettes daily.  Once you get to 5 cigarettes daily then you can try to set a quit date and then stop on that date.  You can use nicotine patches, nicotine gum to help with cravings once you have quit.  Our goal is to be off cigarettes by your follow-up visit in 6 months

## 2022-12-19 NOTE — Assessment & Plan Note (Addendum)
Continues to have daily symptoms, quite limited.  Unfortunately still smoking.  He is using his prednisone in a very atypical fashion, takes 20 mg but only as needed.  We did talk today about changing to a steady lower dose but he is hesitant to do so.  For now we will continue the same routine.  Continue Breztri 2 puffs twice a day.  Rinse and gargle after using. Keep albuterol available to use 2 puffs or 1 nebulizer treatment up to every 4 hours if needed for shortness of breath, chest tightness, wheezing.  Continue to use your prednisone 20 mg daily as needed for flaring symptoms.  We may decide to change you to a lower steady dose of prednisone at some point in the future Follow with Dr. Lamonte Sakai in 6 months, sooner if you have any problems.

## 2022-12-19 NOTE — Assessment & Plan Note (Signed)
Continue your oxygen at 3 L/min continuous flow at all times.

## 2022-12-26 ENCOUNTER — Other Ambulatory Visit (HOSPITAL_COMMUNITY): Payer: Self-pay

## 2022-12-26 ENCOUNTER — Other Ambulatory Visit: Payer: Self-pay | Admitting: Internal Medicine

## 2022-12-26 DIAGNOSIS — J9611 Chronic respiratory failure with hypoxia: Secondary | ICD-10-CM

## 2022-12-26 MED ORDER — NEBULIZER MASK ADULT MISC
0 refills | Status: AC
  Filled 2022-12-26 – 2023-05-03 (×3): qty 1, fill #0

## 2022-12-28 ENCOUNTER — Telehealth: Payer: Self-pay | Admitting: Internal Medicine

## 2022-12-28 NOTE — Telephone Encounter (Signed)
Prescription Request  12/28/2022  LOV: 10/18/2022  What is the name of the medication or equipment? Vitamin D, Ergocalciferol, (DRISDOL) 1.25 MG (50000 UNIT) CAPS capsule   Have you contacted your pharmacy to request a refill? No   Which pharmacy would you like this sent to?  [place], [place] - [address] [place] [address] Phone: [phone] Fax: [phone]  [facility] - [place], [place] - [address] [place] [address] Phone: [phone] Fax: [phone]  [id number] [place], [place] [address] [place] [address] Phone: [phone] Fax: [phone]    Patient notified that their request is being sent to the clinical staff for review and that they should receive a response within 2 business days.   Please advise at [facility] [phone]

## 2023-01-01 ENCOUNTER — Other Ambulatory Visit: Payer: Self-pay | Admitting: Emergency Medicine

## 2023-01-25 ENCOUNTER — Other Ambulatory Visit (HOSPITAL_COMMUNITY): Payer: Self-pay

## 2023-01-30 ENCOUNTER — Telehealth: Payer: Self-pay | Admitting: Emergency Medicine

## 2023-01-30 DIAGNOSIS — J449 Chronic obstructive pulmonary disease, unspecified: Secondary | ICD-10-CM

## 2023-01-31 ENCOUNTER — Other Ambulatory Visit: Payer: Self-pay | Admitting: Internal Medicine

## 2023-01-31 ENCOUNTER — Telehealth: Payer: Self-pay | Admitting: Internal Medicine

## 2023-01-31 DIAGNOSIS — J449 Chronic obstructive pulmonary disease, unspecified: Secondary | ICD-10-CM

## 2023-01-31 DIAGNOSIS — F419 Anxiety disorder, unspecified: Secondary | ICD-10-CM

## 2023-01-31 MED ORDER — ALBUTEROL SULFATE HFA 108 (90 BASE) MCG/ACT IN AERS: INHALATION_SPRAY | RESPIRATORY_TRACT | 5 refills | Status: DC

## 2023-01-31 MED ORDER — BUPROPION HCL ER (SR) 200 MG PO TB12: 200.0000 mg | ORAL_TABLET | Freq: Two times a day (BID) | ORAL | 1 refills | Status: DC

## 2023-01-31 MED ORDER — ALBUTEROL SULFATE (2.5 MG/3ML) 0.083% IN NEBU: 2.5000 mg | INHALATION_SOLUTION | Freq: Four times a day (QID) | RESPIRATORY_TRACT | 3 refills | Status: DC | PRN

## 2023-01-31 NOTE — Telephone Encounter (Signed)
Patient called still waiting on the scooter prescription and detail information to [facility] Patient call back # [phone] patient has questions on the scooter.

## 2023-02-01 NOTE — Telephone Encounter (Signed)
Awaiting information from patient

## 2023-02-02 ENCOUNTER — Other Ambulatory Visit: Payer: Self-pay | Admitting: Internal Medicine

## 2023-02-02 DIAGNOSIS — K2950 Unspecified chronic gastritis without bleeding: Secondary | ICD-10-CM

## 2023-02-02 DIAGNOSIS — F419 Anxiety disorder, unspecified: Secondary | ICD-10-CM

## 2023-02-02 MED ORDER — OMEPRAZOLE 40 MG PO CPDR: 40.0000 mg | DELAYED_RELEASE_CAPSULE | Freq: Every day | ORAL | 3 refills | Status: DC

## 2023-02-02 MED ORDER — TRAZODONE HCL 150 MG PO TABS
150.0000 mg | ORAL_TABLET | Freq: Every day | ORAL | 1 refills | Status: DC
Start: 2023-02-02 — End: 2023-04-06

## 2023-02-05 ENCOUNTER — Other Ambulatory Visit: Payer: Self-pay | Admitting: Internal Medicine

## 2023-02-05 DIAGNOSIS — J449 Chronic obstructive pulmonary disease, unspecified: Secondary | ICD-10-CM

## 2023-02-07 MED ORDER — BREZTRI AEROSPHERE 160-9-4.8 MCG/ACT IN AERO
2.0000 | INHALATION_SPRAY | Freq: Two times a day (BID) | RESPIRATORY_TRACT | 1 refills | Status: DC
Start: 2023-02-07 — End: 2023-04-02

## 2023-02-07 NOTE — Telephone Encounter (Signed)
Last RX was sent to a local pharmacy. I called the patient to see if he wanted the RX to be sent to the local pharmacy again or the mail order pharmacy. He did not answer. Left message for him to call us back.

## 2023-02-07 NOTE — Telephone Encounter (Signed)
[facility] calling and have sent over fax's for Budeson-Glycopyrrol-Formoterol (BREZTRI AEROSPHERE) 160-9-4.8 MCG/ACT AERO [[id number]]   Please respond. TY.

## 2023-02-07 NOTE — Telephone Encounter (Signed)
PT ret call would like this to be sent to the mail order Pharm. TY.

## 2023-02-07 NOTE — Telephone Encounter (Signed)
RX has been sent to mail order pharmacy as requested.   Will close encounter.

## 2023-02-13 ENCOUNTER — Other Ambulatory Visit: Payer: Self-pay | Admitting: Internal Medicine

## 2023-02-13 DIAGNOSIS — J019 Acute sinusitis, unspecified: Secondary | ICD-10-CM

## 2023-02-22 ENCOUNTER — Telehealth: Payer: Self-pay | Admitting: Emergency Medicine

## 2023-02-22 NOTE — Telephone Encounter (Signed)
Pharm calling for refill of Pred 10MG .  Fax # is [phone]

## 2023-02-23 NOTE — Telephone Encounter (Signed)
Called in with info for scooter   [facility]  Phone# [phone] Fax# [phone]  2. [facility]  Phone# [phone] Fax# [phone]   Also will need note sent to housing office to have ramp built for scooter.

## 2023-02-24 ENCOUNTER — Other Ambulatory Visit: Payer: Self-pay | Admitting: Internal Medicine

## 2023-02-24 DIAGNOSIS — H1013 Acute atopic conjunctivitis, bilateral: Secondary | ICD-10-CM

## 2023-02-26 ENCOUNTER — Other Ambulatory Visit: Payer: Self-pay

## 2023-02-26 ENCOUNTER — Other Ambulatory Visit: Payer: Self-pay | Admitting: Internal Medicine

## 2023-02-26 ENCOUNTER — Other Ambulatory Visit (HOSPITAL_COMMUNITY): Payer: Self-pay

## 2023-02-26 DIAGNOSIS — J449 Chronic obstructive pulmonary disease, unspecified: Secondary | ICD-10-CM

## 2023-02-26 DIAGNOSIS — J9611 Chronic respiratory failure with hypoxia: Secondary | ICD-10-CM

## 2023-02-26 MED ORDER — PREDNISONE 10 MG PO TABS: ORAL_TABLET | ORAL | 0 refills | Status: DC

## 2023-02-26 MED ORDER — OLOPATADINE HCL 0.2 % OP SOLN
1.0000 [drp] | Freq: Every day | OPHTHALMIC | 0 refills | Status: DC
Start: 2023-02-26 — End: 2023-10-25
  Filled 2023-02-26: qty 2.5, 50d supply, fill #0
  Filled 2023-03-13: qty 2.5, 25d supply, fill #0

## 2023-02-26 NOTE — Telephone Encounter (Signed)
ATC X1 LVM for patient to call the office back. Please advise prednisone has been sent to mail order pharmacy

## 2023-02-27 ENCOUNTER — Other Ambulatory Visit: Payer: Self-pay

## 2023-02-27 ENCOUNTER — Encounter: Payer: Self-pay | Admitting: Pharmacist

## 2023-02-27 ENCOUNTER — Other Ambulatory Visit (HOSPITAL_COMMUNITY): Payer: Self-pay

## 2023-02-27 NOTE — Telephone Encounter (Signed)
Called patient will keep July appointment to discuss scooter then

## 2023-03-02 ENCOUNTER — Other Ambulatory Visit: Payer: Self-pay

## 2023-03-13 ENCOUNTER — Other Ambulatory Visit (HOSPITAL_COMMUNITY): Payer: Self-pay

## 2023-03-14 ENCOUNTER — Other Ambulatory Visit: Payer: Self-pay

## 2023-03-19 ENCOUNTER — Other Ambulatory Visit: Payer: Self-pay | Admitting: Emergency Medicine

## 2023-03-21 ENCOUNTER — Telehealth: Payer: Self-pay | Admitting: Internal Medicine

## 2023-03-21 ENCOUNTER — Other Ambulatory Visit: Payer: Self-pay | Admitting: Internal Medicine

## 2023-03-21 DIAGNOSIS — E559 Vitamin D deficiency, unspecified: Secondary | ICD-10-CM

## 2023-03-21 MED ORDER — VITAMIN D (ERGOCALCIFEROL) 1.25 MG (50000 UNIT) PO CAPS: 50000.0000 [IU] | ORAL_CAPSULE | ORAL | 1 refills | Status: DC

## 2023-03-21 NOTE — Telephone Encounter (Signed)
Left message for patient

## 2023-03-21 NOTE — Telephone Encounter (Signed)
Prescription Request  03/21/2023  LOV: 10/18/2022  What is the name of the medication or equipment? Vitamin D, Ergocalciferol, (DRISDOL) 1.25 MG (50000 UNIT) CAPS capsule [[id number]]   [facility]acy  [phone] [place], [place] - [address] [facility] [address] [place], [place] [address] Phone: [phone]  Fax: [phone] DEA #: [id number]    Have you contacted your pharmacy to request a refill? Yes   Which pharmacy would you like this sent to?      Patient notified that their request is being sent to the clinical staff for review and that they should receive a response within 2 business days.   Please advise at [facility] [phone]

## 2023-03-31 ENCOUNTER — Other Ambulatory Visit: Payer: Self-pay | Admitting: Internal Medicine

## 2023-03-31 DIAGNOSIS — J449 Chronic obstructive pulmonary disease, unspecified: Secondary | ICD-10-CM

## 2023-04-05 ENCOUNTER — Other Ambulatory Visit: Payer: Self-pay | Admitting: Emergency Medicine

## 2023-04-05 ENCOUNTER — Other Ambulatory Visit: Payer: Self-pay | Admitting: Internal Medicine

## 2023-04-05 DIAGNOSIS — J449 Chronic obstructive pulmonary disease, unspecified: Secondary | ICD-10-CM

## 2023-04-05 DIAGNOSIS — F419 Anxiety disorder, unspecified: Secondary | ICD-10-CM

## 2023-04-05 DIAGNOSIS — K2950 Unspecified chronic gastritis without bleeding: Secondary | ICD-10-CM

## 2023-04-06 ENCOUNTER — Other Ambulatory Visit: Payer: Self-pay | Admitting: Internal Medicine

## 2023-04-06 DIAGNOSIS — F419 Anxiety disorder, unspecified: Secondary | ICD-10-CM

## 2023-04-17 ENCOUNTER — Other Ambulatory Visit: Payer: Self-pay | Admitting: Internal Medicine

## 2023-04-17 DIAGNOSIS — E559 Vitamin D deficiency, unspecified: Secondary | ICD-10-CM

## 2023-04-19 ENCOUNTER — Ambulatory Visit (INDEPENDENT_AMBULATORY_CARE_PROVIDER_SITE_OTHER): Payer: Medicare HMO | Admitting: Internal Medicine

## 2023-04-19 ENCOUNTER — Encounter: Payer: Self-pay | Admitting: Internal Medicine

## 2023-04-19 VITALS — BP 132/76 | HR 90 | Ht 69.0 in | Wt 138.2 lb

## 2023-04-19 DIAGNOSIS — R5381 Other malaise: Secondary | ICD-10-CM

## 2023-04-19 DIAGNOSIS — E782 Mixed hyperlipidemia: Secondary | ICD-10-CM

## 2023-04-19 DIAGNOSIS — F419 Anxiety disorder, unspecified: Secondary | ICD-10-CM

## 2023-04-19 DIAGNOSIS — J9611 Chronic respiratory failure with hypoxia: Secondary | ICD-10-CM | POA: Diagnosis not present

## 2023-04-19 DIAGNOSIS — R7303 Prediabetes: Secondary | ICD-10-CM | POA: Diagnosis not present

## 2023-04-19 DIAGNOSIS — F3341 Major depressive disorder, recurrent, in partial remission: Secondary | ICD-10-CM | POA: Insufficient documentation

## 2023-04-19 DIAGNOSIS — M5137 Other intervertebral disc degeneration, lumbosacral region: Secondary | ICD-10-CM

## 2023-04-19 DIAGNOSIS — Z125 Encounter for screening for malignant neoplasm of prostate: Secondary | ICD-10-CM

## 2023-04-19 DIAGNOSIS — J449 Chronic obstructive pulmonary disease, unspecified: Secondary | ICD-10-CM | POA: Diagnosis not present

## 2023-04-19 DIAGNOSIS — E559 Vitamin D deficiency, unspecified: Secondary | ICD-10-CM | POA: Diagnosis not present

## 2023-04-19 DIAGNOSIS — M159 Polyosteoarthritis, unspecified: Secondary | ICD-10-CM

## 2023-04-19 MED ORDER — TRAZODONE HCL 50 MG PO TABS
25.0000 mg | ORAL_TABLET | Freq: Every evening | ORAL | 5 refills | Status: DC | PRN
Start: 2023-04-19 — End: 2023-07-19

## 2023-04-19 NOTE — Assessment & Plan Note (Signed)
Overall stable On Breztri and as needed albuterol Also uses albuterol neb On oral steroids now Followed by Pulmonology

## 2023-04-19 NOTE — Patient Instructions (Signed)
Please start taking Trazodone 50 mg once at bedtime.  Please continue to take medications as prescribed.  Please continue to follow low salt diet.

## 2023-04-19 NOTE — Progress Notes (Addendum)
Established Patient Office Visit  Subjective:  Patient ID: Jeffrey Krueger, male    DOB: [birth year]  Age: 67 y.o. MRN: [id number]  CC:  Chief Complaint  Patient presents with   Fatigue    Patient is fatigued, has no energy , wanting to sleep all the time   trouble swallowing    Patient is having trouble swallowing    HPI Jeffrey Krueger is a 67 y.o. male with past medical history of COPD, hypoxic respiratory failure, anxiety and tobacco abuse who presents for f/u of his chronic medical conditions.  He has been using Breztri and as needed albuterol for COPD.  He follows up with Pulmonology for COPD and chronic hypoxic respiratory failure. He has continuous home O2 for it now.  He still smokes about 0.5 pack/day.  He is on oral prednisone for chronic hypoxia.  He complains of daytime fatigue and drowsiness.  He currently takes Wellbutrin 200 mg BID for GAD and trazodone 150 mg nightly for insomnia. He denies any anhedonia.  He has anxiety at nighttime when he has to use O2 while sleeping. He denies any SI or HI currently.  He has difficulty ambulating due to chronic hypoxia and generalized OA, DDD of lumbar spine.  He has rolling walker currently, but needs help most of the time for proper ambulation.  He has wrist and hand joint arthritis as well, which limits his ability to operate manual wheelchair. He is able to safely transfer to and from a power operated vehicle, operate the tiller steering system and maintain postural stability and position while operating the POV in the home. His home has enough space for adequate access between rooms for using POV. He would benefit from POV/Scooter to improve his independence with MRALDs.   He needs a letter for building a ramp at his living facility for his proper ambulation, which has been provided today.     Past Medical History:  Diagnosis Date   COPD (chronic obstructive pulmonary disease) (HCC)    Headache(784.0)    Shortness of breath      Past Surgical History:  Procedure Laterality Date   BACK SURGERY      History reviewed. No pertinent family history.  Social History   Socioeconomic History   Marital status: Married    Spouse name: Not on file   Number of children: Not on file   Years of education: Not on file   Highest education level: Not on file  Occupational History   Not on file  Tobacco Use   Smoking status: Every Day    Current packs/day: 0.50    Average packs/day: 0.5 packs/day for 49.4 years (24.7 ttl pk-yrs)    Types: Cigarettes    Start date: 02/06/1974   Smokeless tobacco: Never   Tobacco comments:     7-8 cigarettes a day updated 06/07/2023 amy marsh, cma  Substance and Sexual Activity   Alcohol use: No    Alcohol/week: 0.0 standard drinks of alcohol   Drug use: No   Sexual activity: Not on file  Other Topics Concern   Not on file  Social History Narrative   Not on file   Social Determinants of Health   Financial Resource Strain: Low Risk  (07/02/2023)   Overall Financial Resource Strain (CARDIA)    Difficulty of Paying Living Expenses: Not hard at all  Food Insecurity: No Food Insecurity (07/10/2023)   Hunger Vital Sign    Worried About Running Out of Food in the  Last Year: Never true    Ran Out of Food in the Last Year: Never true  Transportation Needs: No Transportation Needs (07/10/2023)   PRAPARE - Administrator, Civil Service (Medical): No    Lack of Transportation (Non-Medical): No  Physical Activity: Patient Declined (07/02/2023)   Exercise Vital Sign    Days of Exercise per Week: Patient declined    Minutes of Exercise per Session: Patient declined  Stress: Stress Concern Present (07/02/2023)   [name] of Occupational Health - Occupational Stress Questionnaire    Feeling of Stress : Rather much  Social Connections: Socially Isolated (07/02/2023)   Social Connection and Isolation Panel [NHANES]    Frequency of Communication with Friends and Family:  More than three times a week    Frequency of Social Gatherings with Friends and Family: More than three times a week    Attends Religious Services: Never    Database administrator or Organizations: No    Attends Banker Meetings: Never    Marital Status: Separated  Intimate Partner Violence: Not At Risk (07/02/2023)   Humiliation, Afraid, Rape, and Kick questionnaire    Fear of Current or Ex-Partner: No    Emotionally Abused: No    Physically Abused: No    Sexually Abused: No    Outpatient Medications Prior to Visit  Medication Sig Dispense Refill   albuterol (PROVENTIL) (2.5 MG/3ML) 0.083% nebulizer solution INHALE CONTENTS OF 1 VIAL IN NEBULIZER EVERY 4 HOURS IF NEEDED FOR WHEEZING 450 mL 3   albuterol (VENTOLIN HFA) 108 (90 Base) MCG/ACT inhaler INHALE 2 PUFFS BY MOUTH EVERY 4 HOURS AS NEEDED 18 each 5   BREZTRI AEROSPHERE 160-9-4.8 MCG/ACT AERO INHALE 2 PUFFS INTO THE LUNGS IN THE MORNING AND AT BEDTIME. 10.7 each 11   buPROPion (WELLBUTRIN SR) 200 MG 12 hr tablet TAKE 1 TABLET BY MOUTH TWICE A DAY (Patient taking differently: Take 200 mg by mouth 2 (two) times daily. Patient taking once a day.) 180 tablet 1   fluticasone (FLONASE) 50 MCG/ACT nasal spray SPRAY 2 SPRAYS INTO EACH NOSTRIL EVERY DAY 48 mL 0   Olopatadine HCl 0.2 % SOLN Apply 1 drop to eye daily. 2.5 mL 0   omeprazole (PRILOSEC) 40 MG capsule TAKE 1 CAPSULE (40 MG TOTAL) BY MOUTH DAILY. 90 capsule 3   Respiratory Therapy Supplies (NEBULIZER MASK ADULT) MISC For Albuterol nebulization. 1 each 0   predniSONE (DELTASONE) 10 MG tablet TAKE 2 TABLETS EVERY DAY WITH BREAKFAST 60 tablet 11   predniSONE (DELTASONE) 5 MG tablet Take 3 tablets (15 mg total) by mouth daily with breakfast. (Patient not taking: Reported on 12/19/2022) 120 tablet 2   Respiratory Therapy Supplies (NEBULIZER AIR TUBE/PLUGS) MISC For Albuterol nebulization. 1 each 0   traZODone (DESYREL) 150 MG tablet TAKE 1 TABLET BY MOUTH EVERYDAY AT BEDTIME  90 tablet 1   Vitamin D, Ergocalciferol, (DRISDOL) 1.25 MG (50000 UNIT) CAPS capsule TAKE 1 CAPSULE (50,000 UNITS TOTAL) BY MOUTH EVERY 7 (SEVEN) DAYS 12 capsule 1   No facility-administered medications prior to visit.    No Known Allergies  ROS Review of Systems  Constitutional:  Positive for fatigue. Negative for chills and fever.  HENT:  Negative for congestion and sore throat.   Eyes:  Negative for pain and discharge.  Respiratory:  Positive for cough, shortness of breath and wheezing.   Cardiovascular:  Negative for chest pain and palpitations.  Gastrointestinal:  Negative for diarrhea, nausea and vomiting.  Endocrine: Negative for polydipsia and polyuria.  Genitourinary:  Negative for dysuria and hematuria.  Musculoskeletal:  Positive for arthralgias, back pain and neck pain. Negative for neck stiffness.  Skin:  Negative for rash.  Neurological:  Negative for dizziness, weakness, numbness and headaches.  Psychiatric/Behavioral:  Positive for sleep disturbance. Negative for agitation and behavioral problems. The patient is nervous/anxious.       Objective:    Physical Exam Vitals reviewed.  Constitutional:      General: He is not in acute distress.    Appearance: He is not diaphoretic.     Comments: Has rolling walker  HENT:     Head: Normocephalic and atraumatic.     Nose: Nose normal.     Mouth/Throat:     Mouth: Mucous membranes are moist.  Eyes:     General: No scleral icterus.    Extraocular Movements: Extraocular movements intact.  Cardiovascular:     Rate and Rhythm: Normal rate and regular rhythm.     Pulses: Normal pulses.     Heart sounds: Normal heart sounds. No murmur heard. Pulmonary:     Breath sounds: No wheezing or rales.     Comments: O2 - 3 lpm Musculoskeletal:     Right wrist: Bony tenderness present. Decreased range of motion.     Left wrist: Bony tenderness present. Decreased range of motion.     Right hand: Tenderness present.     Left  hand: Tenderness present.     Cervical back: Neck supple. No tenderness.     Right lower leg: No edema.     Left lower leg: No edema.  Skin:    General: Skin is warm.     Findings: No rash.  Neurological:     General: No focal deficit present.     Mental Status: He is alert and oriented to person, place, and time.     Sensory: No sensory deficit.     Motor: Weakness (3/5 - b/l LE) present.  Psychiatric:        Mood and Affect: Mood normal.        Behavior: Behavior normal.     BP 132/76 (BP Location: Left Arm, Patient Position: Sitting, Cuff Size: Normal)   Pulse 90   Ht 5\' 9"  (1.753 m)   Wt 138 lb 3.2 oz (62.7 kg)   SpO2 98%   BMI 20.41 kg/m  Wt Readings from Last 3 Encounters:  07/02/23 138 lb (62.6 kg)  06/07/23 132 lb 12.8 oz (60.2 kg)  04/19/23 138 lb 3.2 oz (62.7 kg)    Lab Results  Component Value Date   TSH 1.260 04/19/2023   Lab Results  Component Value Date   WBC 10.0 04/19/2023   HGB 13.6 04/19/2023   HCT 42.2 04/19/2023   MCV 94 04/19/2023   PLT 264 04/19/2023   Lab Results  Component Value Date   NA 141 04/19/2023   K 3.6 04/19/2023   CO2 32 (H) 04/19/2023   GLUCOSE 79 04/19/2023   BUN 18 04/19/2023   CREATININE 0.95 04/19/2023   BILITOT 0.8 04/19/2023   ALKPHOS 60 04/19/2023   AST 13 04/19/2023   ALT 12 04/19/2023   PROT 5.1 (L) 04/19/2023   ALBUMIN 3.7 (L) 04/19/2023   CALCIUM 8.8 04/19/2023   ANIONGAP 10 07/07/2016   EGFR 88 04/19/2023   Lab Results  Component Value Date   CHOL 163 04/19/2023   Lab Results  Component Value Date   HDL 52 04/19/2023  Lab Results  Component Value Date   LDLCALC 99 04/19/2023   Lab Results  Component Value Date   TRIG 62 04/19/2023   Lab Results  Component Value Date   CHOLHDL 3.1 04/19/2023   Lab Results  Component Value Date   HGBA1C 5.4 04/19/2023      Assessment & Plan:   Problem List Items Addressed This Visit       Respiratory   COPD (chronic obstructive pulmonary disease)  (HCC)    Overall stable On Breztri and as needed albuterol Also uses albuterol neb On oral steroids now Followed by Pulmonology      Chronic respiratory failure with hypoxia (HCC)    Has been 2-3 l O2 at home since hospitalization for COPD exacerbation in 08/2020 Underlying COPD and continuous tobacco abuse Albuterol nebulizer PRN, supplies sent  Has limited mobility due to chronic hypoxic respiratory failure, needs wheelchair - further information under physical deconditioning      Relevant Orders   CMP14+EGFR (Completed)   CBC with Differential/Platelet (Completed)   DME Wheelchair electric     Musculoskeletal and Integument   Primary osteoarthritis involving multiple joints    Has OA of wrist and hands Limiting his ability to operate manual wheelchair currently, would benefit from electric wheelchair for better mobility with independence      Relevant Orders   DME Wheelchair electric   DDD (degenerative disc disease), lumbosacral    Has persistent low back pain, likely due to DDD of lumbosacral area Has very limited mobility due to chronic hypoxic respiratory failure as well Unable to perform stretching exercises currently On chronic steroids for COPD, which can worsen DDD of lumbosacral spine      Relevant Orders   DME Wheelchair electric     Other   Anxiety - Primary    Overall well-controlled with Wellbutrin 200 mg twice daily Decreased dose of trazodone to 50 mg nightly due to daytime drowsiness      Relevant Medications   traZODone (DESYREL) 50 MG tablet   Vitamin D deficiency    Advised to take vitamin D 5000 IU QD      Relevant Orders   VITAMIN D 25 Hydroxy (Vit-D Deficiency, Fractures) (Completed)   Recurrent major depressive disorder, in partial remission (HCC)    Overall well controlled with Wellbutrin Decrease dose of trazodone to 50 mg nightly due to daytime drowsiness      Relevant Medications   traZODone (DESYREL) 50 MG tablet   Other  Relevant Orders   TSH (Completed)   CMP14+EGFR (Completed)   CBC with Differential/Platelet (Completed)   Prostate cancer screening    Ordered PSA after discussing its limitations for prostate cancer screening, including false positive results leading to additional investigations.      Relevant Orders   PSA (Completed)   Physical deconditioning    Due to OA, chronic hypoxic respiratory failure and lack of mobility Dependent for ADLs  Has not used manual wheelchair due to OA of wrist and hand, but needs an electric wheelchair for better mobility in his home independently, which will improve his quality of life  He can safely use electric wheelchair. He is willing and motivated to use the power mobility device in the home.   Also needs raised toilet seat as he has difficulty with the current low level toilet seat and has high risk of fall      Other Visit Diagnoses     Mixed hyperlipidemia       Relevant  Orders   Lipid panel (Completed)   Prediabetes       Relevant Orders   Hemoglobin A1c (Completed)        Meds ordered this encounter  Medications   traZODone (DESYREL) 50 MG tablet    Sig: Take 0.5-1 tablets (25-50 mg total) by mouth at bedtime as needed for sleep.    Dispense:  30 tablet    Refill:  5    Follow-up: Return in about 3 months (around 07/20/2023) for GAD and chronic fatigue.    Anabel Halon, MD

## 2023-04-19 NOTE — Assessment & Plan Note (Addendum)
Has been 2-3 l O2 at home since hospitalization for COPD exacerbation in 08/2020 Underlying COPD and continuous tobacco abuse Albuterol nebulizer PRN, supplies sent  Has limited mobility due to chronic hypoxic respiratory failure, needs wheelchair - further information under physical deconditioning

## 2023-04-20 DIAGNOSIS — R5381 Other malaise: Secondary | ICD-10-CM | POA: Insufficient documentation

## 2023-04-20 LAB — CMP14+EGFR: Alkaline Phosphatase: 60 IU/L (ref 44–121)

## 2023-04-20 NOTE — Assessment & Plan Note (Signed)
Advised to take vitamin D 5000 IU QD 

## 2023-04-20 NOTE — Assessment & Plan Note (Signed)
Has persistent low back pain, likely due to DDD of lumbosacral area Has very limited mobility due to chronic hypoxic respiratory failure as well Unable to perform stretching exercises currently On chronic steroids for COPD, which can worsen DDD of lumbosacral spine

## 2023-04-20 NOTE — Assessment & Plan Note (Addendum)
Due to OA, chronic hypoxic respiratory failure and lack of mobility Dependent for ADLs  Has not used manual wheelchair due to OA of wrist and hand, but needs an electric wheelchair for better mobility in his home independently, which will improve his quality of life  He can safely use electric wheelchair. He is willing and motivated to use the power mobility device in the home.   Also needs raised toilet seat as he has difficulty with the current low level toilet seat and has high risk of fall

## 2023-04-20 NOTE — Assessment & Plan Note (Signed)
Ordered PSA after discussing its limitations for prostate cancer screening, including false positive results leading to additional investigations. 

## 2023-04-20 NOTE — Assessment & Plan Note (Signed)
Has OA of wrist and hands Limiting his ability to operate manual wheelchair currently, would benefit from electric wheelchair for better mobility with independence

## 2023-04-20 NOTE — Assessment & Plan Note (Signed)
Overall well-controlled with Wellbutrin 200 mg twice daily Decreased dose of trazodone to 50 mg nightly due to daytime drowsiness

## 2023-04-20 NOTE — Assessment & Plan Note (Signed)
Overall well controlled with Wellbutrin Decrease dose of trazodone to 50 mg nightly due to daytime drowsiness

## 2023-04-23 ENCOUNTER — Telehealth: Payer: Self-pay | Admitting: Internal Medicine

## 2023-04-23 NOTE — Telephone Encounter (Signed)
Faxed to [facility].

## 2023-04-23 NOTE — Telephone Encounter (Signed)
Patient called was told to contact our office to give Dr Allena Katz where to send the electric scooter prescription and notes to.  [facility]. Their fax # [phone]

## 2023-04-25 ENCOUNTER — Telehealth: Payer: Self-pay | Admitting: Internal Medicine

## 2023-04-25 NOTE — Telephone Encounter (Signed)
Patient called in regard to traZODone (DESYREL) 50 MG tablet [[id number]]    States that he discussed with provider 75MG  and got 50mg  instead.   The 50mg  is not helping patient sleep wants to have 75mg  sent in.  Patient wants a call back

## 2023-04-25 NOTE — Telephone Encounter (Signed)
Patient advised.

## 2023-04-28 ENCOUNTER — Other Ambulatory Visit: Payer: Self-pay | Admitting: Emergency Medicine

## 2023-05-03 ENCOUNTER — Other Ambulatory Visit: Payer: Self-pay | Admitting: Internal Medicine

## 2023-05-03 DIAGNOSIS — J9611 Chronic respiratory failure with hypoxia: Secondary | ICD-10-CM

## 2023-05-04 ENCOUNTER — Telehealth: Payer: Self-pay | Admitting: Internal Medicine

## 2023-05-04 ENCOUNTER — Other Ambulatory Visit (HOSPITAL_COMMUNITY): Payer: Self-pay

## 2023-05-04 ENCOUNTER — Other Ambulatory Visit: Payer: Self-pay

## 2023-05-04 DIAGNOSIS — J9611 Chronic respiratory failure with hypoxia: Secondary | ICD-10-CM

## 2023-05-04 MED ORDER — NEBULIZER AIR TUBE/PLUGS MISC: 0 refills | Status: AC

## 2023-05-04 MED ORDER — NEBULIZER AIR TUBE/PLUGS MISC
0 refills | Status: DC
  Filled 2023-05-04: qty 1, fill #0

## 2023-05-04 NOTE — Telephone Encounter (Signed)
Sent to [facility]

## 2023-05-04 NOTE — Telephone Encounter (Signed)
Patient needs Rx #: [id number]  Respiratory Therapy Supplies (NEBULIZER AIR TUBE/PLUGS) MISC  Sent to [facility]

## 2023-05-11 ENCOUNTER — Telehealth: Payer: Self-pay | Admitting: Emergency Medicine

## 2023-05-11 DIAGNOSIS — J9611 Chronic respiratory failure with hypoxia: Secondary | ICD-10-CM

## 2023-05-11 NOTE — Telephone Encounter (Signed)
PT calling to speak w/Dr. Delton Coombes or Triage because [employer] said he needs to send in a req for his O2 as a 90 day supply. These are for the large 4 hour tanks.     PT's # [phone]   (He really liked the old way of ordering but they are making him change.)

## 2023-05-14 NOTE — Telephone Encounter (Signed)
Had a incident last night and had to do emergency call for O2, on Friday he called about getting his o2 order sent in

## 2023-05-15 NOTE — Telephone Encounter (Signed)
Spoke with the pt   He says that his concentrator stopped working a week ago  He called [employer] and they came out to replace it  He is now asking for order for back up tanks, tubing and nasal cannulas  I have placed order and [place] aware to sign asap  Nothing further needed

## 2023-05-16 ENCOUNTER — Telehealth: Payer: Self-pay | Admitting: Emergency Medicine

## 2023-05-16 DIAGNOSIS — J449 Chronic obstructive pulmonary disease, unspecified: Secondary | ICD-10-CM

## 2023-05-16 MED ORDER — PREDNISONE 10 MG PO TABS: ORAL_TABLET | ORAL | 11 refills | Status: DC

## 2023-05-16 NOTE — Telephone Encounter (Signed)
Let him know I refilled prednisone and that we have made an OV for him to come in and get his O2 titrated.

## 2023-05-16 NOTE — Telephone Encounter (Signed)
Wants to switch his order from a power chair to a scooter

## 2023-05-16 NOTE — Telephone Encounter (Signed)
Pt is switching from [facility] to [facility] and they are needing a updated or using the O2 template, Sats/walk test results, and OV notes within the last 90 days with the mention of O2 need.

## 2023-05-16 NOTE — Telephone Encounter (Signed)
Attempted to call pt, no naswer. Lvmm for pt to call the office back to get scheduled for a OV

## 2023-05-16 NOTE — Telephone Encounter (Signed)
Good morning Dr. Delton Coombes This pt is trying to switch from [facility] to [facility]. They are requesting an updated or using the O2 template, Sats/walk test results, and OV notes within the last 90 days with the mention of O2 need.

## 2023-05-24 NOTE — Telephone Encounter (Signed)
Left message for patient to call back  

## 2023-05-24 NOTE — Telephone Encounter (Signed)
Patient called back. Aware of RB message.nothing further needed.

## 2023-05-24 NOTE — Telephone Encounter (Signed)
Ok to place order for scooter?

## 2023-05-25 ENCOUNTER — Telehealth (HOSPITAL_BASED_OUTPATIENT_CLINIC_OR_DEPARTMENT_OTHER): Payer: Self-pay | Admitting: Emergency Medicine

## 2023-05-25 MED ORDER — PREDNISONE 10 MG PO TABS: ORAL_TABLET | ORAL | 11 refills | Status: DC

## 2023-05-25 NOTE — Telephone Encounter (Signed)
Patient aware of med sent to [facility].

## 2023-05-25 NOTE — Telephone Encounter (Signed)
Patient states he is needing his prednisone refilled at [facility] instead of [facility] and wondering if it was too late to change? Please advise if it is too late for patient and call back.

## 2023-05-25 NOTE — Telephone Encounter (Signed)
yes

## 2023-06-07 ENCOUNTER — Encounter: Payer: Self-pay | Admitting: Emergency Medicine

## 2023-06-07 ENCOUNTER — Ambulatory Visit (INDEPENDENT_AMBULATORY_CARE_PROVIDER_SITE_OTHER): Payer: Medicare HMO | Admitting: Emergency Medicine

## 2023-06-07 VITALS — BP 144/76 | HR 99 | Temp 98.2°F | Ht 69.0 in | Wt 132.8 lb

## 2023-06-07 DIAGNOSIS — Z720 Tobacco use: Secondary | ICD-10-CM

## 2023-06-07 DIAGNOSIS — J9611 Chronic respiratory failure with hypoxia: Secondary | ICD-10-CM | POA: Diagnosis not present

## 2023-06-07 DIAGNOSIS — J449 Chronic obstructive pulmonary disease, unspecified: Secondary | ICD-10-CM | POA: Diagnosis not present

## 2023-06-07 MED ORDER — PREDNISONE 20 MG PO TABS: 20.0000 mg | ORAL_TABLET | Freq: Every day | ORAL | 3 refills | Status: DC

## 2023-06-07 NOTE — Assessment & Plan Note (Signed)
Work on decreasing your cigarettes if at all possible

## 2023-06-07 NOTE — Assessment & Plan Note (Signed)
We will check your room air oxygen saturations to requalify you for oxygen, change your DME company to [facility]. We will contact [facility] to check on the status of your order for your motorized scooter Continue prednisone 20 mg once daily.  We will refill this for you today. Continue Breztri 2 puffs twice a day.  Rinse and gargle after using. Keep your albuterol available to use 2 puffs or 1 nebulizer treatment up to every 4 hours if needed for shortness of breath, chest tightness, wheezing. Flu shot is up-to-date Follow with APP in 2 months Follow Dr. Delton Coombes in 6 months, sooner if you have problems.

## 2023-06-07 NOTE — Progress Notes (Signed)
Subjective:    Patient ID: Jeffrey Krueger, male    DOB: 1955/10/23, 67 y.o.   MRN: [id number]  HPI  ROV 12/02/21 --follow-up visit for 67 year old man with end-stage COPD, continued tobacco use, associated chronic hypoxemic respiratory failure on 2 to 3 L/min.  Currently managed on Breztri, uses albuterol approximately He had a right upper lobe pneumonia and we have been following resolution of residual nodular scar on serial CT scans of the chest.  I started him on prednisone 20 mg daily in January, decreased and now on 15 mg daily.  At his most recent visit with Jeffrey Krueger he confirmed that he would not want to be intubated if he had acute respiratory failure.  He would want all other aggressive interventions including ACLS or BiPAP.  Today he reports that he is feeling better since we started the steroids, less SOB. He is having some cough, less productive. Smoking 10 cig a day.  He is waiting for an [employer] portable oxygen concentrator, does not have this yet.  ROV 12/19/22 --67 year old gentleman with end-stage COPD and continued tobacco use.  He has chronic hypoxemic respiratory failure.  He has nodular scar on serial CT imaging following a right upper lobe pneumonia.  He is on chronic prednisone, but no longer takes every day, just prn - probably 2x a week.  Currently managed on Breztri, uses albuterol approximately 3-4x a day, nebs and HFA. On 3L/min at all times.   ROV 06/07/2023 --follow-up visit for Jeffrey Krueger.  He is 51 with end-stage COPD, continued tobacco use, associated chronic hypoxemic respiratory failure.  He is on chronic prednisone, has been on pred 20mg  every day. He remains on Breztri. Uses albuterol about 3x a day.  He wants to change oxygen companies from [facility] to [facility], needs a walking oximetry today He was trying to get either a power chair or scooter.  This was ordered on 05/25/2023, likely will need dedicated paperwork   Review of Systems As per HPI     Objective:    Physical Exam Vitals:   06/07/23 1417  BP: (!) 144/76  Pulse: 99  Temp: 98.2 F (36.8 C)  TempSrc: Oral  SpO2: 97%  Weight: 132 lb 12.8 oz (60.2 kg)  Height: 5\' 9"  (1.753 m)    Gen: Pleasant, cachectic, in no distress,  normal affect  ENT: No lesions,  mouth clear,  oropharynx clear, no postnasal drip  Neck: No JVD, no stridor  Lungs: Mild accessory muscle use, no crackles. He does have B wheezes on normal breath and forced exp  Cardiovascular: RRR, heart sounds normal, no murmur or gallops, no peripheral edema  Musculoskeletal: No deformities, no cyanosis or clubbing  Neuro: alert, awake, non focal  Skin: Warm, no lesions or rash      Assessment & Plan:  COPD (chronic obstructive pulmonary disease) (HCC) We will check your room air oxygen saturations to requalify you for oxygen, change your DME company to [facility]. We will contact [facility] to check on the status of your order for your motorized scooter Continue prednisone 20 mg once daily.  We will refill this for you today. Continue Breztri 2 puffs twice a day.  Rinse and gargle after using. Keep your albuterol available to use 2 puffs or 1 nebulizer treatment up to every 4 hours if needed for shortness of breath, chest tightness, wheezing. Flu shot is up-to-date Follow with APP in 2 months Follow Dr. Delton Coombes in 6 months, sooner if you have problems.  Tobacco abuse Work on decreasing your cigarettes if at all possible      Jeffrey Pupa, MD, PhD 06/07/2023, 2:34 PM [facility] [phone] or if no answer before 7:00PM call [phone] For any issues after 7:00PM please call eLink [phone]

## 2023-06-07 NOTE — Patient Instructions (Signed)
We will check your room air oxygen saturations to requalify you for oxygen, change your DME company to [facility]. We will contact [facility] to check on the status of your order for your motorized scooter Continue prednisone 20 mg once daily.  We will refill this for you today. Continue Breztri 2 puffs twice a day.  Rinse and gargle after using. Keep your albuterol available to use 2 puffs or 1 nebulizer treatment up to every 4 hours if needed for shortness of breath, chest tightness, wheezing. Flu shot is up-to-date Work on decreasing your cigarettes if at all possible Follow with APP in 2 months Follow Dr. Delton Coombes in 6 months, sooner if you have problems.

## 2023-06-07 NOTE — Addendum Note (Signed)
Addended byClyda Greener M on: 06/07/2023 04:52 PM   Modules accepted: Orders

## 2023-06-08 ENCOUNTER — Encounter: Payer: Self-pay | Admitting: Internal Medicine

## 2023-06-12 NOTE — Progress Notes (Signed)
SATURATION QUALIFICATIONS: (This note is used to comply with regulatory documentation for home oxygen)  Patient Saturations on Room Air at Rest = 94%  Patient Saturations on Room Air while Ambulating = 83%  Patient Saturations on 3 Liters of oxygen while Ambulating = 95%  Please briefly explain why patient needs home oxygen:  Patient needs 3L oxygen continuous (tank) during ambulation to keep SaO2 at or above 88%-90% or higher.  Also patient needs a motorized scooter to help with daily activities.  Walking any distance causing severe SOB and dyspnea.

## 2023-06-21 ENCOUNTER — Telehealth: Payer: Self-pay | Admitting: Emergency Medicine

## 2023-06-21 NOTE — Telephone Encounter (Signed)
Order was confirmed by [employer] on 06/08/23. I have faxed all information to [employer] at fax # [phone]

## 2023-06-21 NOTE — Telephone Encounter (Signed)
[employer] needs new RX and Stat sheet for this PT O2.   PT calling to check on where his O2 is.   Send to [employer] [phone]

## 2023-06-27 ENCOUNTER — Other Ambulatory Visit: Payer: Self-pay | Admitting: Internal Medicine

## 2023-06-27 ENCOUNTER — Telehealth: Payer: Self-pay | Admitting: Internal Medicine

## 2023-06-27 ENCOUNTER — Telehealth: Payer: Self-pay | Admitting: Emergency Medicine

## 2023-06-27 NOTE — Addendum Note (Signed)
Addended byTrena Platt on: 06/27/2023 03:01 PM   Modules accepted: Orders

## 2023-06-27 NOTE — Telephone Encounter (Signed)
Patient called in has changed to New oxygen supplier.  Patient needs provider to send out note to [employer], [place] to have old supplies picked up from patients residence.  Fax # [employer] [phone]       Needs refill to [facility]  Vitamin D, Ergocalciferol, (DRISDOL) 1.25 MG (50000 UNIT) CAPS capsule [[id number]]

## 2023-06-27 NOTE — Telephone Encounter (Signed)
Patient's daughter is calling in reference to father's oxygen. He has a new company and they would like for him to come in and do a walk to qualify for [employer] but he needs full time oxygen. She would like to know the proper machine that is needed for his oxygen.

## 2023-06-27 NOTE — Telephone Encounter (Signed)
Patient advised.

## 2023-06-28 NOTE — Telephone Encounter (Signed)
Spoke with Jared/[employer], he states O2 system was delivered to patient on 06/26/23. Patient is scheduled for POC eval walk tomorrow. Nothing further needed at this time.

## 2023-06-29 ENCOUNTER — Other Ambulatory Visit: Payer: Self-pay | Admitting: Internal Medicine

## 2023-06-29 DIAGNOSIS — Z1211 Encounter for screening for malignant neoplasm of colon: Secondary | ICD-10-CM

## 2023-06-29 DIAGNOSIS — Z1212 Encounter for screening for malignant neoplasm of rectum: Secondary | ICD-10-CM

## 2023-06-29 NOTE — Telephone Encounter (Signed)
Rec'd confirmation that 13 pages were received by [employer] on 06/21/23

## 2023-07-02 ENCOUNTER — Encounter: Payer: Self-pay | Admitting: *Deleted

## 2023-07-02 ENCOUNTER — Ambulatory Visit (INDEPENDENT_AMBULATORY_CARE_PROVIDER_SITE_OTHER): Payer: Medicare HMO

## 2023-07-02 VITALS — Ht 69.0 in | Wt 138.0 lb

## 2023-07-02 DIAGNOSIS — Z0100 Encounter for examination of eyes and vision without abnormal findings: Secondary | ICD-10-CM

## 2023-07-02 DIAGNOSIS — Z789 Other specified health status: Secondary | ICD-10-CM

## 2023-07-02 DIAGNOSIS — Z0000 Encounter for general adult medical examination without abnormal findings: Secondary | ICD-10-CM | POA: Diagnosis not present

## 2023-07-02 DIAGNOSIS — Z1211 Encounter for screening for malignant neoplasm of colon: Secondary | ICD-10-CM

## 2023-07-02 NOTE — Progress Notes (Signed)
 Because this visit was a virtual/telehealth visit,  certain criteria was not obtained, such a blood pressure, CBG if applicable, and timed get up and go. Any medications not marked as "taking" were not mentioned during the medication reconciliation part of the visit. Any vitals not documented were not able to be obtained due to this being a telehealth visit or patient was unable to self-report a recent blood pressure reading due to a lack of equipment at home via telehealth. Vitals that have been documented are verbally provided by the patient.   Subjective:   Jeffrey Krueger is a 67 y.o. male who presents for Medicare Annual/Subsequent preventive examination.  Visit Complete: Virtual  I connected with  Jeffrey Krueger on 07/02/23 by a audio enabled telemedicine application and verified that I am speaking with the correct person using two identifiers.  Patient Location: Home  Provider Location: Home Office  I discussed the limitations of evaluation and management by telemedicine. The patient expressed understanding and agreed to proceed.  Patient Medicare AWV questionnaire was completed by the patient on na; I have confirmed that all information answered by patient is correct and no changes since this date.  Cardiac Risk Factors include: advanced age (>20men, >68 women);dyslipidemia;hypertension;male gender;sedentary lifestyle;smoking/ tobacco exposure;Other (see comment), Risk factor comments: COPD and Ephysema     Objective:    Today's Vitals   07/02/23 1106 07/02/23 1107  Weight: 138 lb (62.6 kg)   Height: 5\' 9"  (1.753 m)   PainSc:  10-Worst pain ever   Body mass index is 20.38 kg/m.     07/02/2023   11:13 AM 06/15/2022   11:10 AM 07/07/2016   10:48 PM 09/20/2013   11:31 PM  Advanced Directives  Does Patient Have a Medical Advance Directive? No No No Patient would not like information;Patient does not have advance directive  Would patient like information on creating a medical  advance directive? No - Patient declined No - Patient declined No - patient declined information   Pre-existing out of facility DNR order (yellow form or pink MOST form)    No    Current Medications (verified) Outpatient Encounter Medications as of 07/02/2023  Medication Sig   albuterol (PROVENTIL) (2.5 MG/3ML) 0.083% nebulizer solution INHALE CONTENTS OF 1 VIAL IN NEBULIZER EVERY 4 HOURS IF NEEDED FOR WHEEZING   albuterol (VENTOLIN HFA) 108 (90 Base) MCG/ACT inhaler INHALE 2 PUFFS BY MOUTH EVERY 4 HOURS AS NEEDED   BREZTRI AEROSPHERE 160-9-4.8 MCG/ACT AERO INHALE 2 PUFFS INTO THE LUNGS IN THE MORNING AND AT BEDTIME.   buPROPion (WELLBUTRIN SR) 200 MG 12 hr tablet TAKE 1 TABLET BY MOUTH TWICE A DAY (Patient taking differently: Take 200 mg by mouth 2 (two) times daily. Patient taking once a day.)   fluticasone (FLONASE) 50 MCG/ACT nasal spray SPRAY 2 SPRAYS INTO EACH NOSTRIL EVERY DAY   Olopatadine HCl 0.2 % SOLN Apply 1 drop to eye daily.   omeprazole (PRILOSEC) 40 MG capsule TAKE 1 CAPSULE (40 MG TOTAL) BY MOUTH DAILY.   predniSONE (DELTASONE) 20 MG tablet Take 1 tablet (20 mg total) by mouth daily with breakfast.   Respiratory Therapy Supplies (NEBULIZER AIR TUBE/PLUGS) MISC For Albuterol nebulization.   Respiratory Therapy Supplies (NEBULIZER MASK ADULT) MISC For Albuterol nebulization.   traZODone (DESYREL) 50 MG tablet Take 0.5-1 tablets (25-50 mg total) by mouth at bedtime as needed for sleep.   No facility-administered encounter medications on file as of 07/02/2023.    Allergies (verified) Patient has no known allergies.  History: Past Medical History:  Diagnosis Date   COPD (chronic obstructive pulmonary disease) (HCC)    Headache(784.0)    Shortness of breath    Past Surgical History:  Procedure Laterality Date   BACK SURGERY     History reviewed. No pertinent family history. Social History   Socioeconomic History   Marital status: Married    Spouse name: Not on file    Number of children: Not on file   Years of education: Not on file   Highest education level: Not on file  Occupational History   Not on file  Tobacco Use   Smoking status: Every Day    Current packs/day: 0.50    Average packs/day: 0.5 packs/day for 49.4 years (24.7 ttl pk-yrs)    Types: Cigarettes    Start date: 02/06/1974   Smokeless tobacco: Never   Tobacco comments:     7-8 cigarettes a day updated 06/07/2023 amy marsh, cma  Substance and Sexual Activity   Alcohol use: No    Alcohol/week: 0.0 standard drinks of alcohol   Drug use: No   Sexual activity: Not on file  Other Topics Concern   Not on file  Social History Narrative   Not on file   Social Determinants of Health   Financial Resource Strain: Low Risk  (07/02/2023)   Overall Financial Resource Strain (CARDIA)    Difficulty of Paying Living Expenses: Not hard at all  Food Insecurity: No Food Insecurity (07/02/2023)   Hunger Vital Sign    Worried About Running Out of Food in the Last Year: Never true    Ran Out of Food in the Last Year: Never true  Transportation Needs: No Transportation Needs (07/02/2023)   PRAPARE - Administrator, Civil Service (Medical): No    Lack of Transportation (Non-Medical): No  Physical Activity: Patient Declined (07/02/2023)   Exercise Vital Sign    Days of Exercise per Week: Patient declined    Minutes of Exercise per Session: Patient declined  Stress: Stress Concern Present (07/02/2023)   [name] of Occupational Health - Occupational Stress Questionnaire    Feeling of Stress : Rather much  Social Connections: Socially Isolated (07/02/2023)   Social Connection and Isolation Panel [NHANES]    Frequency of Communication with Friends and Family: More than three times a week    Frequency of Social Gatherings with Friends and Family: More than three times a week    Attends Religious Services: Never    Database administrator or Organizations: No    Attends Tax inspector Meetings: Never    Marital Status: Separated    Tobacco Counseling Ready to quit: Yes Counseling given: Yes Tobacco comments:  7-8 cigarettes a day updated 06/07/2023 amy marsh, cma   Clinical Intake:  Pre-visit preparation completed: Yes  Pain : 0-10 Pain Score: 10-Worst pain ever Pain Type: Acute pain Pain Location: Back Pain Orientation: Lower Pain Descriptors / Indicators: Sharp Pain Onset: Yesterday Pain Frequency: Constant     BMI - recorded: 20.38 Nutritional Status: BMI of 19-24  Normal Nutritional Risks: None Diabetes: No  How often do you need to have someone help you when you read instructions, pamphlets, or other written materials from your doctor or pharmacy?: 4 - Often  Interpreter Needed?: No  Information entered by ::  Dillian Feig, CMA   Activities of Daily Living    07/02/2023   11:10 AM  In your present state of health, do you have any difficulty performing  the following activities:  Hearing? 0  Vision? 1  Comment referral placed for my eye doctor [facility] for patient  Difficulty concentrating or making decisions? 0  Walking or climbing stairs? 1  Comment stairs yes, tries to avoid stairs, walking on level ground sometimes is difficult  Dressing or bathing? 0  Doing errands, shopping? 1  Comment family helps him  Preparing Food and eating ? Y  Comment has a hard time preparing meals  Using the Toilet? N  In the past six months, have you accidently leaked urine? N  Do you have problems with loss of bowel control? N  Managing your Medications? N  Managing your Finances? N  Housekeeping or managing your Housekeeping? Y  Comment needs help with this    Patient Care Team: Anabel Halon, MD as PCP - General (Internal Medicine)  Indicate any recent Medical Services you may have received from other than [facility] providers in the past year (date may be approximate).     Assessment:   This is a routine wellness examination for  [place].  Hearing/Vision screen Hearing Screening - Comments:: Patient denies any hearing difficulties.   Vision Screening - Comments:: Referral placed for patient today    Goals Addressed             This Visit's Progress    Patient Stated       Patient stated his goal is to get well enough to discontinue oxygen        Depression Screen    07/02/2023   11:16 AM 04/19/2023    2:45 PM 10/18/2022    2:26 PM 06/15/2022   11:10 AM 04/17/2022    2:20 PM 03/22/2022   10:20 AM 10/18/2021    1:58 PM  PHQ 2/9 Scores  PHQ - 2 Score 3 0 2 1 0 3 0  PHQ- 9 Score 8  12   12      Fall Risk    07/02/2023   11:13 AM 04/19/2023    2:45 PM 10/18/2022    2:26 PM 06/15/2022   11:10 AM 04/17/2022    2:20 PM  Fall Risk   Falls in the past year? 0 0 0 0 0  Number falls in past yr: 0 0 0 0 0  Injury with Fall? 0 0 0 0 0  Risk for fall due to : Impaired mobility;Impaired balance/gait;Impaired vision   No Fall Risks No Fall Risks  Follow up Falls prevention discussed;Education provided   Falls evaluation completed Falls evaluation completed    MEDICARE RISK AT HOME: Medicare Risk at Home Any stairs in or around the home?: No If so, are there any without handrails?: No Home free of loose throw rugs in walkways, pet beds, electrical cords, etc?: Yes Adequate lighting in your home to reduce risk of falls?: Yes Life alert?: No Use of a cane, walker or w/c?: Yes Grab bars in the bathroom?: No Shower chair or bench in shower?: Yes Elevated toilet seat or a handicapped toilet?: No  TIMED UP AND GO:  Was the test performed?  No    Cognitive Function:    06/15/2022   11:11 AM  MMSE - Mini Mental State Exam  Not completed: Unable to complete        07/02/2023   11:15 AM 06/15/2022   11:11 AM  6CIT Screen  What Year? 0 points 0 points  What month? 0 points 0 points  What time? 0 points 0 points  Count back  from 20 0 points 0 points  Months in reverse 0 points 0 points  Repeat phrase 0  points 0 points  Total Score 0 points 0 points    Immunizations Immunization History  Administered Date(s) Administered   Fluad Quad(high Dose 65+) 10/18/2022   Influenza,inj,Quad PF,6+ Mos 08/30/2018, 11/02/2019, 10/27/2020   Influenza-Unspecified 06/26/2015, 07/27/2019   Pneumococcal Conjugate-13 12/08/2020   Pneumococcal Polysaccharide-23 09/05/2013, 04/17/2022    TDAP status: Due, Education has been provided regarding the importance of this vaccine. Advised may receive this vaccine at local pharmacy or Health Dept. Aware to provide a copy of the vaccination record if obtained from local pharmacy or Health Dept. Verbalized acceptance and understanding.  Flu Vaccine status: Due, Education has been provided regarding the importance of this vaccine. Advised may receive this vaccine at local pharmacy or Health Dept. Aware to provide a copy of the vaccination record if obtained from local pharmacy or Health Dept. Verbalized acceptance and understanding.  Pneumococcal vaccine status: Up to date  Covid-19 vaccine status: Information provided on how to obtain vaccines.   Qualifies for Shingles Vaccine? Yes   Zostavax completed No   Shingrix Completed?: No.    Education has been provided regarding the importance of this vaccine. Patient has been advised to call insurance company to determine out of pocket expense if they have not yet received this vaccine. Advised may also receive vaccine at local pharmacy or Health Dept. Verbalized acceptance and understanding.  Screening Tests Health Maintenance  Topic Date Due   COVID-19 Vaccine (1) Never done   DTaP/Tdap/Td (1 - Tdap) Never done   Zoster Vaccines- Shingrix (1 of 2) Never done   Fecal DNA (Cologuard)  Never done   INFLUENZA VACCINE  04/26/2023   Medicare Annual Wellness (AWV)  06/16/2023   Lung Cancer Screening  11/03/2023   Pneumonia Vaccine 33+ Years old  Completed   Hepatitis C Screening  Completed   HPV VACCINES  Aged Out     Health Maintenance  Health Maintenance Due  Topic Date Due   COVID-19 Vaccine (1) Never done   DTaP/Tdap/Td (1 - Tdap) Never done   Zoster Vaccines- Shingrix (1 of 2) Never done   Fecal DNA (Cologuard)  Never done   INFLUENZA VACCINE  04/26/2023   Medicare Annual Wellness (AWV)  06/16/2023    Colorectal cancer screening: Referral to GI placed 07/02/23. Pt aware the office will call re: appt.  Lung Cancer Screening: (Low Dose CT Chest recommended if Age 76-80 years, 20 pack-year currently smoking OR have quit w/in 15years.) does qualify.   Lung Cancer Screening Referral: last screening completed on 11/02/2022  Additional Screening:  Hepatitis C Screening: does not qualify; Completed 04/17/22  Vision Screening: Recommended annual ophthalmology exams for early detection of glaucoma and other disorders of the eye. Is the patient up to date with their annual eye exam?  No  Who is the provider or what is the name of the office in which the patient attends annual eye exams? Referral placed If pt is not established with a provider, would they like to be referred to a provider to establish care? Yes .   Dental Screening: Recommended annual dental exams for proper oral hygiene  Diabetic Foot Exam: na  Community Resource Referral / Chronic Care Management: CRR required this visit?  Yes   CCM required this visit?  No     Plan:     I have personally reviewed and noted the following in the patient's chart:  Medical and social history Use of alcohol, tobacco or illicit drugs  Current medications and supplements including opioid prescriptions. Patient is not currently taking opioid prescriptions. Functional ability and status Nutritional status Physical activity Advanced directives List of other physicians Hospitalizations, surgeries, and ER visits in previous 12 months Vitals Screenings to include cognitive, depression, and falls Referrals and appointments  In addition, I  have reviewed and discussed with patient certain preventive protocols, quality metrics, and best practice recommendations. A written personalized care plan for preventive services as well as general preventive health recommendations were provided to patient.     Jordan Hawks Nixxon Faria, CMA   07/02/2023   After Visit Summary: (Mail) Due to this being a telephonic visit, the after visit summary with patients personalized plan was offered to patient via mail   Nurse Notes:

## 2023-07-02 NOTE — Patient Instructions (Addendum)
Jeffrey Krueger , Thank you for taking time to come for your Medicare Wellness Visit. I appreciate your ongoing commitment to your health goals. Please review the following plan we discussed and let me know if I can assist you in the future.   Referrals/Orders/Follow-Ups/Clinician Recommendations:  Next Medicare Annual Wellness Visit: July 02, 2024 at 11:30am virtual visit A referral has been placed for you to see if there are any additional resources to help you with one of the following:     []   Transportation Needs   []   Utility Needs   []   Food Insecurity   []  Assistance with daily activities such as bath, dressing, and managing your medications   []  Housing Insecurity   []  Assistance with your medications   [x]  Need for a ramp If you haven't heard from anyone within the next 7 business days, please call them and let them know a referral has been placed  Concierge Line: [phone]  You have been referred to [facility].If you haven't heard from them within the next week, please call them to schedule your appointment.    Address: [address] [place], [place] [address] Phone: [phone]   This is a list of the screening recommended for you and due dates:  Health Maintenance  Topic Date Due   COVID-19 Vaccine (1) Never done   DTaP/Tdap/Td vaccine (1 - Tdap) Never done   Zoster (Shingles) Vaccine (1 of 2) Never done   Cologuard (Stool DNA test)  Never done   Flu Shot  04/26/2023   Screening for Lung Cancer  11/03/2023   Medicare Annual Wellness Visit  07/01/2024   Pneumonia Vaccine  Completed   Hepatitis C Screening  Completed   HPV Vaccine  Aged Out    Advanced directives: (Declined) Advance directive discussed with you today. Even though you declined this today, please call our office should you change your mind, and we can give you the proper paperwork for you to fill out.  Next Medicare Annual Wellness Visit scheduled for next year: Yes  Preventive Care  40 Years and Older, Male Preventive care refers to lifestyle choices and visits with your health care provider that can promote health and wellness. Preventive care visits are also called wellness exams. What can I expect for my preventive care visit? Counseling During your preventive care visit, your health care provider may ask about your: Medical history, including: Past medical problems. Family medical history. History of falls. Current health, including: Emotional well-being. Home life and relationship well-being. Sexual activity. Memory and ability to understand (cognition). Lifestyle, including: Alcohol, nicotine or tobacco, and drug use. Access to firearms. Diet, exercise, and sleep habits. Work and work Astronomer. Sunscreen use. Safety issues such as seatbelt and bike helmet use. Physical exam Your health care provider will check your: Height and weight. These may be used to calculate your BMI (body mass index). BMI is a measurement that tells if you are at a healthy weight. Waist circumference. This measures the distance around your waistline. This measurement also tells if you are at a healthy weight and may help predict your risk of certain diseases, such as type 2 diabetes and high blood pressure. Heart rate and blood pressure. Body temperature. Skin for abnormal spots. What immunizations do I need?  Vaccines are usually given at various ages, according to a schedule. Your health care provider will recommend vaccines for you based on your age, medical history, and lifestyle or other factors, such as travel or where  you work. What tests do I need? Screening Your health care provider may recommend screening tests for certain conditions. This may include: Lipid and cholesterol levels. Diabetes screening. This is done by checking your blood sugar (glucose) after you have not eaten for a while (fasting). Hepatitis C test. Hepatitis B test. HIV (human immunodeficiency  virus) test. STI (sexually transmitted infection) testing, if you are at risk. Lung cancer screening. Colorectal cancer screening. Prostate cancer screening. Abdominal aortic aneurysm (AAA) screening. You may need this if you are a current or former smoker. Talk with your health care provider about your test results, treatment options, and if necessary, the need for more tests. Follow these instructions at home: Eating and drinking  Eat a diet that includes fresh fruits and vegetables, whole grains, lean protein, and low-fat dairy products. Limit your intake of foods with high amounts of sugar, saturated fats, and salt. Take vitamin and mineral supplements as recommended by your health care provider. Do not drink alcohol if your health care provider tells you not to drink. If you drink alcohol: Limit how much you have to 0-2 drinks a day. Know how much alcohol is in your drink. In the [place] one drink equals one 12 oz bottle of beer (355 mL), one 5 oz glass of wine (148 mL), or one 1 oz glass of hard liquor (44 mL). Lifestyle Brush your teeth every morning and night with fluoride toothpaste. Floss one time each day. Exercise for at least 30 minutes 5 or more days each week. Do not use any products that contain nicotine or tobacco. These products include cigarettes, chewing tobacco, and vaping devices, such as e-cigarettes. If you need help quitting, ask your health care provider. Do not use drugs. If you are sexually active, practice safe sex. Use a condom or other form of protection to prevent STIs. Take aspirin only as told by your health care provider. Make sure that you understand how much to take and what form to take. Work with your health care provider to find out whether it is safe and beneficial for you to take aspirin daily. Ask your health care provider if you need to take a cholesterol-lowering medicine (statin). Find healthy ways to manage stress, such as: Meditation, yoga, or  listening to music. Journaling. Talking to a trusted person. Spending time with friends and family. Safety Always wear your seat belt while driving or riding in a vehicle. Do not drive: If you have been drinking alcohol. Do not ride with someone who has been drinking. When you are tired or distracted. While texting. If you have been using any mind-altering substances or drugs. Wear a helmet and other protective equipment during sports activities. If you have firearms in your house, make sure you follow all gun safety procedures. Minimize exposure to UV radiation to reduce your risk of skin cancer. What's next? Visit your health care provider once a year for an annual wellness visit. Ask your health care provider how often you should have your eyes and teeth checked. Stay up to date on all vaccines. This information is not intended to replace advice given to you by your health care provider. Make sure you discuss any questions you have with your health care provider. Document Revised: 03/09/2021 Document Reviewed: 03/09/2021 Elsevier Patient Education  2024 [employer]. Understanding Your Risk for Falls Millions of people have serious injuries from falls each year. It is important to understand your risk of falling. Talk with your health care provider about your  risk and what you can do to lower it. If you do have a serious fall, make sure to tell your provider. Falling once raises your risk of falling again. How can falls affect me? Serious injuries from falls are common. These include: Broken bones, such as hip fractures. Head injuries, such as traumatic brain injuries (TBI) or concussions. A fear of falling can cause you to avoid activities and stay at home. This can make your muscles weaker and raise your risk for a fall. What can increase my risk? There are a number of risk factors that increase your risk for falling. The more risk factors you have, the higher your risk of  falling. Serious injuries from a fall happen most often to people who are older than 67 years old. Teenagers and young adults ages 48-29 are also at higher risk. Common risk factors include: Weakness in the lower body. Being generally weak or confused due to long-term (chronic) illness. Dizziness or balance problems. Poor vision. Medicines that cause dizziness or drowsiness. These may include: Medicines for your blood pressure, heart, anxiety, insomnia, or swelling (edema). Pain medicines. Muscle relaxants. Other risk factors include: Drinking alcohol. Having had a fall in the past. Having foot pain or wearing improper footwear. Working at a dangerous job. Having any of the following in your home: Tripping hazards, such as floor clutter or loose rugs. Poor lighting. Pets. Having dementia or memory loss. What actions can I take to lower my risk of falling?     Physical activity Stay physically fit. Do strength and balance exercises. Consider taking a regular class to build strength and balance. Yoga and tai chi are good options. Vision Have your eyes checked every year and your prescription for glasses or contacts updated as needed. Shoes and walking aids Wear non-skid shoes. Wear shoes that have rubber soles and low heels. Do not wear high heels. Do not walk around the house in socks or slippers. Use a cane or walker as told by your provider. Home safety Attach secure railings on both sides of your stairs. Install grab bars for your bathtub, shower, and toilet. Use a non-skid mat in your bathtub or shower. Attach bath mats securely with double-sided, non-slip rug tape. Use good lighting in all rooms. Keep a flashlight near your bed. Make sure there is a clear path from your bed to the bathroom. Use night-lights. Do not use throw rugs. Make sure all carpeting is taped or tacked down securely. Remove all clutter from walkways and stairways, including extension cords. Repair  uneven or broken steps and floors. Avoid walking on icy or slippery surfaces. Walk on the grass instead of on icy or slick sidewalks. Use ice melter to get rid of ice on walkways in the winter. Use a cordless phone. Questions to ask your health care provider Can you help me check my risk for a fall? Do any of my medicines make me more likely to fall? Should I take a vitamin D supplement? What exercises can I do to improve my strength and balance? Should I make an appointment to have my vision checked? Do I need a bone density test to check for weak bones (osteoporosis)? Would it help to use a cane or a walker? Where to find more information Centers for Disease Control and Prevention, STEADI: TonerPromos.no Community-Based Fall Prevention Programs: TonerPromos.no [employer] on Aging: BaseRingTones.pl Contact a health care provider if: You fall at home. You are afraid of falling at home. You feel weak, drowsy,  or dizzy. This information is not intended to replace advice given to you by your health care provider. Make sure you discuss any questions you have with your health care provider. Document Revised: 05/15/2022 Document Reviewed: 05/15/2022 Elsevier Patient Education  2024 Elsevier Inc. Steps to Quit Smoking Smoking tobacco is the leading cause of preventable death. It can affect almost every organ in the body. Smoking puts you and people around you at risk for many serious, long-lasting (chronic) diseases. Quitting smoking can be hard, but it is one of the best things that you can do for your health. It is never too late to quit. Do not give up if you cannot quit the first time. Some people need to try many times to quit. Do your best to stick to your quit plan, and talk with your doctor if you have any questions or concerns. How do I get ready to quit? Pick a date to quit. Set a date within the next 2 weeks to give you time to prepare. Write down the reasons why you are quitting. Keep this list  in places where you will see it often. Tell your family, friends, and co-workers that you are quitting. Their support is important. Talk with your doctor about the choices that may help you quit. Find out if your health insurance will pay for these treatments. Know the people, places, things, and activities that make you want to smoke (triggers). Avoid them. What first steps can I take to quit smoking? Throw away all cigarettes at home, at work, and in your car. Throw away the things that you use when you smoke, such as ashtrays and lighters. Clean your car. Empty the ashtray. Clean your home, including curtains and carpets. What can I do to help me quit smoking? Talk with your doctor about taking medicines and seeing a counselor. You are more likely to succeed when you do both. If you are pregnant or breastfeeding: Talk with your doctor about counseling or other ways to quit smoking. Do not take medicine to help you quit smoking unless your doctor tells you to. Quit right away Quit smoking completely, instead of slowly cutting back on how much you smoke over a period of time. Stopping smoking right away may be more successful than slowly quitting. Go to counseling. In-person is best if this is an option. You are more likely to quit if you go to counseling sessions regularly. Take medicine You may take medicines to help you quit. Some medicines need a prescription, and some you can buy over-the-counter. Some medicines may contain a drug called nicotine to replace the nicotine in cigarettes. Medicines may: Help you stop having the desire to smoke (cravings). Help to stop the problems that come when you stop smoking (withdrawal symptoms). Your doctor may ask you to use: Nicotine patches, gum, or lozenges. Nicotine inhalers or sprays. Non-nicotine medicine that you take by mouth. Find resources Find resources and other ways to help you quit smoking and remain smoke-free after you quit. They  include: Online chats with a Veterinary surgeon. Phone quitlines. Printed Materials engineer. Support groups or group counseling. Text messaging programs. Mobile phone apps. Use apps on your mobile phone or tablet that can help you stick to your quit plan. Examples of free services include Quit Guide from the CDC and smokefree.gov  What can I do to make it easier to quit?  Talk to your family and friends. Ask them to support and encourage you. Call a phone quitline, such as  1-800-QUIT-NOW, reach out to support groups, or work with a Veterinary surgeon. Ask people who smoke to not smoke around you. Avoid places that make you want to smoke, such as: Bars. Parties. Smoke-break areas at work. Spend time with people who do not smoke. Lower the stress in your life. Stress can make you want to smoke. Try these things to lower stress: Getting regular exercise. Doing deep-breathing exercises. Doing yoga. Meditating. What benefits will I see if I quit smoking? Over time, you may have: A better sense of smell and taste. Less coughing and sore throat. A slower heart rate. Lower blood pressure. Clearer skin. Better breathing. Fewer sick days. Summary Quitting smoking can be hard, but it is one of the best things that you can do for your health. Do not give up if you cannot quit the first time. Some people need to try many times to quit. When you decide to quit smoking, make a plan to help you succeed. Quit smoking right away, not slowly over a period of time. When you start quitting, get help and support to keep you smoke-free. This information is not intended to replace advice given to you by your health care provider. Make sure you discuss any questions you have with your health care provider. Document Revised: 09/02/2021 Document Reviewed: 09/02/2021 Elsevier Patient Education  2024 [employer].

## 2023-07-03 ENCOUNTER — Telehealth: Payer: Self-pay | Admitting: *Deleted

## 2023-07-03 NOTE — Progress Notes (Signed)
  Care Coordination   Note   07/03/2023 Name: Jeffrey Krueger MRN: [id number] DOB: 1956-06-22  Jeffrey Krueger is a 67 y.o. year old male who sees Anabel Halon, MD for primary care. I reached out to [name] by phone today to offer care coordination services.  [name] was given information about Care Coordination services today including:   The Care Coordination services include support from the care team which includes your Nurse Coordinator, Clinical Social Worker, or Pharmacist.  The Care Coordination team is here to help remove barriers to the health concerns and goals most important to you. Care Coordination services are voluntary, and the patient may decline or stop services at any time by request to their care team member.   Care Coordination Consent Status: Patient agreed to services and verbal consent obtained.   Follow up plan:  Telephone appointment with care coordination team member scheduled for:  07/10/23  Encounter Outcome:  Patient Scheduled  [facility] [address] Coordination Care Guide  Direct Dial: [phone]

## 2023-07-04 ENCOUNTER — Telehealth: Payer: Self-pay | Admitting: Emergency Medicine

## 2023-07-04 NOTE — Telephone Encounter (Signed)
PT calling wanting to change from [employer] to [employer] for his 02 because he is dissatisfied w/[employer]. Please call him @ [phone]  [employer] needs to bring him out an emergency tank in case he loses power. Something that will last from 8-10 hours.

## 2023-07-08 NOTE — Telephone Encounter (Signed)
Left message for patient to call back  

## 2023-07-09 NOTE — Addendum Note (Signed)
Addended byTrena Platt on: 07/09/2023 05:59 PM   Modules accepted: Orders

## 2023-07-10 ENCOUNTER — Other Ambulatory Visit: Payer: Self-pay

## 2023-07-10 ENCOUNTER — Ambulatory Visit: Payer: Self-pay | Admitting: Licensed Clinical Social Worker

## 2023-07-10 DIAGNOSIS — M5137 Other intervertebral disc degeneration, lumbosacral region with discogenic back pain only: Secondary | ICD-10-CM

## 2023-07-10 DIAGNOSIS — J449 Chronic obstructive pulmonary disease, unspecified: Secondary | ICD-10-CM

## 2023-07-10 DIAGNOSIS — J9611 Chronic respiratory failure with hypoxia: Secondary | ICD-10-CM

## 2023-07-10 MED ORDER — UNABLE TO FIND: 1.0000 | Freq: Every day | 0 refills | Status: AC

## 2023-07-10 MED ORDER — UNABLE TO FIND
1.0000 | Freq: Every day | 0 refills | Status: AC
Start: 2023-07-10 — End: ?

## 2023-07-10 NOTE — Patient Instructions (Signed)
Visit Information  Thank you for taking time to visit with me today. Please don't hesitate to contact me if I can be of assistance to you.   Following are the goals we discussed today:   Goals Addressed             This Visit's Progress    Care Coordination Activities       Care Coordination Interventions: Patient stated that he is waiting for the PCP to decide if a scooter will be ordered and if it is then he will need a ramp at his apartment, Patient stated that he is waiting for approval from his landlord. SW educated the patient on the [facility] [employer] [employer] if the approval is done. The SW will contact the PCP in regards to the scooter The patient does not have any food insecurities, transportation or utility issues at this time. Sw informed the patient that if any changes occur.        Our next appointment is by telephone on 07/24/2023 at 11:00am  Please call the care guide team at [phone] if you need to cancel or reschedule your appointment.   If you are experiencing a Mental Health or Behavioral Health Crisis or need someone to talk to, please call the Suicide and Crisis Lifeline: 988 go to [facility] [address], [place] [phone]) call the [facility] Crisis Line: [phone]  Patient verbalizes understanding of instructions and care plan provided today and agrees to view in MyChart. Active MyChart status and patient understanding of how to access instructions and care plan via MyChart confirmed with patient.     Jeanie Cooks, PhD [facility], [facility] Social Worker Direct Dial: [phone]  Fax: [phone]

## 2023-07-10 NOTE — Patient Outreach (Signed)
  Care Coordination   Initial Visit Note   07/10/2023 Name: Jeffrey Krueger MRN: [id number] DOB: 1955-11-11  Jeffrey Krueger is a 67 y.o. year old male who sees Anabel Halon, MD for primary care. I spoke with  [name] by phone today.  What matters to the patients health and wellness today?  Needed medical supplies (raised toilet  and grab bar) and a possible ramp for his place of residence.     Goals Addressed             This Visit's Progress    Care Coordination Activities       Care Coordination Interventions: Patient stated that he is waiting for the PCP to decide if a scooter will be ordered and if it is then he will need a ramp at his apartment, Patient stated that he is waiting for approval from his landlord. SW educated the patient on the [facility] [employer] if the approval is done. The SW will contact the PCP in regards to the scooter The patient does not have any food insecurities, transportation or utility issues at this time. Sw informed the patient that if any changes occur.        SDOH assessments and interventions completed:  Yes  SDOH Interventions Today    Flowsheet Row Most Recent Value  SDOH Interventions   Food Insecurity Interventions Intervention Not Indicated  [Patient does nto have any current issues with food at this time and recieves a food card through [employer]]  Housing Interventions Intervention Not Indicated  [No current issues at this time]  Transportation Interventions Intervention Not Indicated  [No issues at this time and likes for his son or daugher to take him, SW did tell the patient that if he has any issues in the future]  Utilities Interventions Intervention Not Indicated  [Patient does not have any issues as this time he pays his bills online]        Care Coordination Interventions:  Yes, provided  Interventions Today    Flowsheet Row Most Recent Value  General Interventions   General Interventions Discussed/Reviewed  General Interventions Discussed, [employer], Horticulturist, commercial (DME)  [SW Discussed with the patient about the ramp and the raised toilet and grab bar. the SW will contact the PCP in regards to the scooter and the order for the raised toilet. the SW also educated the patient about [facility] for [facility] ramp]        Follow up plan: Follow up call scheduled for 07/24/2023 at 11:00am    Encounter Outcome:  Patient Visit Completed   Jeanie Cooks, PhD [facility], [facility] Social Worker Direct Dial: [phone]  Fax: [phone]

## 2023-07-19 ENCOUNTER — Ambulatory Visit: Payer: Medicare HMO | Admitting: Internal Medicine

## 2023-07-19 ENCOUNTER — Encounter: Payer: Self-pay | Admitting: Internal Medicine

## 2023-07-19 VITALS — BP 124/72 | HR 82 | Ht 69.0 in | Wt 138.0 lb

## 2023-07-19 DIAGNOSIS — Z23 Encounter for immunization: Secondary | ICD-10-CM | POA: Diagnosis not present

## 2023-07-19 DIAGNOSIS — K2950 Unspecified chronic gastritis without bleeding: Secondary | ICD-10-CM

## 2023-07-19 DIAGNOSIS — E440 Moderate protein-calorie malnutrition: Secondary | ICD-10-CM

## 2023-07-19 DIAGNOSIS — F3341 Major depressive disorder, recurrent, in partial remission: Secondary | ICD-10-CM | POA: Diagnosis not present

## 2023-07-19 DIAGNOSIS — J9611 Chronic respiratory failure with hypoxia: Secondary | ICD-10-CM

## 2023-07-19 DIAGNOSIS — J449 Chronic obstructive pulmonary disease, unspecified: Secondary | ICD-10-CM

## 2023-07-19 DIAGNOSIS — R5382 Chronic fatigue, unspecified: Secondary | ICD-10-CM

## 2023-07-19 MED ORDER — TRAZODONE HCL 100 MG PO TABS: 100.0000 mg | ORAL_TABLET | Freq: Every day | ORAL | 1 refills | Status: DC

## 2023-07-19 NOTE — Patient Instructions (Signed)
Please start taking Trazodone 100 mg at bedtime for insomnia.  Please continue to take medications as prescribed.  Please continue to follow low salt diet and ambulate as tolerated.

## 2023-07-19 NOTE — Assessment & Plan Note (Addendum)
Overall well controlled with Wellbutrin 200 mg BID, but still sometimes takes it QD On trazodone 75 mg nightly, increased dose to 100 mg nightly due to persistent insomnia

## 2023-07-19 NOTE — Assessment & Plan Note (Signed)
Has been 2-3 l O2 at home since hospitalization for COPD exacerbation in 08/2020 Underlying COPD and continuous tobacco abuse Albuterol nebulizer PRN, supplies sent  Has limited mobility due to chronic hypoxic respiratory failure, needs wheelchair - further information under physical deconditioning

## 2023-07-19 NOTE — Progress Notes (Signed)
Established Patient Office Visit  Subjective:  Patient ID: Jeffrey Krueger, male    DOB: [birth year]  Age: 67 y.o. MRN: [id number]  CC:  Chief Complaint  Patient presents with   chronic fatigue    Follow up    Anxiety    Follow up     HPI Jeffrey Krueger is a 67 y.o. male with past medical history of COPD, hypoxic respiratory failure, anxiety and tobacco abuse who presents for f/u of his chronic medical conditions.  He has been using Breztri and as needed albuterol for COPD.  He follows up with Pulmonology for COPD and chronic hypoxic respiratory failure. He has continuous home O2 for it now.  He still smokes about 0.5 pack/day.  He is on oral prednisone for chronic hypoxia.  He complains of daytime fatigue.  He currently takes Wellbutrin 200 mg BID for GAD and trazodone 75 mg nightly for insomnia.  His dose of trazodone was decreased to 50 mg qHS in the last visit due to daytime drowsiness.  He is taking 1.5 tablets currently, but still has insomnia.  He denies any anhedonia.  He has anxiety at nighttime when he has to use O2 while sleeping. He denies any SI or HI currently.  He has difficulty ambulating due to chronic hypoxia and generalized OA, DDD of lumbar spine.  He has rolling walker currently, but needs help most of the time for proper ambulation.  He has wrist and hand joint arthritis as well, which limits his ability to operate manual wheelchair. He is able to safely transfer to and from a power operated vehicle, operate the tiller steering system and maintain postural stability and position while operating the POV in the home. His home has enough space for adequate access between rooms for using POV. He would benefit from POV/Scooter to improve his independence with MRALDs. He also needs to raised toilet seat and hand rails to prevent fall in the bathroom.   He needs a letter for building a ramp at his living facility for his proper ambulation, which has been provided in the last  visit.     Past Medical History:  Diagnosis Date   COPD (chronic obstructive pulmonary disease) (HCC)    Headache(784.0)    Shortness of breath     Past Surgical History:  Procedure Laterality Date   BACK SURGERY      History reviewed. No pertinent family history.  Social History   Socioeconomic History   Marital status: Married    Spouse name: Not on file   Number of children: Not on file   Years of education: Not on file   Highest education level: Not on file  Occupational History   Not on file  Tobacco Use   Smoking status: Every Day    Current packs/day: 0.50    Average packs/day: 0.5 packs/day for 49.4 years (24.7 ttl pk-yrs)    Types: Cigarettes    Start date: 02/06/1974   Smokeless tobacco: Never   Tobacco comments:     7-8 cigarettes a day updated 06/07/2023 amy marsh, cma  Substance and Sexual Activity   Alcohol use: No    Alcohol/week: 0.0 standard drinks of alcohol   Drug use: No   Sexual activity: Not on file  Other Topics Concern   Not on file  Social History Narrative   Not on file   Social Determinants of Health   Financial Resource Strain: Low Risk  (07/02/2023)   Overall Physicist, medical Strain (  CARDIA)    Difficulty of Paying Living Expenses: Not hard at all  Food Insecurity: No Food Insecurity (07/10/2023)   Hunger Vital Sign    Worried About Running Out of Food in the Last Year: Never true    Ran Out of Food in the Last Year: Never true  Transportation Needs: No Transportation Needs (07/10/2023)   PRAPARE - Administrator, Civil Service (Medical): No    Lack of Transportation (Non-Medical): No  Physical Activity: Patient Declined (07/02/2023)   Exercise Vital Sign    Days of Exercise per Week: Patient declined    Minutes of Exercise per Session: Patient declined  Stress: Stress Concern Present (07/02/2023)   [name] of Occupational Health - Occupational Stress Questionnaire    Feeling of Stress : Rather much   Social Connections: Socially Isolated (07/02/2023)   Social Connection and Isolation Panel [NHANES]    Frequency of Communication with Friends and Family: More than three times a week    Frequency of Social Gatherings with Friends and Family: More than three times a week    Attends Religious Services: Never    Database administrator or Organizations: No    Attends Banker Meetings: Never    Marital Status: Separated  Intimate Partner Violence: Not At Risk (07/02/2023)   Humiliation, Afraid, Rape, and Kick questionnaire    Fear of Current or Ex-Partner: No    Emotionally Abused: No    Physically Abused: No    Sexually Abused: No    Outpatient Medications Prior to Visit  Medication Sig Dispense Refill   albuterol (PROVENTIL) (2.5 MG/3ML) 0.083% nebulizer solution INHALE CONTENTS OF 1 VIAL IN NEBULIZER EVERY 4 HOURS IF NEEDED FOR WHEEZING 450 mL 3   albuterol (VENTOLIN HFA) 108 (90 Base) MCG/ACT inhaler INHALE 2 PUFFS BY MOUTH EVERY 4 HOURS AS NEEDED 18 each 5   BREZTRI AEROSPHERE 160-9-4.8 MCG/ACT AERO INHALE 2 PUFFS INTO THE LUNGS IN THE MORNING AND AT BEDTIME. 10.7 each 11   buPROPion (WELLBUTRIN SR) 200 MG 12 hr tablet TAKE 1 TABLET BY MOUTH TWICE A DAY (Patient taking differently: Take 200 mg by mouth 2 (two) times daily. Patient taking once a day.) 180 tablet 1   fluticasone (FLONASE) 50 MCG/ACT nasal spray SPRAY 2 SPRAYS INTO EACH NOSTRIL EVERY DAY 48 mL 0   Olopatadine HCl 0.2 % SOLN Apply 1 drop to eye daily. 2.5 mL 0   omeprazole (PRILOSEC) 40 MG capsule TAKE 1 CAPSULE (40 MG TOTAL) BY MOUTH DAILY. 90 capsule 3   predniSONE (DELTASONE) 20 MG tablet Take 1 tablet (20 mg total) by mouth daily with breakfast. 90 tablet 3   Respiratory Therapy Supplies (NEBULIZER AIR TUBE/PLUGS) MISC For Albuterol nebulization. 1 each 0   Respiratory Therapy Supplies (NEBULIZER MASK ADULT) MISC For Albuterol nebulization. 1 each 0   UNABLE TO FIND 1 each by Does not apply route daily. Med  Name: Raised Toilet Seat 1 each 0   UNABLE TO FIND 1 each by Does not apply route daily. Med Name: International aid/development worker for Bathroom 1 each 0   traZODone (DESYREL) 50 MG tablet Take 0.5-1 tablets (25-50 mg total) by mouth at bedtime as needed for sleep. 30 tablet 5   No facility-administered medications prior to visit.    No Known Allergies  ROS Review of Systems  Constitutional:  Positive for fatigue. Negative for chills and fever.  HENT:  Negative for congestion and sore throat.   Eyes:  Negative  for pain and discharge.  Respiratory:  Positive for cough, shortness of breath and wheezing.   Cardiovascular:  Negative for chest pain and palpitations.  Gastrointestinal:  Negative for diarrhea, nausea and vomiting.  Endocrine: Negative for polydipsia and polyuria.  Genitourinary:  Negative for dysuria and hematuria.  Musculoskeletal:  Positive for arthralgias, back pain and neck pain. Negative for neck stiffness.  Skin:  Negative for rash.  Neurological:  Negative for dizziness, weakness, numbness and headaches.  Psychiatric/Behavioral:  Positive for sleep disturbance. Negative for agitation and behavioral problems. The patient is nervous/anxious.       Objective:    Physical Exam Vitals reviewed.  Constitutional:      General: He is not in acute distress.    Appearance: He is not diaphoretic.     Comments: Has rolling walker  HENT:     Head: Normocephalic and atraumatic.     Nose: Nose normal.     Mouth/Throat:     Mouth: Mucous membranes are moist.  Eyes:     General: No scleral icterus.    Extraocular Movements: Extraocular movements intact.  Cardiovascular:     Rate and Rhythm: Normal rate and regular rhythm.     Pulses: Normal pulses.     Heart sounds: Normal heart sounds. No murmur heard. Pulmonary:     Breath sounds: No wheezing or rales.     Comments: O2 - 3 lpm Musculoskeletal:     Right wrist: Bony tenderness present. Decreased range of motion.     Left wrist: Bony  tenderness present. Decreased range of motion.     Right hand: Tenderness present.     Left hand: Tenderness present.     Cervical back: Neck supple. No tenderness.     Right lower leg: No edema.     Left lower leg: No edema.  Skin:    General: Skin is warm.     Findings: No rash.  Neurological:     General: No focal deficit present.     Mental Status: He is alert and oriented to person, place, and time.     Sensory: No sensory deficit.     Motor: Weakness (3/5 - b/l LE) present.  Psychiatric:        Mood and Affect: Mood normal.        Behavior: Behavior normal.     BP 124/72 (BP Location: Left Arm)   Pulse 82   Ht 5\' 9"  (1.753 m)   Wt 138 lb (62.6 kg)   SpO2 98%   BMI 20.38 kg/m  Wt Readings from Last 3 Encounters:  07/19/23 138 lb (62.6 kg)  07/02/23 138 lb (62.6 kg)  06/07/23 132 lb 12.8 oz (60.2 kg)    Lab Results  Component Value Date   TSH 1.260 04/19/2023   Lab Results  Component Value Date   WBC 10.0 04/19/2023   HGB 13.6 04/19/2023   HCT 42.2 04/19/2023   MCV 94 04/19/2023   PLT 264 04/19/2023   Lab Results  Component Value Date   NA 141 04/19/2023   K 3.6 04/19/2023   CO2 32 (H) 04/19/2023   GLUCOSE 79 04/19/2023   BUN 18 04/19/2023   CREATININE 0.95 04/19/2023   BILITOT 0.8 04/19/2023   ALKPHOS 60 04/19/2023   AST 13 04/19/2023   ALT 12 04/19/2023   PROT 5.1 (L) 04/19/2023   ALBUMIN 3.7 (L) 04/19/2023   CALCIUM 8.8 04/19/2023   ANIONGAP 10 07/07/2016   EGFR 88 04/19/2023  Lab Results  Component Value Date   CHOL 163 04/19/2023   Lab Results  Component Value Date   HDL 52 04/19/2023   Lab Results  Component Value Date   LDLCALC 99 04/19/2023   Lab Results  Component Value Date   TRIG 62 04/19/2023   Lab Results  Component Value Date   CHOLHDL 3.1 04/19/2023   Lab Results  Component Value Date   HGBA1C 5.4 04/19/2023      Assessment & Plan:   Problem List Items Addressed This Visit       Respiratory   COPD  (chronic obstructive pulmonary disease) (HCC)    Overall stable On Breztri and as needed albuterol Also uses albuterol neb On oral steroids now Followed by Pulmonology      Chronic respiratory failure with hypoxia (HCC) - Primary    Has been 2-3 l O2 at home since hospitalization for COPD exacerbation in 08/2020 Underlying COPD and continuous tobacco abuse Albuterol nebulizer PRN, supplies sent  Has limited mobility due to chronic hypoxic respiratory failure, needs wheelchair - further information under physical deconditioning        Digestive   Chronic gastritis without bleeding    On omeprazole for gastritis PPx as he is on chronic oral steroids for COPD        Other   Moderate malnutrition (HCC)    Has hypoalbuminemia and muscle wasting Currently wheelchair dependent due to DDD of lumbosacral area, OA of knee and chronic hypoxic respiratory failure Needs to maintain adequate hydration and eat at regular intervals Chronic oral steroids can also contribute to muscle wasting Advised to take protein supplement      Recurrent major depressive disorder, in partial remission (HCC)    Overall well controlled with Wellbutrin 200 mg BID, but still sometimes takes it QD On trazodone 75 mg nightly, increased dose to 100 mg nightly due to persistent insomnia      Relevant Medications   traZODone (DESYREL) 100 MG tablet   Chronic fatigue    Multifactorial - due to COPD, oxygen dependent, chronic oral steroid use, MDD, etc. Continue vitamin D and start B complex supplement CBC and TSH overall unremarkable      Other Visit Diagnoses     Encounter for immunization       Relevant Orders   Flu Vaccine Trivalent High Dose (Fluad) (Completed)         Meds ordered this encounter  Medications   traZODone (DESYREL) 100 MG tablet    Sig: Take 1 tablet (100 mg total) by mouth at bedtime.    Dispense:  90 tablet    Refill:  1    Dose change    Follow-up: Return in about 6  months (around 01/17/2024) for COPD.    Anabel Halon, MD

## 2023-07-19 NOTE — Assessment & Plan Note (Addendum)
Multifactorial - due to COPD, oxygen dependent, chronic oral steroid use, MDD, etc. Continue vitamin D and start B complex supplement CBC and TSH overall unremarkable

## 2023-07-19 NOTE — Assessment & Plan Note (Signed)
Overall stable On Breztri and as needed albuterol Also uses albuterol neb On oral steroids now Followed by Pulmonology

## 2023-07-19 NOTE — Assessment & Plan Note (Signed)
On omeprazole for gastritis PPx as he is on chronic oral steroids for COPD

## 2023-07-20 ENCOUNTER — Encounter: Payer: Self-pay | Admitting: Internal Medicine

## 2023-07-20 DIAGNOSIS — J449 Chronic obstructive pulmonary disease, unspecified: Secondary | ICD-10-CM | POA: Diagnosis not present

## 2023-07-20 DIAGNOSIS — J9611 Chronic respiratory failure with hypoxia: Secondary | ICD-10-CM | POA: Diagnosis not present

## 2023-07-20 NOTE — Assessment & Plan Note (Signed)
Has hypoalbuminemia and muscle wasting Currently wheelchair dependent due to DDD of lumbosacral area, OA of knee and chronic hypoxic respiratory failure Needs to maintain adequate hydration and eat at regular intervals Chronic oral steroids can also contribute to muscle wasting Advised to take protein supplement

## 2023-07-24 ENCOUNTER — Ambulatory Visit: Payer: Self-pay | Admitting: Licensed Clinical Social Worker

## 2023-07-24 NOTE — Patient Outreach (Signed)
  Care Coordination   Follow Up Visit Note   07/24/2023 Name: Jeffrey Krueger MRN: [id number] DOB: December 22, 1955  Jeffrey Krueger is a 67 y.o. year old male who sees Anabel Halon, MD for primary care. I spoke with  [name] by phone today.  What matters to the patients health and wellness today?  Grab bar and toilet seat follow and patient has concerns about his new oxygen, and letter for a grab bar for the home.     Goals Addressed             This Visit's Progress    Care Coordination Activities       Care Coordination Interventions: Patient need the nurse to contact him regarding a new order for his oxygen from [employer] and a letter for a grab bar,the patient stated that his son is going to transport him to the PCP appointment on 08/22/2023, the patient that he did receive a raised toilet seat from [employer]. The SW will follow up with patient in about 2 weeks, per the patient request.        SDOH assessments and interventions completed:  Yes  SDOH Interventions Today    Flowsheet Row Most Recent Value  SDOH Interventions   Food Insecurity Interventions Intervention Not Indicated  Housing Interventions Intervention Not Indicated  Transportation Interventions Intervention Not Indicated, Other (Comment)  [Patient stated that his sone will transport him to the doctors appointment on 08/22/2023]  Utilities Interventions Intervention Not Indicated        Care Coordination Interventions:  Yes, provided  Interventions Today    Flowsheet Row Most Recent Value  General Interventions   General Interventions Discussed/Reviewed General Interventions Discussed, [employer], Horticulturist, commercial (DME)  [Patient needs the nurse from the PCP to call regarding his oxyen and letter for a grab bar]        Follow up plan:  08/06/2023 at 1:00pm    Encounter Outcome:  Patient Visit Completed   Jeanie Cooks, PhD [facility], [facility] Social Worker Direct Dial: [phone]  Fax: [phone]

## 2023-07-24 NOTE — Patient Instructions (Signed)
Visit Information  Thank you for taking time to visit with me today. Please don't hesitate to contact me if I can be of assistance to you.   Following are the goals we discussed today:   Goals Addressed             This Visit's Progress    Care Coordination Activities       Care Coordination Interventions: Patient need the nurse to contact him regarding a new order for his oxygen from [employer] and a letter for a grab bar,the patient stated that his son is going to transport him to the PCP appointment on 08/22/2023, the patient that he did receive a raised toilet seat from [employer]. The SW will follow up with patient in about 2 weeks, per the patient request.        Our next appointment is by telephone on 08/06/2023 at 1:00 pm  Please call the care guide team at [phone] if you need to cancel or reschedule your appointment.   If you are experiencing a Mental Health or Behavioral Health Crisis or need someone to talk to, please call the Suicide and Crisis Lifeline: 988 go to [facility] Urgent [facility] [address], [place] [phone]) call 911  Patient verbalizes understanding of instructions and care plan provided today and agrees to view in MyChart. Active MyChart status and patient understanding of how to access instructions and care plan via MyChart confirmed with patient.     Jeanie Cooks, PhD [facility], [facility] Social Worker Direct Dial: [phone]  Fax: [phone]

## 2023-07-30 ENCOUNTER — Telehealth: Payer: Self-pay

## 2023-07-30 NOTE — Telephone Encounter (Signed)
-----   Message from [facility] Morgan H sent at 07/26/2023  1:58 PM EDT ----- Can you please take care of this, we dont handle his oxygen ----- Message ----- From: Anabel Halon, MD Sent: 07/24/2023  12:06 PM EDT To: Jacklynn Barnacle, CMA   ----- Message ----- From: Remigio Eisenmenger D Sent: 07/24/2023  12:04 PM EDT To: Anabel Halon, MD  Patient would like a call from the nurse to request a letter for a grab and to discuss about the oxygen that he is receiving from [employer] and if a new order can be written to start receiving the oxygen he was getting before e he is not happy with the new one.

## 2023-07-30 NOTE — Telephone Encounter (Signed)
Called patient.  Patient states he is unhappy with [employer] handling his oxygen.  He wants to switch back to [employer].  Patient has [facility] HMO and Medicaid [place].  Patient will need to stay with [employer] due to [place] has a contract with [employer]. Informed patient I would reach out to our rep, Mickeal Needy and have him contact the patient to find a resolution regarding any problems Mr. Croghan may be having with [employer].

## 2023-08-06 ENCOUNTER — Telehealth: Payer: Self-pay | Admitting: Internal Medicine

## 2023-08-06 ENCOUNTER — Ambulatory Visit: Payer: Self-pay | Admitting: Licensed Clinical Social Worker

## 2023-08-06 NOTE — Patient Outreach (Signed)
  Care Coordination   Follow Up Visit Note   08/06/2023 Name: Jeffrey Krueger MRN: [id number] DOB: 27-Dec-1955  Jeffrey Krueger is a 67 y.o. year old male who sees Anabel Halon, MD for primary care. I spoke with  [name] by phone today.  What matters to the patients health and wellness today?  Patient will be ok with a suction grab bar and the patient has an upcoming appt with the pulmonologist and going to see if they can write an order for it and will also discuss his oxygen conerns with the, Sw will also in box the PCP.     Goals Addressed   None     SDOH assessments and interventions completed:  Yes  SDOH Interventions Today    Flowsheet Row Most Recent Value  SDOH Interventions   Food Insecurity Interventions Intervention Not Indicated  Housing Interventions Intervention Not Indicated  Transportation Interventions Intervention Not Indicated  Utilities Interventions Intervention Not Indicated  Financial Strain Interventions Other (Comment)  [Patient does not have any insurance and will do the walk in [place] clinic on 08/27/2023]  Health Literacy Interventions Intervention Not Indicated        Care Coordination Interventions:  Yes, provided  Interventions Today    Flowsheet Row Most Recent Value  General Interventions   General Interventions Discussed/Reviewed General Interventions Discussed, [employer] will be ok with a suction grab bar and the patient has an upcoming appt with the pulmonologist and going to see if they can write an order for it and will also discuss his oxygen conerns with the, Sw will also in box the PCP.]        Follow up plan: Follow up call scheduled for 08/31/2023 at 1:00 pm    Encounter Outcome:  Patient Visit Completed   Jeanie Cooks, PhD [facility], [facility] Social Worker Direct Dial: [phone]  Fax: [phone]

## 2023-08-06 NOTE — Telephone Encounter (Signed)
Patient called out office waiting on a call from his visit today at 1:00 pm.

## 2023-08-06 NOTE — Patient Instructions (Signed)
Visit Information  Thank you for taking time to visit with me today. Please don't hesitate to contact me if I can be of assistance to you.   Following are the goals we discussed today:   Goals Addressed   None     Our next appointment is by telephone on 08/31/2023 at 1:00 pm  Please call the care guide team at [phone] if you need to cancel or reschedule your appointment.   If you are experiencing a Mental Health or Behavioral Health Crisis or need someone to talk to, please call the Suicide and Crisis Lifeline: 988 go to [facility] Urgent [facility] [address], [place] [phone]) call 911  Patient verbalizes understanding of instructions and care plan provided today and agrees to view in MyChart. Active MyChart status and patient understanding of how to access instructions and care plan via MyChart confirmed with patient.     Jeanie Cooks, PhD [facility], [facility] Social Worker Direct Dial: [phone]  Fax: [phone]

## 2023-08-07 ENCOUNTER — Encounter: Payer: Self-pay | Admitting: Adult Health

## 2023-08-07 ENCOUNTER — Ambulatory Visit (INDEPENDENT_AMBULATORY_CARE_PROVIDER_SITE_OTHER): Payer: Medicare HMO | Admitting: Adult Health

## 2023-08-07 VITALS — BP 128/62 | HR 85 | Temp 97.8°F | Ht 69.0 in | Wt 138.0 lb

## 2023-08-07 DIAGNOSIS — Z720 Tobacco use: Secondary | ICD-10-CM | POA: Diagnosis not present

## 2023-08-07 DIAGNOSIS — J9611 Chronic respiratory failure with hypoxia: Secondary | ICD-10-CM

## 2023-08-07 DIAGNOSIS — J449 Chronic obstructive pulmonary disease, unspecified: Secondary | ICD-10-CM

## 2023-08-07 NOTE — Progress Notes (Signed)
@Patient  ID: Jeffrey Krueger, male    DOB: January 01, 1956, 67 y.o.   MRN: [id number]  Chief Complaint  Patient presents with   Follow-up    Referring provider: Anabel Halon, MD Discussed the use of AI scribe software for clinical note transcription with the patient, who gave verbal consent to proceed  HPI: 67 year old male active smoker followed for COPD and chronic respiratory failure  TEST/EVENTS :  CT chest November 02, 2022 stable apical right upper lobe nodule significantly decreased in size, no acute pulmonary disease  08/07/2023 Follow up : COPD and O2 RF  Patient presents for 57-month follow-up.  He is followed for severe COPD.  He is on oxygen 3 L.  He remains on Breztri twice daily.  Chronic steroids with prednisone 20 mg daily.  Uses albuterol inhaler or nebulizer at least 3 times a day. We discussed smoking cessation in detail. Despite understanding the risks, the patient admits to ongoing smoking, with a current consumption of about seven to eight cigarettes per day. Expressed difficulty in quitting due to the addictive nature of smoking and the lack of other activities in their daily routine.  Remains on oxygen 3 L.  Has been having trouble with homecare company with his oxygen regulator and concentrator at home.  Patient educated on risks associated with smoking in the presence of oxygen and assure that they remove the oxygen while smoking.     No Known Allergies  Immunization History  Administered Date(s) Administered   Fluad Quad(high Dose 65+) 10/18/2022   Fluad Trivalent(High Dose 65+) 07/19/2023   Influenza,inj,Quad PF,6+ Mos 08/30/2018, 11/02/2019, 10/27/2020   Influenza-Unspecified 06/26/2015, 07/27/2019   Pneumococcal Conjugate-13 12/08/2020   Pneumococcal Polysaccharide-23 09/05/2013, 04/17/2022    Past Medical History:  Diagnosis Date   COPD (chronic obstructive pulmonary disease) (HCC)    Headache(784.0)    Shortness of breath     Tobacco  History: Social History   Tobacco Use  Smoking Status Every Day   Current packs/day: 0.50   Average packs/day: 0.5 packs/day for 49.5 years (24.7 ttl pk-yrs)   Types: Cigarettes   Start date: 02/06/1974  Smokeless Tobacco Never  Tobacco Comments    7-8 cigarettes a day updated 06/07/2023 amy marsh, cma   Ready to quit: No Counseling given: Yes Tobacco comments:  7-8 cigarettes a day updated 06/07/2023 amy marsh, cma   Outpatient Medications Prior to Visit  Medication Sig Dispense Refill   albuterol (PROVENTIL) (2.5 MG/3ML) 0.083% nebulizer solution INHALE CONTENTS OF 1 VIAL IN NEBULIZER EVERY 4 HOURS IF NEEDED FOR WHEEZING 450 mL 3   albuterol (VENTOLIN HFA) 108 (90 Base) MCG/ACT inhaler INHALE 2 PUFFS BY MOUTH EVERY 4 HOURS AS NEEDED 18 each 5   BREZTRI AEROSPHERE 160-9-4.8 MCG/ACT AERO INHALE 2 PUFFS INTO THE LUNGS IN THE MORNING AND AT BEDTIME. 10.7 each 11   buPROPion (WELLBUTRIN SR) 200 MG 12 hr tablet TAKE 1 TABLET BY MOUTH TWICE A DAY (Patient taking differently: Take 200 mg by mouth 2 (two) times daily. Patient taking once a day.) 180 tablet 1   fluticasone (FLONASE) 50 MCG/ACT nasal spray SPRAY 2 SPRAYS INTO EACH NOSTRIL EVERY DAY 48 mL 0   Olopatadine HCl 0.2 % SOLN Apply 1 drop to eye daily. 2.5 mL 0   omeprazole (PRILOSEC) 40 MG capsule TAKE 1 CAPSULE (40 MG TOTAL) BY MOUTH DAILY. 90 capsule 3   predniSONE (DELTASONE) 20 MG tablet Take 1 tablet (20 mg total) by mouth daily with breakfast. 90 tablet  3   Respiratory Therapy Supplies (NEBULIZER AIR TUBE/PLUGS) MISC For Albuterol nebulization. 1 each 0   Respiratory Therapy Supplies (NEBULIZER MASK ADULT) MISC For Albuterol nebulization. 1 each 0   traZODone (DESYREL) 100 MG tablet Take 1 tablet (100 mg total) by mouth at bedtime. 90 tablet 1   UNABLE TO FIND 1 each by Does not apply route daily. Med Name: Raised Toilet Seat 1 each 0   UNABLE TO FIND 1 each by Does not apply route daily. Med Name: Trudee Grip for Bathroom 1 each 0    No facility-administered medications prior to visit.     Review of Systems:   Constitutional:   No  weight loss, night sweats,  Fevers, chills, +fatigue, or  lassitude.  HEENT:   No headaches,  Difficulty swallowing,  Tooth/dental problems, or  Sore throat,                No sneezing, itching, ear ache, nasal congestion, post nasal drip,   CV:  No chest pain,  Orthopnea, PND, swelling in lower extremities, anasarca, dizziness, palpitations, syncope.   GI  No heartburn, indigestion, abdominal pain, nausea, vomiting, diarrhea, change in bowel habits, loss of appetite, bloody stools.   Resp:.  No chest wall deformity  Skin: no rash or lesions.  GU: no dysuria, change in color of urine, no urgency or frequency.  No flank pain, no hematuria   MS:  No joint pain or swelling.  No decreased range of motion.  No back pain.    Physical Exam  BP 128/62 (BP Location: Right Arm, Patient Position: Sitting, Cuff Size: Normal)   Pulse 85   Temp 97.8 F (36.6 C) (Oral)   Ht 5\' 9"  (1.753 m)   Wt 138 lb (62.6 kg)   SpO2 98%   BMI 20.38 kg/m   GEN: A/Ox3; pleasant , NAD, frail, elderly, wheelchair, oxygen   HEENT:  [place]/AT,  EACs-clear, TMs-wnl, NOSE-clear, THROAT-clear, no lesions, no postnasal drip or exudate noted.   NECK:  Supple w/ fair ROM; no JVD; normal carotid impulses w/o bruits; no thyromegaly or nodules palpated; no lymphadenopathy.    RESP  Clear  P & A; w/o, wheezes/ rales/ or rhonchi. no accessory muscle use, no dullness to percussion  CARD:  RRR, no m/r/g, no peripheral edema, pulses intact, no cyanosis or clubbing.  GI:   Soft & nt; nml bowel sounds; no organomegaly or masses detected.   Musco: Warm bil, no deformities or joint swelling noted.   Neuro: alert, no focal deficits noted.    Skin: Warm, no lesions or rashes    Lab Results:  CBC      ProBNP No results found for: "PROBNP"  Imaging: No results found.  Administration History     None            No data to display          No results found for: "NITRICOXIDE"      Assessment & Plan:  Assessment and Plan    Chronic Obstructive Pulmonary Disease (COPD) Severe COPD with lung function at approximately 32%, active smoker  We discussed the importance of smoking cessation to slow progression and improve quality of life.  Continue on Breztri inhaler, two puffs in the morning and two puffs at night, an albuterol inhaler, and a nebulizer as needed. Currently, they are on prednisone 20 mg daily. We discussed the risks of prednisone, including an increased risk for diabetes and osteoporosis, and the importance of  consistent dosing. We will continue the [facility] inhaler, albuterol inhaler, and nebulizer as needed, and prednisone 20 mg daily. An order to [employer] for home care to address oxygen tank issues will be sent. We encourage smoking cessation, suggesting a gradual reduction of one cigarette per week, and follow up with their family doctor regarding steroid use and potential risks for diabetes and osteoporosis.  Smoking Cessation . We discussed the impact of smoking on COPD and overall health, emphasizing the benefits of quitting or reducing smoking. We encourage a gradual reduction of smoking by one cigarette per week and provide education on the risks of smoking with oxygen use and potential fire hazards.  General Health Maintenance They received flu and pneumonia vaccines. We discussed the RSV vaccine, recommended for older adults, especially those with respiratory conditions, and recommend the RSV vaccine, available at pharmacies.  Follow-up A follow-up appointment with Dr. Delton Coombes  is scheduled in four months.         Rubye Oaks, NP 08/07/2023

## 2023-08-07 NOTE — Addendum Note (Signed)
Addended by: Gay Filler T on: 08/07/2023 02:50 PM   Modules accepted: Orders

## 2023-08-07 NOTE — Patient Instructions (Signed)
Continue on Breztri 2 puffs Twice daily, rinse after use  Albuterol inhaler or nebulizer As needed   Continue on Prednisone 20mg  daily  We will call [employer] about your Oxygen issues.  Continue on Oxygen 3l/m  Work on quitting smoking  Follow up with Dr. Delton Coombes  in 4 months and As needed

## 2023-08-14 ENCOUNTER — Telehealth: Payer: Self-pay | Admitting: Emergency Medicine

## 2023-08-14 NOTE — Telephone Encounter (Signed)
Patient needs refill of [facility]

## 2023-08-16 NOTE — Telephone Encounter (Signed)
Patient needs refill of [facility]

## 2023-08-20 ENCOUNTER — Telehealth: Payer: Self-pay

## 2023-08-20 NOTE — Telephone Encounter (Signed)
Copied from CRM [id number]. Topic: General - Other >> Aug 16, 2023 10:27 AM Conni Elliot wrote: Reason for CRM: pt would like to check on safety grab bars and scooter prescription with Dr. Allena Katz

## 2023-08-20 NOTE — Telephone Encounter (Signed)
Grab bars and scooter order have been placed, left vm for patient

## 2023-08-21 ENCOUNTER — Other Ambulatory Visit: Payer: Self-pay | Admitting: Internal Medicine

## 2023-08-21 DIAGNOSIS — F419 Anxiety disorder, unspecified: Secondary | ICD-10-CM

## 2023-08-21 NOTE — Telephone Encounter (Signed)
[facility]  LVM to advise patient of above, and to let him know [facility] if he wants to pick up there

## 2023-08-22 DIAGNOSIS — H40013 Open angle with borderline findings, low risk, bilateral: Secondary | ICD-10-CM | POA: Diagnosis not present

## 2023-08-22 DIAGNOSIS — H2513 Age-related nuclear cataract, bilateral: Secondary | ICD-10-CM | POA: Diagnosis not present

## 2023-08-27 ENCOUNTER — Telehealth: Payer: Self-pay | Admitting: Internal Medicine

## 2023-08-27 NOTE — Telephone Encounter (Signed)
Copied from CRM [id number]. Topic: General - Other >> Aug 27, 2023 10:25 AM Chantha C wrote:  Reason for CRM: Pt would like to an update on if the safety grab bars and scooter prescription with Dr. Allena Katz is done, please c/b at [phone]

## 2023-08-28 NOTE — Telephone Encounter (Signed)
Unable to reach patient.

## 2023-08-31 ENCOUNTER — Ambulatory Visit: Payer: Self-pay | Admitting: Licensed Clinical Social Worker

## 2023-08-31 NOTE — Patient Outreach (Signed)
  Care Coordination   Follow Up Visit Note   08/31/2023 Name: JAHSAI PENNACHIO MRN: [id number] DOB: Dec 18, 1955  NOCHOLAS OUELLETTE is a 67 y.o. year old male who sees Anabel Halon, MD for primary care. I spoke with  [name] by phone today.  What matters to the patients health and wellness today?  Patient now has a scooter, raised toilet seat and new oxygen, and in need of a ramp and a grab bar.     Goals Addressed             This Visit's Progress    Care Coordination Activities       Care Coordination Interventions: Patient stated that the PCP wrote the order for his oxygen and that he was able to get his new oxygen form [facility]. Patient stated that he also got a new scooter and his raised toilet seat. Still in need of a grab bar. Patient stated that he is now in need of a ramp at his house and the SW will mail out some resources on [employer]. SW will follow up on 09/17/2023 at 1:00 pm.        SDOH assessments and interventions completed:  Yes  SDOH Interventions Today    Flowsheet Row Most Recent Value  SDOH Interventions   Food Insecurity Interventions Intervention Not Indicated  Housing Interventions Intervention Not Indicated  Transportation Interventions Intervention Not Indicated  Utilities Interventions Intervention Not Indicated        Care Coordination Interventions:  Yes, provided  Interventions Today    Flowsheet Row Most Recent Value  General Interventions   General Interventions Discussed/Reviewed General Interventions Reviewed  [Patient needs resources for a ramp to be built, SW will mail [employer] resources]        Follow up plan: Follow up call scheduled for 09/17/2023 1:00 pm    Encounter Outcome:  Patient Visit Completed   Jeanie Cooks, PhD [facility], [facility] Social Worker Direct Dial: [phone]  Fax: [phone]

## 2023-08-31 NOTE — Patient Instructions (Signed)
Visit Information  Thank you for taking time to visit with me today. Please don't hesitate to contact me if I can be of assistance to you.   Following are the goals we discussed today:   Goals Addressed             This Visit's Progress    Care Coordination Activities       Care Coordination Interventions: Patient stated that the PCP wrote the order for his oxygen and that he was able to get his new oxygen form [facility]. Patient stated that he also got a new scooter and his raised toilet seat. Still in need of a grab bar. Patient stated that he is now in need of a ramp at his house and the SW will mail out some resources on [employer]. SW will follow up on 09/17/2023 at 1:00 pm.        Our next appointment is by telephone on 09/17/2023 at 1:00 pm  Please call the care guide team at [phone] if you need to cancel or reschedule your appointment.   If you are experiencing a Mental Health or Behavioral Health Crisis or need someone to talk to, please call the Suicide and Crisis Lifeline: 988 go to [facility] [address] [place] [phone]) call 911  Patient verbalizes understanding of instructions and care plan provided today and agrees to view in MyChart. Active MyChart status and patient understanding of how to access instructions and care plan via MyChart confirmed with patient.     Jeanie Cooks, PhD [facility], [facility] Social Worker Direct Dial: [phone]  Fax: [phone]

## 2023-09-11 NOTE — Telephone Encounter (Signed)
Please try PT one more time so I can close encounter. Thanks.

## 2023-09-14 DIAGNOSIS — Z1211 Encounter for screening for malignant neoplasm of colon: Secondary | ICD-10-CM | POA: Diagnosis not present

## 2023-09-14 DIAGNOSIS — Z1212 Encounter for screening for malignant neoplasm of rectum: Secondary | ICD-10-CM | POA: Diagnosis not present

## 2023-09-17 ENCOUNTER — Other Ambulatory Visit: Payer: Self-pay | Admitting: Internal Medicine

## 2023-09-17 ENCOUNTER — Ambulatory Visit: Payer: Self-pay | Admitting: Licensed Clinical Social Worker

## 2023-09-17 DIAGNOSIS — M5137 Other intervertebral disc degeneration, lumbosacral region with discogenic back pain only: Secondary | ICD-10-CM

## 2023-09-17 DIAGNOSIS — R5381 Other malaise: Secondary | ICD-10-CM

## 2023-09-17 DIAGNOSIS — Z7409 Other reduced mobility: Secondary | ICD-10-CM

## 2023-09-17 MED ORDER — SUCTION GRAB BAR MISC: 0 refills | Status: DC

## 2023-09-17 MED ORDER — SUCTION GRAB BAR MISC: 0 refills | Status: AC

## 2023-09-17 NOTE — Patient Instructions (Signed)
Visit Information  Thank you for taking time to visit with me today. Please don't hesitate to contact me if I can be of assistance to you.   Following are the goals we discussed today:   Goals Addressed             This Visit's Progress    COMPLETED: Care Coordination Activities       Care Coordination Interventions: Patient stated that the PCP wrote the order for his oxygen and that he was able to get his new oxygen form [facility]. Patient stated that he also got a new scooter and his raised toilet seat. Still in need of a grab bar. Patient stated that he is now in need of a ramp at his house and the SW will mail out some resources on [employer]. SW will follow up on 09/17/2023 at 1:00 pm.       No further follow per the patient, SW stated that if the patient has any needs for resources to have the PCP make a referral .   Please call the care guide team at [phone] if you need to cancel or reschedule your appointment.   If you are experiencing a Mental Health or Behavioral Health Crisis or need someone to talk to, please call the Suicide and Crisis Lifeline: 988 go to [facility] [address], [place] [phone]) call 911  Patient verbalizes understanding of instructions and care plan provided today and agrees to view in MyChart. Active MyChart status and patient understanding of how to access instructions and care plan via MyChart confirmed with patient.     Jeanie Cooks, PhD [facility], [facility] Social Worker Direct Dial: [phone]  Fax: [phone]

## 2023-09-17 NOTE — Patient Outreach (Signed)
  Care Coordination   Follow Up Visit Note   09/17/2023 Name: Jeffrey Krueger MRN: [id number] DOB: 28-Feb-1956  Jeffrey Krueger is a 67 y.o. year old male who sees Anabel Halon, MD for primary care. I spoke with  [name] by phone today.  What matters to the patients health and wellness today?  Wants the SW to send the PCP for a suction grab bar but stated that the SW does not have to follow up and that he can handle it form here.     Goals Addressed             This Visit's Progress    COMPLETED: Care Coordination Activities       Care Coordination Interventions: Patient stated that the PCP wrote the order for his oxygen and that he was able to get his new oxygen form [facility]. Patient stated that he also got a new scooter and his raised toilet seat. Still in need of a grab bar. Patient stated that he is now in need of a ramp at his house and the SW will mail out some resources on [employer]. SW will follow up on 09/17/2023 at 1:00 pm.        SDOH assessments and interventions completed:  Yes  SDOH Interventions Today    Flowsheet Row Most Recent Value  SDOH Interventions   Food Insecurity Interventions Intervention Not Indicated  Housing Interventions Intervention Not Indicated  Transportation Interventions Intervention Not Indicated  Utilities Interventions Intervention Not Indicated        Care Coordination Interventions:  Yes, provided  Interventions Today    Flowsheet Row Most Recent Value  General Interventions   General Interventions Discussed/Reviewed General Interventions Reviewed  [Wants the SW to send the PCP for a suction grab bar but stated that the SW does not have to follow up and that he can handle it form here.]        Follow up plan: No further intervention required.   Encounter Outcome:  Patient Visit Completed   Jeanie Cooks, PhD [facility], [facility] Social Worker Direct Dial:  [phone]  Fax: [phone]

## 2023-09-22 LAB — COLOGUARD: COLOGUARD: POSITIVE — AB

## 2023-09-24 ENCOUNTER — Other Ambulatory Visit: Payer: Self-pay | Admitting: Internal Medicine

## 2023-09-24 DIAGNOSIS — R195 Other fecal abnormalities: Secondary | ICD-10-CM | POA: Insufficient documentation

## 2023-09-26 DIAGNOSIS — J449 Chronic obstructive pulmonary disease, unspecified: Secondary | ICD-10-CM | POA: Diagnosis not present

## 2023-10-01 DIAGNOSIS — H401131 Primary open-angle glaucoma, bilateral, mild stage: Secondary | ICD-10-CM | POA: Diagnosis not present

## 2023-10-01 DIAGNOSIS — H25813 Combined forms of age-related cataract, bilateral: Secondary | ICD-10-CM | POA: Diagnosis not present

## 2023-10-01 DIAGNOSIS — H40053 Ocular hypertension, bilateral: Secondary | ICD-10-CM | POA: Diagnosis not present

## 2023-10-01 DIAGNOSIS — H02831 Dermatochalasis of right upper eyelid: Secondary | ICD-10-CM | POA: Diagnosis not present

## 2023-10-02 ENCOUNTER — Encounter (INDEPENDENT_AMBULATORY_CARE_PROVIDER_SITE_OTHER): Payer: Self-pay | Admitting: *Deleted

## 2023-10-19 ENCOUNTER — Other Ambulatory Visit: Payer: Self-pay | Admitting: Internal Medicine

## 2023-10-19 DIAGNOSIS — F3341 Major depressive disorder, recurrent, in partial remission: Secondary | ICD-10-CM

## 2023-10-19 NOTE — Telephone Encounter (Signed)
Copied from CRM [id number]. Topic: Clinical - Medication Refill >> Oct 19, 2023  1:45 PM Joanette Gula wrote: Most Recent Primary Care Visit:  Provider: Anabel Halon  Department: [facility]  Visit Type: OFFICE VISIT  Date: 07/19/2023  Medication: traZODone (DESYREL) 100 MG tablet  FORGOT TO ADD TO MY OTHER CRM  Has the patient contacted their pharmacy? Yes (Agent: If no, request that the patient contact the pharmacy for the refill. If patient does not wish to contact the pharmacy document the reason why and proceed with request.) (Agent: If yes, when and what did the pharmacy advise?)  Is this the correct pharmacy for this prescription? Yes If no, delete pharmacy and type the correct one.  This is the patient's preferred pharmacy:  [facility] - [place], [place] - [address] [place] [address] Phone: [phone] Fax: [phone]   Has the prescription been filled recently? Yes  Is the patient out of the medication? Yes  Has the patient been seen for an appointment in the last year OR does the patient have an upcoming appointment? Yes  Can we respond through MyChart? Yes  Agent: Please be advised that Rx refills may take up to 3 business days. We ask that you follow-up with your pharmacy.

## 2023-10-22 MED ORDER — TRAZODONE HCL 100 MG PO TABS: 100.0000 mg | ORAL_TABLET | Freq: Every day | ORAL | 1 refills | Status: DC

## 2023-10-23 ENCOUNTER — Other Ambulatory Visit: Payer: Self-pay | Admitting: Internal Medicine

## 2023-10-23 DIAGNOSIS — J019 Acute sinusitis, unspecified: Secondary | ICD-10-CM

## 2023-10-25 ENCOUNTER — Encounter (INDEPENDENT_AMBULATORY_CARE_PROVIDER_SITE_OTHER): Payer: Self-pay

## 2023-10-25 ENCOUNTER — Encounter (INDEPENDENT_AMBULATORY_CARE_PROVIDER_SITE_OTHER): Payer: Self-pay | Admitting: Gastroenterology

## 2023-10-25 ENCOUNTER — Telehealth: Payer: Self-pay | Admitting: Internal Medicine

## 2023-10-25 ENCOUNTER — Other Ambulatory Visit: Payer: Self-pay

## 2023-10-25 ENCOUNTER — Ambulatory Visit (INDEPENDENT_AMBULATORY_CARE_PROVIDER_SITE_OTHER): Payer: Medicare PPO | Admitting: Gastroenterology

## 2023-10-25 VITALS — BP 146/80 | HR 85 | Temp 97.8°F | Ht 69.0 in | Wt 128.4 lb

## 2023-10-25 DIAGNOSIS — J449 Chronic obstructive pulmonary disease, unspecified: Secondary | ICD-10-CM

## 2023-10-25 DIAGNOSIS — R195 Other fecal abnormalities: Secondary | ICD-10-CM

## 2023-10-25 MED ORDER — BREZTRI AEROSPHERE 160-9-4.8 MCG/ACT IN AERO: 2.0000 | INHALATION_SPRAY | Freq: Two times a day (BID) | RESPIRATORY_TRACT | 11 refills | Status: DC

## 2023-10-25 NOTE — Telephone Encounter (Signed)
Prescription Request  10/25/2023  LOV: 07/19/2023  What is the name of the medication or equipment? BREZTRI AEROSPHERE 160-9-4.8 MCG/ACT AERO [[id number]]   predniSONE (DELTASONE) 20 MG tablet [[id number]]    Have you contacted your pharmacy to request a refill? Yes   Which pharmacy would you like this sent to?   [id number] [place], [place] - [address] [place] [address] Phone: [phone] Fax: [phone]    Patient notified that their request is being sent to the clinical staff for review and that they should receive a response within 2 business days.   Please advise at [facility] [phone]

## 2023-10-25 NOTE — Telephone Encounter (Signed)
Refilled bretzri inhaler, please advice on the prednisone rx?

## 2023-10-25 NOTE — Progress Notes (Addendum)
I stepped into the room at 1:45 PM to evaluate the patient, family member reported that "we ran out of oxygen from his tank".  Patient will be taken by family member back home, where he has oxygen supply (we do not have supplemental oxygen available at our clinic).The patient was taken to the primary care office downstairs where there was an oxygen supply.  Clinic appointment will be rescheduled.  Clinically, the patient appeared cachectic. He is oxygen dependent.  Will need to address the candidacy for undergoing colonoscopy under propofol sedation and the potential benefits/risks of proceeding with this from the cardiopulmonary perspective, as well as the life expectancy of the patient.

## 2023-10-26 ENCOUNTER — Telehealth: Payer: Self-pay | Admitting: Internal Medicine

## 2023-10-26 ENCOUNTER — Other Ambulatory Visit: Payer: Self-pay

## 2023-10-26 DIAGNOSIS — J449 Chronic obstructive pulmonary disease, unspecified: Secondary | ICD-10-CM

## 2023-10-26 MED ORDER — BREZTRI AEROSPHERE 160-9-4.8 MCG/ACT IN AERO: 2.0000 | INHALATION_SPRAY | Freq: Two times a day (BID) | RESPIRATORY_TRACT | 11 refills | Status: DC

## 2023-10-26 NOTE — Telephone Encounter (Signed)
 Pt informed

## 2023-10-26 NOTE — Telephone Encounter (Signed)
Refill sent to [facility]

## 2023-10-26 NOTE — Telephone Encounter (Signed)
Copied from CRM [id number]. Topic: Clinical - Prescription Issue >> Oct 26, 2023  2:22 PM Ivette P wrote: Reason for CRM: Pt called in because refill for prescription BREZTRI AEROSPHERE 160-9-4.8 MCG/ACT AERO was sent to wrong pharmacy, please send to   [facility]  [phone] [address] [place], [place] [address]   [facility] not [facility]

## 2023-10-27 DIAGNOSIS — J449 Chronic obstructive pulmonary disease, unspecified: Secondary | ICD-10-CM | POA: Diagnosis not present

## 2023-10-29 DIAGNOSIS — H25811 Combined forms of age-related cataract, right eye: Secondary | ICD-10-CM | POA: Diagnosis not present

## 2023-10-31 ENCOUNTER — Encounter (HOSPITAL_COMMUNITY): Payer: Self-pay

## 2023-10-31 NOTE — H&P (Signed)
 Surgical History & Physical  Patient Name: Jeffrey Krueger  DOB: 05/24/1956  Surgery: Cataract extraction with intraocular lens implant phacoemulsification; Right Eye Surgeon: Lynwood Hermann MD Surgery Date: 11/05/2023 Pre-Op Date: 10/01/2023  HPI: A 29 Yr. old male patient 1.  The patient is present today for a cataract evaluation. The patient complains of difficulty when viewing TV, reading closed caption, news scrolls on TV, which began many years ago. Both eyes are affected. The episode is constant. The patient describes hazy symptoms affecting their eyes/vision. Distance vision is worse than near. This is negatively affecting the patient's quality of life and the patient is unable to function adequately in life with the current level of vision.  Medical History: Dry Eyes Cataracts  Review of Systems  Anxiety disorder, Negative Allergic/Immunologic Negative Cardiovascular Negative Constitutional Negative Endocrine Negative Eyes Negative Gastrointestinal Negative Genitourinary Negative Hemotologic/Lymphatic Negative Integumentary Negative Musculoskeletal Negative Neurological Negative Psychiatry Negative Respiratory  Social Current every day smoker of Cigarettes   Medication Prednisolone acetate, Ilevro, Moxifloxacin ,  omeprazole  ,  albuterol  sulfate ,  albuterol  sulfate ,  trazodone  ,  trazodone  ,  bupropion  HCl ,  Breztri  Aerosphere   Sx/Procedures None  Drug Allergies  NKDA  History & Physical: Heent: cataracts NECK: supple without bruits LUNGS: lungs clear to auscultation CV: regular rate and rhythm Abdomen: soft and non-tender  Impression & Plan: Assessment: 1.  COMBINED FORMS AGE RELATED CATARACT; Both Eyes (H25.813) 2.  Ocular Hypertension; Both Eyes (H40.053) 3.  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  DERMATOCHALASIS, no surgery; Right Upper Lid, Left Upper Lid (H02.831, H02.834) 5.  BLEPHARITIS; Right Upper Lid, Right Lower Lid, Left  Upper Lid, Left Lower Lid (H01.001, H01.002,H01.004,H01.005) 6.  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  Cataract accounts for the patient's decreased vision. This visual impairment is not correctable with a tolerable change in glasses or contact lenses. Cataract surgery with an implantation of a new lens should significantly improve the visual and functional status of the patient. Discussed all risks, benefits, alternatives, and potential complications. Discussed the procedures and recovery. Patient desires to have surgery. A-scan ordered and performed today for intra-ocular lens calculations. The surgery will be performed in order to improve vision for driving, reading, and for eye examinations. Recommend phacoemulsification with intra-ocular lens. Recommend Dextenza  for post-operative pain and inflammation. Right Eye. Dilates poorly - shugarcaine by protocol. Malyugin Ring. Omidira. Recommend iAccess goniotomy both eyes at the time of cataract surgery to improve compliance and help lower eye pressure.  2.  IOPs above normal in both eyes today. Recommend iAccess goniotomy both eyes at the time of cataract surgery to improve compliance and help lower eye pressure.  3.  Based on cup to disc ratio. No family history, normal thickness of corneas. Findings, prognosis and treatment options reviewed. RNFL shows: thinning OU 10/01/23 IOP above normal. Detailed discussion about glaucoma today including importance of maintaining good follow up and following treatment plan, and the possibility of irreversible blindness as part of this disease process.  4.  Asymptomatic, recommend observation for now. Findings, prognosis and treatment options reviewed.  5.  Blepharitis is present - recommend regular lid cleaning.  6.  Observe; Artificial tears as needed for irritation.

## 2023-11-01 ENCOUNTER — Encounter (HOSPITAL_COMMUNITY)
Admission: RE | Admit: 2023-11-01 | Discharge: 2023-11-01 | Disposition: A | Discharge status: Home / Self Care | Payer: Medicare PPO | Source: Ambulatory Visit | Attending: Ophthalmology | Admitting: Ophthalmology

## 2023-11-05 ENCOUNTER — Encounter (HOSPITAL_COMMUNITY): Payer: Self-pay | Admitting: Ophthalmology

## 2023-11-05 ENCOUNTER — Other Ambulatory Visit: Payer: Self-pay

## 2023-11-05 ENCOUNTER — Ambulatory Visit (HOSPITAL_COMMUNITY): Payer: Medicare PPO | Admitting: Anesthesiology

## 2023-11-05 ENCOUNTER — Ambulatory Visit (HOSPITAL_COMMUNITY)
Admission: RE | Admit: 2023-11-05 | Discharge: 2023-11-05 | Disposition: A | Discharge status: Home / Self Care | Payer: Medicare PPO | Attending: Ophthalmology | Admitting: Ophthalmology

## 2023-11-05 ENCOUNTER — Encounter (HOSPITAL_COMMUNITY)
Admission: RE | Disposition: A | Discharge status: Home / Self Care | Payer: Self-pay | Source: Home / Self Care | Attending: Ophthalmology

## 2023-11-05 DIAGNOSIS — J449 Chronic obstructive pulmonary disease, unspecified: Secondary | ICD-10-CM

## 2023-11-05 DIAGNOSIS — M1990 Unspecified osteoarthritis, unspecified site: Secondary | ICD-10-CM | POA: Insufficient documentation

## 2023-11-05 DIAGNOSIS — H11151 Pinguecula, right eye: Secondary | ICD-10-CM | POA: Insufficient documentation

## 2023-11-05 DIAGNOSIS — F418 Other specified anxiety disorders: Secondary | ICD-10-CM

## 2023-11-05 DIAGNOSIS — H40051 Ocular hypertension, right eye: Secondary | ICD-10-CM

## 2023-11-05 DIAGNOSIS — H401131 Primary open-angle glaucoma, bilateral, mild stage: Secondary | ICD-10-CM | POA: Diagnosis not present

## 2023-11-05 DIAGNOSIS — H2181 Floppy iris syndrome: Secondary | ICD-10-CM | POA: Diagnosis not present

## 2023-11-05 DIAGNOSIS — R0602 Shortness of breath: Secondary | ICD-10-CM | POA: Insufficient documentation

## 2023-11-05 DIAGNOSIS — H25813 Combined forms of age-related cataract, bilateral: Secondary | ICD-10-CM | POA: Diagnosis not present

## 2023-11-05 DIAGNOSIS — H0100B Unspecified blepharitis left eye, upper and lower eyelids: Secondary | ICD-10-CM | POA: Insufficient documentation

## 2023-11-05 DIAGNOSIS — Z9981 Dependence on supplemental oxygen: Secondary | ICD-10-CM | POA: Diagnosis not present

## 2023-11-05 DIAGNOSIS — H0100A Unspecified blepharitis right eye, upper and lower eyelids: Secondary | ICD-10-CM | POA: Diagnosis not present

## 2023-11-05 DIAGNOSIS — H02831 Dermatochalasis of right upper eyelid: Secondary | ICD-10-CM | POA: Insufficient documentation

## 2023-11-05 DIAGNOSIS — R519 Headache, unspecified: Secondary | ICD-10-CM | POA: Insufficient documentation

## 2023-11-05 DIAGNOSIS — F32A Depression, unspecified: Secondary | ICD-10-CM | POA: Insufficient documentation

## 2023-11-05 DIAGNOSIS — H02834 Dermatochalasis of left upper eyelid: Secondary | ICD-10-CM | POA: Diagnosis not present

## 2023-11-05 DIAGNOSIS — H40053 Ocular hypertension, bilateral: Secondary | ICD-10-CM | POA: Insufficient documentation

## 2023-11-05 DIAGNOSIS — F419 Anxiety disorder, unspecified: Secondary | ICD-10-CM | POA: Insufficient documentation

## 2023-11-05 DIAGNOSIS — H25811 Combined forms of age-related cataract, right eye: Secondary | ICD-10-CM

## 2023-11-05 DIAGNOSIS — F17200 Nicotine dependence, unspecified, uncomplicated: Secondary | ICD-10-CM | POA: Diagnosis not present

## 2023-11-05 DIAGNOSIS — H401111 Primary open-angle glaucoma, right eye, mild stage: Secondary | ICD-10-CM | POA: Diagnosis not present

## 2023-11-05 HISTORY — PX: CATARACT EXTRACTION EXTRACAPSULAR: SHX1305

## 2023-11-05 SURGERY — EXTRACTION, CATARACT, WITH IOL INSERTION
Anesthesia: Monitor Anesthesia Care | Site: Eye | Laterality: Right

## 2023-11-05 MED ORDER — STERILE WATER FOR IRRIGATION IR SOLN
Status: DC | PRN
  Administered 2023-11-05: 25 mL

## 2023-11-05 MED ORDER — PHENYLEPHRINE-KETOROLAC 1-0.3 % IO SOLN
INTRAOCULAR | Status: DC | PRN
  Administered 2023-11-05: 500 mL via OPHTHALMIC

## 2023-11-05 MED ORDER — PHENYLEPHRINE HCL 2.5 % OP SOLN
1.0000 [drp] | OPHTHALMIC | Status: AC | PRN
Start: 2023-11-05 — End: 2023-11-05
  Administered 2023-11-05 (×3): 1 [drp] via OPHTHALMIC

## 2023-11-05 MED ORDER — LIDOCAINE HCL (PF) 1 % IJ SOLN
INTRAOCULAR | Status: DC | PRN
  Administered 2023-11-05: 1 mL via OPHTHALMIC

## 2023-11-05 MED ORDER — BSS IO SOLN
INTRAOCULAR | Status: DC | PRN
  Administered 2023-11-05: 15 mL via INTRAOCULAR

## 2023-11-05 MED ORDER — TOBRAMYCIN-DEXAMETHASONE 0.3-0.1 % OP SUSP
OPHTHALMIC | Status: DC | PRN
  Administered 2023-11-05: 2 [drp] via OPHTHALMIC

## 2023-11-05 MED ORDER — IPRATROPIUM-ALBUTEROL 0.5-2.5 (3) MG/3ML IN SOLN
RESPIRATORY_TRACT | Status: AC
  Filled 2023-11-05: qty 3

## 2023-11-05 MED ORDER — SODIUM HYALURONATE 10 MG/ML IO SOLUTION
PREFILLED_SYRINGE | INTRAOCULAR | Status: DC | PRN
  Administered 2023-11-05: .85 mL via INTRAOCULAR

## 2023-11-05 MED ORDER — SODIUM HYALURONATE 23MG/ML IO SOSY
PREFILLED_SYRINGE | INTRAOCULAR | Status: DC | PRN
  Administered 2023-11-05: .6 mL via INTRAOCULAR

## 2023-11-05 MED ORDER — TETRACAINE HCL 0.5 % OP SOLN
1.0000 [drp] | OPHTHALMIC | Status: AC | PRN
Start: 2023-11-05 — End: 2023-11-05
  Administered 2023-11-05 (×3): 1 [drp] via OPHTHALMIC

## 2023-11-05 MED ORDER — SODIUM CHLORIDE 0.9% FLUSH
3.0000 mL | Freq: Two times a day (BID) | INTRAVENOUS | Status: DC
  Administered 2023-11-05: 10 mL via INTRAVENOUS

## 2023-11-05 MED ORDER — SODIUM CHLORIDE 0.9% FLUSH: 3.0000 mL | INTRAVENOUS | Status: DC | PRN

## 2023-11-05 MED ORDER — IPRATROPIUM-ALBUTEROL 0.5-2.5 (3) MG/3ML IN SOLN
3.0000 mL | Freq: Once | RESPIRATORY_TRACT | Status: AC
  Administered 2023-11-05: 3 mL via RESPIRATORY_TRACT

## 2023-11-05 MED ORDER — SODIUM CHLORIDE 0.9% FLUSH
INTRAVENOUS | Status: DC | PRN
Start: 2023-11-05 — End: 2023-11-05
  Administered 2023-11-05: 5 mL via INTRAVENOUS

## 2023-11-05 MED ORDER — LIDOCAINE HCL 3.5 % OP GEL
1.0000 | Freq: Once | OPHTHALMIC | Status: AC
  Administered 2023-11-05: 1 via OPHTHALMIC

## 2023-11-05 MED ORDER — MIDAZOLAM HCL 2 MG/2ML IJ SOLN
INTRAMUSCULAR | Status: AC
  Filled 2023-11-05: qty 2

## 2023-11-05 MED ORDER — TROPICAMIDE 1 % OP SOLN
1.0000 [drp] | OPHTHALMIC | Status: AC | PRN
  Administered 2023-11-05 (×3): 1 [drp] via OPHTHALMIC

## 2023-11-05 MED ORDER — MIDAZOLAM HCL 2 MG/2ML IJ SOLN
INTRAMUSCULAR | Status: DC | PRN
  Administered 2023-11-05: 1 mg via INTRAVENOUS

## 2023-11-05 MED ORDER — POVIDONE-IODINE 5 % OP SOLN
OPHTHALMIC | Status: DC | PRN
  Administered 2023-11-05: 1 via OPHTHALMIC

## 2023-11-05 SURGICAL SUPPLY — 16 items
CATARACT SUITE SIGHTPATH (MISCELLANEOUS) ×2
CLIP IPRISM (KITS) ×1 IMPLANT
CLOTH BEACON ORANGE TIMEOUT ST (SAFETY) ×3 IMPLANT
EYE SHIELD UNIVERSAL CLEAR (GAUZE/BANDAGES/DRESSINGS) ×1 IMPLANT
FEE CATARACT SUITE SIGHTPATH (MISCELLANEOUS) ×2 IMPLANT
GLOVE BIOGEL PI IND STRL 6.5 (GLOVE) ×1 IMPLANT
GLOVE BIOGEL PI IND STRL 7.0 (GLOVE) ×5 IMPLANT
LENS IOL TECNIS EYHANCE 20.5 (Intraocular Lens) ×1 IMPLANT
NDL HYPO 18GX1.5 BLUNT FILL (NEEDLE) ×4 IMPLANT
NEEDLE HYPO 18GX1.5 BLUNT FILL (NEEDLE) ×2
PAD ARMBOARD 7.5X6 YLW CONV (MISCELLANEOUS) ×3 IMPLANT
RING MALYGIN 7.0 (MISCELLANEOUS) ×1 IMPLANT
SYR TB 1ML LL NO SAFETY (SYRINGE) ×3 IMPLANT
TAPE SURG TRANSPORE 1 IN (GAUZE/BANDAGES/DRESSINGS) ×1 IMPLANT
TREPHINE GLAUKO IACCESS TRBCLR (OPHTHALMIC) ×1 IMPLANT
WATER STERILE IRR 250ML POUR (IV SOLUTION) ×3 IMPLANT

## 2023-11-05 NOTE — Transfer of Care (Signed)
 Immediate Anesthesia Transfer of Care Note  Patient: Jeffrey Krueger  Procedure(s) Performed: CATARACT EXTRACTION EXTRACAPSULAR WITH INTRAOCULAR LENS PLACEMENT (IOC) (Right: Eye) Goniotomy (Right: Eye)  Patient Location: Short Stay  Anesthesia Type:General  Level of Consciousness: awake  Airway & Oxygen  Therapy: Patient Spontanous Breathing  Post-op Assessment: Report given to RN  Post vital signs: Reviewed  Last Vitals:  Vitals Value Taken Time  BP    Temp 36.9 C 11/05/23 1453  Pulse 87 11/05/23 1453  Resp    SpO2      Last Pain:  Vitals:   11/05/23 1453  TempSrc: Oral  PainSc:          Complications: No notable events documented.

## 2023-11-05 NOTE — Progress Notes (Signed)
 Went to lobby to receive pt for his procedure. Pt in w/c with Mt Dew soda can in hand. Half of soda in can gone. Pt stated that he had drank soda 1 hour ago. Upon continued questioning, info was given that pt had been sipping on soda entire time he had been waiting in lobby. Dr Adan Holms and Dr Adora Hoover notified. Will do procedure last case today. Pt transferred to postop rm #6 via [facility]. Reconnected to nasal cannula at 3 l/m. Voices no further c/o at this time. Pt update given to son. Voiced understanding.

## 2023-11-05 NOTE — Interval H&P Note (Signed)
 History and Physical Interval Note:  11/05/2023 1:58 PM  Jeffrey Krueger  has presented today for surgery, with the diagnosis of combined forms age related cataract, right eye ocular hypertension, right eye.  The various methods of treatment have been discussed with the patient and family. After consideration of risks, benefits and other options for treatment, the patient has consented to  Procedure(s) with comments: CATARACT EXTRACTION EXTRACAPSULAR WITH INTRAOCULAR LENS PLACEMENT (IOC) (Right) - CDE: Goniotomy (Right) as a surgical intervention.  The patient's history has been reviewed, patient examined, no change in status, stable for surgery.  I have reviewed the patient's chart and labs.  Questions were answered to the patient's satisfaction.     [name]

## 2023-11-05 NOTE — Discharge Instructions (Addendum)
 Please discharge patient when stable, will follow up today with Dr. June Leap at the [facility] office immediately following discharge.  Leave shield in place until visit.  All paperwork with discharge instructions will be given at the office.  [facility] Address:  [address]  [place], [place] [address]

## 2023-11-05 NOTE — Op Note (Signed)
 Date of procedure: 11/05/23  Pre-operative diagnosis:  1) Visually significant age-related combined cataract, Right Eye (H25.811) 2) Primary Open Angle Glaucoma, mild Stage, Right Eye 3) Poor dilation, left eye  Post-operative diagnosis: 1) Visually significant age-related cataract, Right Eye 2) Primary Open Angle Glaucoma, mild Stage, Right Eye 3) Intra-operative floppy iris syndrome, left eye  Procedure:  1) Complex Removal of cataract via phacoemulsification and insertion of intra-ocular lens [employer] DIB00 +20.5D into the capsular bag of the Right Eye 2) Goniotomy treatment of nasal trabecular meshwork with iAccess, Right Eye  Attending surgeon: Pleas Brill. Bernie Fobes, MD, MA  Anesthesia: MAC, Topical Akten   Complications: None  Estimated Blood Loss: <50mL (minimal)  Specimens: None  Implants: As above  Indications:  Visually significant age-related cataract, Right Eye; Glaucoma, Right Eye  Procedure:  The patient was seen and identified in the pre-operative area. The operative eye was identified and dilated.  The operative eye was marked.  Topical anesthesia was administered to the operative eye.     The patient was then to the operative suite and placed in the supine position.  A timeout was performed confirming the patient, procedure to be performed, and all other relevant information.   The patient's face was prepped and draped in the usual fashion for intra-ocular surgery.  A lid speculum was placed into the operative eye and the surgical microscope moved into place and focused.  A superotemporal paracentesis was created using a 20 gauge paracentesis blade.  Shugarcaine was injected into the anterior chamber.  Viscoelastic was injected into the anterior chamber.  A temporal clear-corneal main wound incision was created using a 2.51mm microkeratome. A 7.74mm Malyugin ring was placed. A continuous curvilinear capsulorrhexis was initiated using an irrigating cystitome and  completed using capsulorrhexis forceps.  Hydrodissection and hydrodeliniation were performed.  Viscoelastic was injected into the anterior chamber.  A phacoemulsification handpiece and a chopper as a second instrument were used to remove the nucleus and epinucleus. The irrigation/aspiration handpiece was used to remove any remaining cortical material.   The capsular bag was reinflated with viscoelastic, checked, and found to be intact.  The intraocular lens was inserted into the capsular bag and dialed into place using a Kuglen hook.    The patient's head was repositioned.  The iAccess was used to extensively treat the nasal trabecular meshwork for 90 degrees for a total of 5 goniotomies under gonioscopy.  The patient's head was returned to neutral. The Malyugin ring was removed. The irrigation/aspiration handpiece was used to remove any remaining viscoelastic.  The clear corneal wound and paracentesis wounds were then hydrated and checked with Weck-Cels to be watertight. TobraDex  drops were placed on the eye. The lid-speculum was removed.  The drape was removed.  The patient's face was cleaned with a wet and dry 4x4. A clear shield was taped over the eye. The patient was taken to the post-operative care unit in good condition, having tolerated the procedure well.  Post-Op Instructions: The patient will follow up at [facility] for a same day post-operative evaluation and will receive all other orders and instructions.

## 2023-11-05 NOTE — Anesthesia Postprocedure Evaluation (Signed)
 Anesthesia Post Note  Patient: Jeffrey Krueger  Procedure(s) Performed: CATARACT EXTRACTION EXTRACAPSULAR WITH INTRAOCULAR LENS PLACEMENT (IOC) (Right: Eye) Goniotomy (Right: Eye)  Patient location during evaluation: Short Stay Anesthesia Type: MAC Level of consciousness: awake and alert Pain management: pain level controlled Vital Signs Assessment: post-procedure vital signs reviewed and stable Respiratory status: spontaneous breathing Cardiovascular status: blood pressure returned to baseline and stable Postop Assessment: no apparent nausea or vomiting Anesthetic complications: no   No notable events documented.   Last Vitals:  Vitals:   11/05/23 1308 11/05/23 1453  BP: (!) 138/91 (!) 122/101  Pulse: 75 87  Resp: 18 (!) 21  Temp: 36.9 C 36.9 C  SpO2: 100% 100%    Last Pain:  Vitals:   11/05/23 1453  TempSrc: Oral  PainSc:                  Willma Obando

## 2023-11-05 NOTE — Anesthesia Preprocedure Evaluation (Addendum)
 Anesthesia Evaluation  Patient identified by MRN, date of birth, ID band Patient awake    Reviewed: Allergy & Precautions, H&P , NPO status , Patient's Chart, lab work & pertinent test results  Airway Mallampati: II  TM Distance: >3 FB Neck ROM: Full    Dental  (+) Edentulous Upper, Edentulous Lower   Pulmonary shortness of breath, with exertion, at rest, lying and Long-Term Oxygen Therapy, COPD, Current Smoker and Patient abstained from smoking.  Decreased BS bilaterally   + wheezing      Cardiovascular negative cardio ROS Normal cardiovascular exam Rhythm:Regular Rate:Normal     Neuro/Psych  Headaches PSYCHIATRIC DISORDERS Anxiety Depression     Neuromuscular disease    GI/Hepatic negative GI ROS, Neg liver ROS,,,  Endo/Other  negative endocrine ROS    Renal/GU negative Renal ROS  negative genitourinary   Musculoskeletal  (+) Arthritis ,    Abdominal   Peds negative pediatric ROS (+)  Hematology negative hematology ROS (+)   Anesthesia Other Findings   Reproductive/Obstetrics negative OB ROS                              Anesthesia Physical Anesthesia Plan  ASA: 4  Anesthesia Plan: MAC   Post-op Pain Management: Minimal or no pain anticipated   Induction: Intravenous  PONV Risk Score and Plan:   Airway Management Planned: Nasal Cannula  Additional Equipment:   Intra-op Plan:   Post-operative Plan:   Informed Consent: I have reviewed the patients History and Physical, chart, labs and discussed the procedure including the risks, benefits and alternatives for the proposed anesthesia with the patient or authorized representative who has indicated his/her understanding and acceptance.       Plan Discussed with: CRNA  Anesthesia Plan Comments:         Anesthesia Quick Evaluation

## 2023-11-06 ENCOUNTER — Encounter (HOSPITAL_COMMUNITY): Payer: Self-pay | Admitting: Ophthalmology

## 2023-11-06 NOTE — Anesthesia Postprocedure Evaluation (Signed)
Anesthesia Post Note  Patient: Jeffrey Krueger  Procedure(s) Performed: CATARACT EXTRACTION EXTRACAPSULAR WITH INTRAOCULAR LENS PLACEMENT (IOC) (Right: Eye) Goniotomy (Right: Eye)  Patient location during evaluation: PACU Anesthesia Type: MAC Level of consciousness: awake and alert Pain management: pain level controlled Vital Signs Assessment: post-procedure vital signs reviewed and stable Respiratory status: spontaneous breathing, nonlabored ventilation and respiratory function stable Cardiovascular status: blood pressure returned to baseline and stable Postop Assessment: no apparent nausea or vomiting Anesthetic complications: no   No notable events documented.   Last Vitals:  Vitals:   11/05/23 1308 11/05/23 1453  BP: (!) 138/91 (!) 122/101  Pulse: 75 87  Resp: 18 (!) 21  Temp: 36.9 C 36.9 C  SpO2: 100% 100%    Last Pain:  Vitals:   11/05/23 1453  TempSrc: Oral  PainSc:                  Roslynn Amble

## 2023-11-07 ENCOUNTER — Telehealth: Payer: Self-pay | Admitting: Internal Medicine

## 2023-11-07 NOTE — Telephone Encounter (Signed)
[employer] form  Noted Copied Sleeved (put in provider box)  Call patient when ready

## 2023-11-07 NOTE — Telephone Encounter (Signed)
Patient called asking to speak directly to dr patel, he does not want to speak to no nurse. He asked if Dr Allena Katz would call him.

## 2023-11-12 NOTE — H&P (Signed)
Surgical History & Physical  Patient Name: Jeffrey Krueger  DOB: [birth year]  Surgery: Cataract extraction with intraocular lens implant phacoemulsification; Left Eye Surgeon: Fabio Pierce MD Surgery Date: 11/19/2023 Pre-Op Date: 10/01/2023  HPI: A 28 Yr. old male patient 1. The patient is present today for a cataract evaluation. The patient complains of difficulty when viewing TV, reading closed caption, news scrolls on TV, which began many years ago. Both eyes are affected. The episode is constant. The patient describes hazy symptoms affecting their eyes/vision. Distance vision is worse than near. This is negatively affecting the patient's quality of life and the patient is unable to function adequately in life with the current level of vision.  Medical History: Dry Eyes Cataracts  Anxiety disorder  Review of Systems Negative Allergic/Immunologic Negative Cardiovascular Negative Constitutional Negative Endocrine Negative Eyes Negative Gastrointestinal Negative Genitourinary Negative Hemotologic/Lymphatic Negative Integumentary Negative Musculoskeletal Negative Neurological Negative Psychiatry Negative Respiratory  Social Current every day smoker of Cigarettes   Medication Prednisolone acetate, Ilevro, Moxifloxacin,  omeprazole ,  albuterol sulfate ,  albuterol sulfate ,  trazodone ,  trazodone ,  bupropion HCl ,  Breztri Aerosphere   Sx/Procedures Phaco c IOL OD with iAccess  Drug Allergies  NKDA  History & Physical: Heent: cataract NECK: supple without bruits LUNGS: lungs clear to auscultation CV: regular rate and rhythm Abdomen: soft and non-tender  Impression & Plan: Assessment: 1.  COMBINED FORMS AGE RELATED CATARACT; Both Eyes (H25.813) 2.  Ocular Hypertension; Both Eyes (H40.053) 3.  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  DERMATOCHALASIS, no surgery; Right Upper Lid, Left Upper Lid (H02.831, H02.834) 5.  BLEPHARITIS; Right Upper Lid,  Right Lower Lid, Left Upper Lid, Left Lower Lid (H01.001, H01.002,H01.004,H01.005) 6.  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  Cataract accounts for the patient's decreased vision. This visual impairment is not correctable with a tolerable change in glasses or contact lenses. Cataract surgery with an implantation of a new lens should significantly improve the visual and functional status of the patient. Discussed all risks, benefits, alternatives, and potential complications. Discussed the procedures and recovery. Patient desires to have surgery. A-scan ordered and performed today for intra-ocular lens calculations. The surgery will be performed in order to improve vision for driving, reading, and for eye examinations. Recommend phacoemulsification with intra-ocular lens. Recommend Dextenza for post-operative pain and inflammation. Right Eye. Dilates poorly - shugarcaine by protocol. Malyugin Ring. Omidira. Recommend iAccess goniotomy both eyes at the time of cataract surgery to improve compliance and help lower eye pressure.  2.  IOPs above normal in both eyes today. Recommend iAccess goniotomy both eyes at the time of cataract surgery to improve compliance and help lower eye pressure.  3.  Based on cup to disc ratio. No family history, normal thickness of corneas. Findings, prognosis and treatment options reviewed. RNFL shows: thinning OU 10/01/23 IOP above normal. Detailed discussion about glaucoma today including importance of maintaining good follow up and following treatment plan, and the possibility of irreversible blindness as part of this disease process.  4.  Asymptomatic, recommend observation for now. Findings, prognosis and treatment options reviewed.  5.  Blepharitis is present - recommend regular lid cleaning.  6.  Observe; Artificial tears as needed for irritation.

## 2023-11-13 NOTE — Patient Instructions (Signed)
Jeffrey Krueger  11/13/2023     @PREFPERIOPPHARMACY @   Your procedure is scheduled on November 19, 2023.  Report to [facility] at 12:00  P.M.  Call this number if you have problems the morning of surgery:  [phone]  If you experience any cold or flu symptoms such as cough, fever, chills, shortness of breath, etc. between now and your scheduled surgery, please notify us at the above number.   Remember:  Do not eat or drink after midnight.  You may drink clear liquids until 10:00 a.m .  Clear liquids allowed         are:                    Water, Juice (No red color; non-citric and without pulp; diabetics please choose diet or no sugar options), Carbonated beverages (diabetics please choose diet or no sugar options), Clear Tea (No creamer, milk, or cream, including half & half and powdered creamer), Black Coffee Only (No creamer, milk or cream, including half & half and powdered creamer), Plain Jell-O Only (No red color; diabetics please choose no sugar options), Clear Sports drink (No red color; diabetics please choose diet or no sugar options), and Plain Popsicles Only (No red color; diabetics please choose no sugar options)    Take these medicines the morning of surgery        with A SIP OF WATER :  buproprion, omeprazole, prednisone     Do not wear jewelry, make-up or nail polish, including gel polish,  artificial nails, or any other type of covering on natural nails (fingers and  toes).  Do not wear lotions, powders, or perfumes, or deodorant.  Do not shave 48 hours prior to surgery.  Men may shave face and neck.  Do not bring valuables to the hospital.  [facility] is not responsible for any belongings or valuables.  Contacts, dentures or bridgework may not be worn into surgery.  Leave your suitcase in the car.  After surgery it may be brought to your room.  For patients admitted to the hospital, discharge time will be determined by your treatment team.  Patients discharged  the day of surgery will not be allowed to drive home.     Special instructions:  Do not smoke or vape for 24 hours prior to the procedure.  Follow all prep instructions provided by the office.  Please read over the following fact sheets that you were given. Anesthesia Post-op Instructions and Care and Recovery After Surgery      Cataract  A cataract is a buildup of protein that causes the lens of the eye to become cloudy. The lens is normally clear. It is the part of the eye that is behind the iris and pupil. The lens focuses light on the retina, which lets you see clearly. When a lens becomes cloudy, your vision may become blurry. The clouding can range from a tiny dot of blurriness to complete cloudiness. As some cataracts develop, they can make it harder for you to see things that are far away. (You become more nearsighted.) Other cataracts increase glare or cause more problems with reading. Cataracts usually get worse over time, and sometimes the pupil can look white. As cataracts get worse, they cloud more of the lens, making it difficult to see. Cataracts can affect one eye or both eyes. What are the causes? This condition is usually caused by age-related eye changes. The lens of the eye is  mostly made up of water and protein. Normally, this protein is arranged in a way that keeps the lens clear. Cataracts develop when protein begins to clump together over time. This buildup of protein clouds the lens and lets less light pass through to the retina, which causes blurry vision. What increases the risk? You are more likely to develop this condition if you: Are 21 years of age or older. Have diabetes. Take certain medicines for long periods of time, such as steroids or hormone replacement therapy. Have had a previous eye surgery. Have a family history of cataracts. Other conditions associated with developing cataracts include: Having high blood pressure. Having had a previous eye  injury. Smoking. Being obese. Drinking alcohol heavily. Having been exposed to large amounts of radiation, lead, or other toxic substances. Frequently being exposed to sun or very strong light without eye protection. What are the signs or symptoms? The main symptom of a cataract is blurry vision. Your vision may change or get worse over time. Other symptoms include: Increased glare. Seeing a bright ring or halo around light. Poor night vision. Double vision or seeing shadows in one eye or both eyes. Having trouble seeing or reading up close. Seeing colors that appear faded. How is this diagnosed? This condition is diagnosed with a medical history and eye exam. You should see an eye specialist (optometrist or ophthalmologist). Your health care provider may enlarge (dilate) your pupils with eye drops to see the back of your eye more clearly and look for signs of cataracts or other eye problems. You may also have tests, including: A visual acuity test. This uses a chart to determine the smallest letters that you can see from a specific distance. A slit-lamp exam. This uses a microscope to examine small sections of your eye for abnormalities. Tonometry. This test measures the pressure of the fluid inside your eye. Glare testing. This test shines a light in your eye while you view letters to see whether the bright light affects your vision. How is this treated? Treatment depends on the stage of your cataract. You may benefit from: A new prescription for eyeglasses or contact lenses. You can also use stronger light, especially for reading. These are mainly for early-stage cataracts. Having cataract surgery if the condition is severely affecting your vision. This is needed for late-stage cataracts. Follow these instructions at home: Lifestyle Use stronger or brighter lighting. Consider using a magnifying glass for reading or other activities. Become familiar with your surroundings. Having  poor vision can put you at greater risk for tripping, falling, or bumping into things. Wear sunglasses and a hat if you are sensitive to bright light or are having problems with glare. Do not use any products that contain nicotine or tobacco. These products include cigarettes, chewing tobacco, and vaping devices, such as e-cigarettes. If you need help quitting, ask your health care provider. General instructions If you are prescribed new eyeglasses or contacts, wear them as told by your health care provider. Take over-the-counter and prescription medicines only as told by your health care provider. Do not change your medicines unless told to by your health care provider. Do not drive or use machinery if your vision is blurry, especially at night. Keep your blood sugar under control if you have diabetes. Keep all follow-up visits. This is important. Contact a health care provider if: Your symptoms get worse. Your vision affects your ability to perform daily activities, especially driving. You have new symptoms. Get help right away if:  You have sudden vision loss. You have redness, swelling, or increasing pain in your eye. You develop a sudden, severe headache and increased sensitivity to light. Summary A cataract is a buildup of protein that causes the lens of your eye to become cloudy. Cataracts are very common, especially as people age. Mild cataracts cause mild visual symptoms, while more severe cataracts can cause a significant decrease in quality of life. Mild cataracts can often be treated with a prescription for new glasses or contact lenses, while surgery is often recommended for more severe cataracts. Contact a health care provider if your symptoms get worse or your vision affects your ability to do daily activities. Get help right away if you have sudden vision loss, redness, swelling, or increasing pain in the eye, or you develop a sudden, severe headache or increased sensitivity to  light. This information is not intended to replace advice given to you by your health care provider. Make sure you discuss any questions you have with your health care provider. Document Revised: 04/13/2021 Document Reviewed: 04/13/2021 Elsevier Patient Education  2024 Elsevier Inc.Monitored Anesthesia Care, Care After The following information offers guidance on how to care for yourself after your procedure. Your health care provider may also give you more specific instructions. If you have problems or questions, contact your health care provider. What can I expect after the procedure? After the procedure, it is common to have: Tiredness. Little or no memory about what happened during or after the procedure. Impaired judgment when it comes to making decisions. Nausea or vomiting. Some trouble with balance. Follow these instructions at home: For the time period you were told by your health care provider:  Rest. Do not participate in activities where you could fall or become injured. Do not drive or use machinery. Do not drink alcohol. Do not take sleeping pills or medicines that cause drowsiness. Do not make important decisions or sign legal documents. Do not take care of children on your own. Medicines Take over-the-counter and prescription medicines only as told by your health care provider. If you were prescribed antibiotics, take them as told by your health care provider. Do not stop using the antibiotic even if you start to feel better. Eating and drinking Follow instructions from your health care provider about what you may eat and drink. Drink enough fluid to keep your urine pale yellow. If you vomit: Drink clear fluids slowly and in small amounts as you are able. Clear fluids include water, ice chips, low-calorie sports drinks, and fruit juice that has water added to it (diluted fruit juice). Eat light and bland foods in small amounts as you are able. These foods include  bananas, applesauce, rice, lean meats, toast, and crackers. General instructions  Have a responsible adult stay with you for the time you are told. It is important to have someone help care for you until you are awake and alert. If you have sleep apnea, surgery and some medicines can increase your risk for breathing problems. Follow instructions from your health care provider about wearing your sleep device: When you are sleeping. This includes during daytime naps. While taking prescription pain medicines, sleeping medicines, or medicines that make you drowsy. Do not use any products that contain nicotine or tobacco. These products include cigarettes, chewing tobacco, and vaping devices, such as e-cigarettes. If you need help quitting, ask your health care provider. Contact a health care provider if: You feel nauseous or vomit every time you eat or drink. You feel  light-headed. You are still sleepy or having trouble with balance after 24 hours. You get a rash. You have a fever. You have redness or swelling around the IV site. Get help right away if: You have trouble breathing. You have new confusion after you get home. These symptoms may be an emergency. Get help right away. Call 911. Do not wait to see if the symptoms will go away. Do not drive yourself to the hospital. This information is not intended to replace advice given to you by your health care provider. Make sure you discuss any questions you have with your health care provider. Document Revised: 02/06/2022 Document Reviewed: 02/06/2022 Elsevier Patient Education  2024 [employer].

## 2023-11-15 ENCOUNTER — Encounter (HOSPITAL_COMMUNITY)
Admission: RE | Admit: 2023-11-15 | Discharge: 2023-11-15 | Disposition: A | Discharge status: Home / Self Care | Payer: Medicare PPO | Source: Ambulatory Visit | Attending: Ophthalmology | Admitting: Ophthalmology

## 2023-11-15 ENCOUNTER — Other Ambulatory Visit: Payer: Self-pay | Admitting: Internal Medicine

## 2023-11-15 DIAGNOSIS — J449 Chronic obstructive pulmonary disease, unspecified: Secondary | ICD-10-CM

## 2023-11-16 ENCOUNTER — Encounter (HOSPITAL_COMMUNITY): Payer: Self-pay

## 2023-11-19 ENCOUNTER — Other Ambulatory Visit: Payer: Self-pay | Admitting: Internal Medicine

## 2023-11-19 DIAGNOSIS — J449 Chronic obstructive pulmonary disease, unspecified: Secondary | ICD-10-CM

## 2023-11-19 MED ORDER — BREZTRI AEROSPHERE 160-9-4.8 MCG/ACT IN AERO: 2.0000 | INHALATION_SPRAY | Freq: Two times a day (BID) | RESPIRATORY_TRACT | 11 refills | Status: DC

## 2023-11-19 NOTE — Telephone Encounter (Signed)
 Copied from CRM [id number]. Topic: Clinical - Medication Refill >> Nov 19, 2023 10:43 AM Thomes Dinning wrote: Most Recent Primary Care Visit:  Provider: Anabel Halon  Department: [facility]  Visit Type: OFFICE VISIT  Date: 07/19/2023  Medication: Budeson-Glycopyrrol-Formoterol (BREZTRI AEROSPHERE) 160-9-4.8 MCG/ACT AERO  Has the patient contacted their pharmacy? Yes (Agent: If no, request that the patient contact the pharmacy for the refill. If patient does not wish to contact the pharmacy document the reason why and proceed with request.) (Agent: If yes, when and what did the pharmacy advise?)  Is this the correct pharmacy for this prescription? Yes If no, delete pharmacy and type the correct one.  This is the patient's preferred pharmacy:  [facility] - [place], [place] - [address] [place] [address] Phone: [phone] Fax: [phone]  [id number] - [place], [place] - [address] [place] [address] Phone: [phone] Fax: [phone]   Has the prescription been filled recently? No  Is the patient out of the medication? Yes  Has the patient been seen for an appointment in the last year OR does the patient have an upcoming appointment? Yes  Can we respond through MyChart? Yes   Agent: Please be advised that Rx refills may take up to 3 business days. We ask that you follow-up with your pharmacy.

## 2023-11-19 NOTE — Telephone Encounter (Signed)
 Last Fill: 10/26/23  Last OV: 07/19/23 Next OV: 01/17/24  Routing to provider for review/authorization.

## 2023-11-24 DIAGNOSIS — J449 Chronic obstructive pulmonary disease, unspecified: Secondary | ICD-10-CM | POA: Diagnosis not present

## 2023-11-26 DIAGNOSIS — H25812 Combined forms of age-related cataract, left eye: Secondary | ICD-10-CM | POA: Diagnosis not present

## 2023-11-27 ENCOUNTER — Encounter (HOSPITAL_COMMUNITY): Payer: Self-pay

## 2023-11-27 ENCOUNTER — Other Ambulatory Visit: Payer: Self-pay

## 2023-11-28 ENCOUNTER — Encounter (HOSPITAL_COMMUNITY)
Admission: RE | Admit: 2023-11-28 | Discharge: 2023-11-28 | Disposition: A | Discharge status: Home / Self Care | Payer: Medicare PPO | Source: Ambulatory Visit | Attending: Ophthalmology | Admitting: Ophthalmology

## 2023-11-30 ENCOUNTER — Encounter (HOSPITAL_COMMUNITY): Payer: Self-pay | Admitting: Ophthalmology

## 2023-11-30 ENCOUNTER — Ambulatory Visit (HOSPITAL_COMMUNITY): Admitting: Anesthesiology

## 2023-11-30 ENCOUNTER — Ambulatory Visit (HOSPITAL_COMMUNITY)
Admission: RE | Admit: 2023-11-30 | Discharge: 2023-11-30 | Disposition: A | Discharge status: Home / Self Care | Payer: Medicare PPO | Attending: Ophthalmology | Admitting: Ophthalmology

## 2023-11-30 ENCOUNTER — Encounter (HOSPITAL_COMMUNITY)
Admission: RE | Disposition: A | Discharge status: Home / Self Care | Payer: Self-pay | Source: Home / Self Care | Attending: Ophthalmology

## 2023-11-30 DIAGNOSIS — H02831 Dermatochalasis of right upper eyelid: Secondary | ICD-10-CM | POA: Diagnosis not present

## 2023-11-30 DIAGNOSIS — H0100B Unspecified blepharitis left eye, upper and lower eyelids: Secondary | ICD-10-CM | POA: Insufficient documentation

## 2023-11-30 DIAGNOSIS — H11151 Pinguecula, right eye: Secondary | ICD-10-CM | POA: Insufficient documentation

## 2023-11-30 DIAGNOSIS — H2181 Floppy iris syndrome: Secondary | ICD-10-CM | POA: Insufficient documentation

## 2023-11-30 DIAGNOSIS — H401131 Primary open-angle glaucoma, bilateral, mild stage: Secondary | ICD-10-CM | POA: Diagnosis not present

## 2023-11-30 DIAGNOSIS — M1990 Unspecified osteoarthritis, unspecified site: Secondary | ICD-10-CM | POA: Diagnosis not present

## 2023-11-30 DIAGNOSIS — H40053 Ocular hypertension, bilateral: Secondary | ICD-10-CM | POA: Insufficient documentation

## 2023-11-30 DIAGNOSIS — H02834 Dermatochalasis of left upper eyelid: Secondary | ICD-10-CM | POA: Insufficient documentation

## 2023-11-30 DIAGNOSIS — H0100A Unspecified blepharitis right eye, upper and lower eyelids: Secondary | ICD-10-CM | POA: Diagnosis not present

## 2023-11-30 DIAGNOSIS — G709 Myoneural disorder, unspecified: Secondary | ICD-10-CM | POA: Diagnosis not present

## 2023-11-30 DIAGNOSIS — F418 Other specified anxiety disorders: Secondary | ICD-10-CM | POA: Insufficient documentation

## 2023-11-30 DIAGNOSIS — J449 Chronic obstructive pulmonary disease, unspecified: Secondary | ICD-10-CM | POA: Insufficient documentation

## 2023-11-30 DIAGNOSIS — H25813 Combined forms of age-related cataract, bilateral: Secondary | ICD-10-CM | POA: Insufficient documentation

## 2023-11-30 DIAGNOSIS — H259 Unspecified age-related cataract: Secondary | ICD-10-CM | POA: Diagnosis not present

## 2023-11-30 DIAGNOSIS — H40052 Ocular hypertension, left eye: Secondary | ICD-10-CM | POA: Diagnosis not present

## 2023-11-30 DIAGNOSIS — H25812 Combined forms of age-related cataract, left eye: Secondary | ICD-10-CM | POA: Diagnosis not present

## 2023-11-30 DIAGNOSIS — F17210 Nicotine dependence, cigarettes, uncomplicated: Secondary | ICD-10-CM | POA: Diagnosis not present

## 2023-11-30 HISTORY — PX: CATARACT EXTRACTION W/PHACO: SHX586

## 2023-11-30 SURGERY — PHACOEMULSIFICATION, CATARACT, WITH IOL INSERTION
Anesthesia: Monitor Anesthesia Care | Site: Eye | Laterality: Left

## 2023-11-30 MED ORDER — TROPICAMIDE 1 % OP SOLN
1.0000 [drp] | OPHTHALMIC | Status: AC | PRN
  Administered 2023-11-30 (×3): 1 [drp] via OPHTHALMIC

## 2023-11-30 MED ORDER — PHENYLEPHRINE HCL 2.5 % OP SOLN
1.0000 [drp] | OPHTHALMIC | Status: AC | PRN
  Administered 2023-11-30 (×3): 1 [drp] via OPHTHALMIC

## 2023-11-30 MED ORDER — MOXIFLOXACIN HCL 5 MG/ML IO SOLN
INTRAOCULAR | Status: DC | PRN
  Administered 2023-11-30: .3 mL via OPHTHALMIC

## 2023-11-30 MED ORDER — BSS IO SOLN
INTRAOCULAR | Status: DC | PRN
  Administered 2023-11-30: 500 mL via OPHTHALMIC

## 2023-11-30 MED ORDER — LIDOCAINE HCL (PF) 1 % IJ SOLN
INTRAOCULAR | Status: DC | PRN
  Administered 2023-11-30: 1 mL via OPHTHALMIC

## 2023-11-30 MED ORDER — SODIUM HYALURONATE 23MG/ML IO SOSY
PREFILLED_SYRINGE | INTRAOCULAR | Status: DC | PRN
  Administered 2023-11-30: .6 mL via INTRAOCULAR

## 2023-11-30 MED ORDER — POVIDONE-IODINE 5 % OP SOLN
OPHTHALMIC | Status: DC | PRN
  Administered 2023-11-30: 1 via OPHTHALMIC

## 2023-11-30 MED ORDER — LACTATED RINGERS IV SOLN: INTRAVENOUS | Status: DC

## 2023-11-30 MED ORDER — SODIUM HYALURONATE 10 MG/ML IO SOLUTION
PREFILLED_SYRINGE | INTRAOCULAR | Status: DC | PRN
  Administered 2023-11-30: .85 mL via INTRAOCULAR

## 2023-11-30 MED ORDER — BSS IO SOLN
INTRAOCULAR | Status: DC | PRN
  Administered 2023-11-30: 15 mL via INTRAOCULAR

## 2023-11-30 MED ORDER — STERILE WATER FOR IRRIGATION IR SOLN
Status: DC | PRN
  Administered 2023-11-30: 250 mL

## 2023-11-30 MED ORDER — TETRACAINE HCL 0.5 % OP SOLN
1.0000 [drp] | OPHTHALMIC | Status: AC | PRN
  Administered 2023-11-30 (×3): 1 [drp] via OPHTHALMIC

## 2023-11-30 MED ORDER — MIDAZOLAM HCL 2 MG/2ML IJ SOLN
INTRAMUSCULAR | Status: AC
  Filled 2023-11-30: qty 2

## 2023-11-30 MED ORDER — LIDOCAINE HCL 3.5 % OP GEL
1.0000 | Freq: Once | OPHTHALMIC | Status: AC
  Administered 2023-11-30: 1 via OPHTHALMIC

## 2023-11-30 SURGICAL SUPPLY — 16 items
CATARACT SUITE SIGHTPATH (MISCELLANEOUS) ×1 IMPLANT
CLIP IPRISM (KITS) IMPLANT
CLOTH BEACON ORANGE TIMEOUT ST (SAFETY) ×2 IMPLANT
EYE SHIELD UNIVERSAL CLEAR (GAUZE/BANDAGES/DRESSINGS) IMPLANT
FEE CATARACT SUITE SIGHTPATH (MISCELLANEOUS) ×2 IMPLANT
GLOVE BIOGEL PI IND STRL 7.0 (GLOVE) ×4 IMPLANT
LENS IOL TECNIS EYHANCE 20.0 (Intraocular Lens) IMPLANT
LOOP SUT CHROMIC 2-0 SGL1 (SUTURE) IMPLANT
NDL HYPO 18GX1.5 BLUNT FILL (NEEDLE) ×4 IMPLANT
NEEDLE HYPO 18GX1.5 BLUNT FILL (NEEDLE) ×2 IMPLANT
PAD ARMBOARD 7.5X6 YLW CONV (MISCELLANEOUS) ×2 IMPLANT
RING MALYGIN 7.0 (MISCELLANEOUS) IMPLANT
SYR TB 1ML LL NO SAFETY (SYRINGE) ×2 IMPLANT
TAPE SURG TRANSPORE 1 IN (GAUZE/BANDAGES/DRESSINGS) IMPLANT
TREPHINE GLAUKO IACCESS TRBCLR (OPHTHALMIC) IMPLANT
WATER STERILE IRR 250ML POUR (IV SOLUTION) ×2 IMPLANT

## 2023-11-30 NOTE — Op Note (Signed)
 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 Yolando Gillum, MD, MA  Anesthesia: MAC, Topical Akten  Complications: None  Estimated Blood Loss: <24mL (minimal)  Specimens: None  Implants: As above  Indications:  Visually significant age-related cataract, Left Eye; Glaucoma, Left Eye  Procedure:  The patient was seen and identified in the pre-operative area. The operative eye was identified and dilated.  The operative eye was marked.  Topical anesthesia was administered to the operative eye.     The patient was then to the operative suite and placed in the supine position.  A timeout was performed confirming the patient, procedure to be performed, and all other relevant information.   The patient's face was prepped and draped in the usual fashion for intra-ocular surgery.  A lid speculum was placed into the operative eye and the surgical microscope moved into place and focused. An inferotemporal paracentesis was created using a 20 gauge paracentesis blade.  Shugarcaine was injected into the anterior chamber.  Viscoelastic was injected into the anterior chamber.  A temporal clear-corneal main wound incision was created using a 2.29mm microkeratome. A 7.40mm Malyugin ring was placed. A continuous curvilinear capsulorrhexis was initiated using an irrigating cystitome and completed using capsulorrhexis forceps.   Hydrodissection and hydrodeliniation were performed.  Viscoelastic was injected into the anterior chamber.  A phacoemulsification handpiece and a chopper as a second instrument were used to remove the nucleus and epinucleus. The irrigation/aspiration handpiece was used to remove any remaining cortical material.   The capsular bag was reinflated with viscoelastic, checked, and found to be intact.  The intraocular lens was inserted into the capsular bag and dialed into place using a Kuglen hook.    The patient's head was repositioned.  The iAccess was used to extensively treat the nasal trabecular meshwork for 90 degrees for a total of 6 goniotomies under gonioscopy.  The patient's head was returned to neutral. The Malyugin ring was removed.The irrigation/aspiration handpiece was used to remove any remaining viscoelastic.  The clear corneal wound and paracentesis wounds were then hydrated and checked with Weck-Cels to be watertight. 0.24mL of Moxfloxacin was injected into the anterior chamber. The lid-speculum was removed.  The drape was removed.  The patient's face was cleaned with a wet and dry 4x4. A clear shield was taped over the eye. The patient was taken to the post-operative care unit in good condition, having tolerated the procedure well.  Post-Op Instructions: The patient will follow up at [facility] for a same day post-operative evaluation and will receive all other orders and instructions.

## 2023-11-30 NOTE — Anesthesia Postprocedure Evaluation (Signed)
 Anesthesia Post Note  Patient: Jeffrey Krueger  Procedure(s) Performed: PHACOEMULSIFICATION, CATARACT, WITH IOL INSERTION. (Left: Eye) GONIOTOMY (Left: Eye)  Patient location during evaluation: Short Stay Anesthesia Type: MAC Level of consciousness: awake and alert Pain management: pain level controlled Vital Signs Assessment: post-procedure vital signs reviewed and stable Respiratory status: spontaneous breathing Cardiovascular status: stable Postop Assessment: no apparent nausea or vomiting Anesthetic complications: no   No notable events documented.   Last Vitals:  Vitals:   11/30/23 1340 11/30/23 1451  BP: 124/81 104/87  Pulse: 90 95  Resp: (!) 22 (!) 22  Temp: 36.8 C 37.2 C  SpO2: 100% 100%    Last Pain:  Vitals:   11/30/23 1451  TempSrc: Oral  PainSc: 0-No pain                 Cookie Pore

## 2023-11-30 NOTE — Discharge Instructions (Addendum)
 Please discharge patient when stable, will follow up today with Dr. June Leap at the [facility] office immediately following discharge.  Leave shield in place until visit.  All paperwork with discharge instructions will be given at the office.  [facility] Address:  [address]  [place], [place] [address]

## 2023-11-30 NOTE — Transfer of Care (Signed)
 Immediate Anesthesia Transfer of Care Note  Patient: Jeffrey Krueger  Procedure(s) Performed: PHACOEMULSIFICATION, CATARACT, WITH IOL INSERTION. (Left: Eye) GONIOTOMY (Left: Eye)  Patient Location: Short Stay  Anesthesia Type:MAC  Level of Consciousness: awake  Airway & Oxygen Therapy: Patient Spontanous Breathing  Post-op Assessment: Report given to RN  Post vital signs: Reviewed and stable  Last Vitals:  Vitals Value Taken Time  BP 104/87 11/30/23 1451  Temp 37.2 C 11/30/23 1451  Pulse 95 11/30/23 1451  Resp 22 11/30/23 1451  SpO2 100 % 11/30/23 1451    Last Pain:  Vitals:   11/30/23 1451  TempSrc: Oral  PainSc: 0-No pain      Patients Stated Pain Goal: 9 (11/30/23 1340)  Complications: No notable events documented.

## 2023-11-30 NOTE — Interval H&P Note (Signed)
 History and Physical Interval Note:  11/30/2023 2:16 PM  Jeffrey Krueger  has presented today for surgery, with the diagnosis of combined forms age related cataract, left eye ocular hypertension, left eye.  The various methods of treatment have been discussed with the patient and family. After consideration of risks, benefits and other options for treatment, the patient has consented to  Procedure(s): PHACOEMULSIFICATION, CATARACT, WITH IOL INSERTION (Left) GONIOTOMY (Left) as a surgical intervention.  The patient's history has been reviewed, patient examined, no change in status, stable for surgery.  I have reviewed the patient's chart and labs.  Questions were answered to the patient's satisfaction.     Fabio Pierce

## 2023-11-30 NOTE — Anesthesia Preprocedure Evaluation (Signed)
 Anesthesia Evaluation  Patient identified by MRN, date of birth, ID band Patient awake    Reviewed: Allergy & Precautions, H&P , NPO status , Patient's Chart, lab work & pertinent test results  Airway Mallampati: II  TM Distance: >3 FB Neck ROM: Full    Dental  (+) Edentulous Upper, Edentulous Lower   Pulmonary shortness of breath, with exertion, at rest, lying and Long-Term Oxygen Therapy, COPD, Current Smoker and Patient abstained from smoking.  Decreased BS bilaterally   + wheezing      Cardiovascular negative cardio ROS Normal cardiovascular exam Rhythm:Regular Rate:Normal     Neuro/Psych  Headaches PSYCHIATRIC DISORDERS Anxiety Depression     Neuromuscular disease    GI/Hepatic negative GI ROS, Neg liver ROS,,,  Endo/Other  negative endocrine ROS    Renal/GU negative Renal ROS  negative genitourinary   Musculoskeletal  (+) Arthritis ,    Abdominal   Peds negative pediatric ROS (+)  Hematology negative hematology ROS (+)   Anesthesia Other Findings   Reproductive/Obstetrics negative OB ROS                              Anesthesia Physical Anesthesia Plan  ASA: 4  Anesthesia Plan: MAC   Post-op Pain Management: Minimal or no pain anticipated   Induction: Intravenous  PONV Risk Score and Plan:   Airway Management Planned: Nasal Cannula  Additional Equipment:   Intra-op Plan:   Post-operative Plan:   Informed Consent: I have reviewed the patients History and Physical, chart, labs and discussed the procedure including the risks, benefits and alternatives for the proposed anesthesia with the patient or authorized representative who has indicated his/her understanding and acceptance.       Plan Discussed with: CRNA  Anesthesia Plan Comments:         Anesthesia Quick Evaluation

## 2023-12-03 ENCOUNTER — Encounter (HOSPITAL_COMMUNITY): Payer: Self-pay | Admitting: Ophthalmology

## 2023-12-04 ENCOUNTER — Other Ambulatory Visit: Payer: Self-pay | Admitting: Internal Medicine

## 2023-12-04 DIAGNOSIS — K2950 Unspecified chronic gastritis without bleeding: Secondary | ICD-10-CM

## 2023-12-04 NOTE — Telephone Encounter (Signed)
 Copied from CRM [id number]. Topic: Clinical - Medication Refill >> Dec 04, 2023  9:31 AM Elle L wrote: Most Recent Primary Care Visit:  Provider: Anabel Halon  Department: [facility]  Visit Type: OFFICE VISIT  Date: 07/19/2023  Medication: omeprazole (PRILOSEC) 40 MG capsule  Has the patient contacted their pharmacy? Yes  Is this the correct pharmacy for this prescription? Yes  This is the patient's preferred pharmacy:  [facility] - [place], [place] - [address] [place] [address] Phone: [phone] Fax: [phone]  Has the prescription been filled recently? No  Is the patient out of the medication? Yes, took the last pill this morning.   Has the patient been seen for an appointment in the last year OR does the patient have an upcoming appointment? Yes  Can we respond through MyChart? Yes  Agent: Please be advised that Rx refills may take up to 3 business days. We ask that you follow-up with your pharmacy.

## 2023-12-05 ENCOUNTER — Ambulatory Visit: Payer: Medicare HMO | Admitting: Emergency Medicine

## 2023-12-05 ENCOUNTER — Encounter: Payer: Self-pay | Admitting: Emergency Medicine

## 2023-12-05 ENCOUNTER — Telehealth: Payer: Self-pay | Admitting: Internal Medicine

## 2023-12-05 VITALS — BP 130/64 | HR 85 | Ht 69.0 in | Wt 127.0 lb

## 2023-12-05 DIAGNOSIS — Z720 Tobacco use: Secondary | ICD-10-CM | POA: Diagnosis not present

## 2023-12-05 DIAGNOSIS — J449 Chronic obstructive pulmonary disease, unspecified: Secondary | ICD-10-CM

## 2023-12-05 DIAGNOSIS — J9611 Chronic respiratory failure with hypoxia: Secondary | ICD-10-CM

## 2023-12-05 MED ORDER — AZITHROMYCIN 250 MG PO TABS: 250.0000 mg | ORAL_TABLET | Freq: Every day | ORAL | 3 refills | Status: AC

## 2023-12-05 MED ORDER — BREZTRI AEROSPHERE 160-9-4.8 MCG/ACT IN AERO: 2.0000 | INHALATION_SPRAY | Freq: Two times a day (BID) | RESPIRATORY_TRACT | 3 refills | Status: DC

## 2023-12-05 NOTE — Patient Instructions (Addendum)
 Please continue your Breztri 2 puffs twice a day.  Rinse and gargle after using.  We will send a new prescription to [facility] so you can get it 34-month supply at a time. Continue prednisone 20 mg once daily. We will start azithromycin 250 mg once daily. Keep albuterol available to use 2 puffs or 1 nebulizer treatment up to every 4 hours if needed for shortness of breath, chest tightness, wheezing. Continue oxygen at 3 L/min at all times. Try to work on decreasing your cigarettes. Agree with starting with nicotine patch 14 mg to help you cut down. Following our office in 3 months

## 2023-12-05 NOTE — Telephone Encounter (Signed)
 Copied from CRM [id number]. Topic: Clinical - Prescription Issue >> Dec 05, 2023  4:33 PM Tiffany S wrote: Reason for CRM: Patient called about refill omeprazole (PRILOSEC) 40 MG capsule [[id number]] Pharmacy told the patient it was denied. Patient requesting call back

## 2023-12-05 NOTE — Assessment & Plan Note (Signed)
Continue oxygen at 3 L/min at all times 

## 2023-12-05 NOTE — Assessment & Plan Note (Signed)
 End-stage COPD.  I have spoken to him before about goals of care, have recommended that he would not tolerate ACLS.  At this time he is still full code.  Will try to add as much as possible to help his daily symptoms.  Will start with azithromycin.  I talked to him also about Daliresp but he is concerned about the GI side effects, diarrhea.  Please continue your Breztri 2 puffs twice a day.  Rinse and gargle after using.  We will send a new prescription to [facility] so you can get it 60-month supply at a time. Continue prednisone 20 mg once daily. We will start azithromycin 250 mg once daily. Keep albuterol available to use 2 puffs or 1 nebulizer treatment up to every 4 hours if needed for shortness of breath, chest tightness, wheezing. Following our office in 3 months

## 2023-12-05 NOTE — Assessment & Plan Note (Signed)
 Try to work on decreasing your cigarettes. Agree with starting with nicotine patch 14 mg to help you cut down.

## 2023-12-05 NOTE — Progress Notes (Signed)
   Subjective:    Patient ID: Jeffrey Krueger, male    DOB: Apr 16, 1956, 68 y.o.   MRN: [id number]  HPI  ROV 06/07/2023 --follow-up visit for Jeffrey Krueger.  He is 60 with end-stage COPD, continued tobacco use, associated chronic hypoxemic respiratory failure.  He is on chronic prednisone, has been on pred 20mg  every day. He remains on Breztri. Uses albuterol about 3x a day.  He wants to change oxygen companies from [place] to [employer], needs a walking oximetry today He was trying to get either a power chair or scooter.  This was ordered on 05/25/2023, likely will need dedicated paperwork  ROV 12/05/2023 -68 year old male with end-stage COPD and continued tobacco use.  He has associated chronic hypoxemic respiratory failure on 3 L/min.  He is on scheduled prednisone 20 mg daily.  Has been managed with Breztri.he has severe daily symptoms, some good days and bad days. No abx or pred burst since last time.    Review of Systems As per HPI     Objective:   Physical Exam Vitals:   12/05/23 1410  BP: 130/64  Pulse: 85  SpO2: 98%  Weight: 127 lb (57.6 kg)  Height: 5\' 9"  (1.753 m)    Gen: Pleasant, cachectic, in no distress,  normal affect  ENT: No lesions,  mouth clear,  oropharynx clear, no postnasal drip  Neck: No JVD, hoarse voice, some mild upper airway noise  Lungs: Mild accessory muscle use, no crackles. He does have B wheezes on normal breath and forced exp  Cardiovascular: RRR, heart sounds normal, no murmur or gallops, no peripheral edema  Musculoskeletal: No deformities, no cyanosis or clubbing  Neuro: alert, awake, non focal  Skin: Warm, no lesions or rash      Assessment & Plan:  COPD (chronic obstructive pulmonary disease) (HCC) End-stage COPD.  I have spoken to him before about goals of care, have recommended that he would not tolerate ACLS.  At this time he is still full code.  Will try to add as much as possible to help his daily symptoms.  Will start with azithromycin.  I  talked to him also about Daliresp but he is concerned about the GI side effects, diarrhea.  Please continue your Breztri 2 puffs twice a day.  Rinse and gargle after using.  We will send a new prescription to [facility] so you can get it 31-month supply at a time. Continue prednisone 20 mg once daily. We will start azithromycin 250 mg once daily. Keep albuterol available to use 2 puffs or 1 nebulizer treatment up to every 4 hours if needed for shortness of breath, chest tightness, wheezing. Following our office in 3 months  Chronic respiratory failure with hypoxia (HCC) Continue oxygen at 3 L/min at all times.  Tobacco abuse Try to work on decreasing your cigarettes. Agree with starting with nicotine patch 14 mg to help you cut down.       Levy Pupa, MD, PhD 12/05/2023, 2:32 PM [facility] [phone] or if no answer before 7:00PM call [phone] For any issues after 7:00PM please call eLink [phone]

## 2023-12-06 ENCOUNTER — Other Ambulatory Visit: Payer: Self-pay

## 2023-12-06 DIAGNOSIS — K2950 Unspecified chronic gastritis without bleeding: Secondary | ICD-10-CM

## 2023-12-06 MED ORDER — OMEPRAZOLE 40 MG PO CPDR: 40.0000 mg | DELAYED_RELEASE_CAPSULE | Freq: Every day | ORAL | 3 refills | Status: DC

## 2023-12-06 NOTE — Telephone Encounter (Signed)
 Refills sent to pharmacy.

## 2023-12-25 DIAGNOSIS — J449 Chronic obstructive pulmonary disease, unspecified: Secondary | ICD-10-CM | POA: Diagnosis not present

## 2024-01-01 ENCOUNTER — Encounter: Payer: Self-pay | Admitting: *Deleted

## 2024-01-02 ENCOUNTER — Other Ambulatory Visit: Payer: Self-pay | Admitting: Internal Medicine

## 2024-01-02 DIAGNOSIS — F419 Anxiety disorder, unspecified: Secondary | ICD-10-CM

## 2024-01-14 ENCOUNTER — Other Ambulatory Visit: Payer: Self-pay | Admitting: Internal Medicine

## 2024-01-14 DIAGNOSIS — J449 Chronic obstructive pulmonary disease, unspecified: Secondary | ICD-10-CM

## 2024-01-17 ENCOUNTER — Encounter: Payer: Self-pay | Admitting: Internal Medicine

## 2024-01-17 ENCOUNTER — Telehealth (INDEPENDENT_AMBULATORY_CARE_PROVIDER_SITE_OTHER): Payer: Medicare HMO | Admitting: Internal Medicine

## 2024-01-17 ENCOUNTER — Telehealth: Payer: Self-pay | Admitting: Internal Medicine

## 2024-01-17 DIAGNOSIS — E440 Moderate protein-calorie malnutrition: Secondary | ICD-10-CM

## 2024-01-17 DIAGNOSIS — J441 Chronic obstructive pulmonary disease with (acute) exacerbation: Secondary | ICD-10-CM | POA: Insufficient documentation

## 2024-01-17 DIAGNOSIS — J9611 Chronic respiratory failure with hypoxia: Secondary | ICD-10-CM

## 2024-01-17 DIAGNOSIS — J309 Allergic rhinitis, unspecified: Secondary | ICD-10-CM

## 2024-01-17 DIAGNOSIS — F419 Anxiety disorder, unspecified: Secondary | ICD-10-CM | POA: Diagnosis not present

## 2024-01-17 DIAGNOSIS — F3341 Major depressive disorder, recurrent, in partial remission: Secondary | ICD-10-CM

## 2024-01-17 MED ORDER — LEVOCETIRIZINE DIHYDROCHLORIDE 5 MG PO TABS
5.0000 mg | ORAL_TABLET | Freq: Every evening | ORAL | 1 refills | Status: DC
Start: 2024-01-17 — End: 2024-07-01

## 2024-01-17 MED ORDER — PREDNISONE 10 MG (21) PO TBPK: ORAL_TABLET | ORAL | 0 refills | Status: DC

## 2024-01-17 NOTE — Patient Instructions (Signed)
 Please start taking prednisone  Dosepak as prescribed.  After completing the Dosepak, you can start taking regular prednisone  20 mg once daily.  Please start taking Xyzal  for allergies.  Please try to cut down -> quit smoking.  Please continue to take other medications as prescribed.

## 2024-01-17 NOTE — Assessment & Plan Note (Signed)
 Has been 2-3 l O2 at home since hospitalization for COPD exacerbation in 08/2020 Underlying COPD and continuous tobacco abuse Albuterol  nebulizer PRN, supplies sent

## 2024-01-17 NOTE — Progress Notes (Signed)
 Virtual Visit via Video Note   Because of Jeffrey Krueger's co-morbid illnesses, he is at least at moderate risk for complications without adequate follow up.  This format is felt to be most appropriate for this patient at this time.  All issues noted in this document were discussed and addressed.  A limited physical exam was performed with this format.      Evaluation Performed:  Follow-up visit  Date:  01/17/2024   ID:  Jeffrey Krueger, Jeffrey Krueger, Jeffrey Krueger, Jeffrey Krueger  Patient Location: Home Provider Location: Office/Clinic  Participants: Patient Location of Patient: Home Location of Provider: Telehealth Consent was obtain for visit to be over via telehealth. I verified that I am speaking with the correct person using two identifiers.  PCP:  Jeffrey Sport, MD   Chief Complaint: Follow up of his chronic medical conditions  History of Present Illness:    Jeffrey Krueger is a 68 y.o. male with PMH of COPD, hypoxic respiratory failure, anxiety and tobacco abuse who has a video visit for f/u of his chronic medical conditions.  COPD: She reports recent worsening of dyspnea with minimal exertion and has chronic cough.  He also reports nasal congestion and sinus pressure related headache.  He has been using Breztri  and as needed albuterol  for COPD. He is on oral prednisone  for chronic hypoxia. He is also placed on oral Azithromycin  250 mg once daily. He follows up with Pulmonology for COPD and chronic hypoxic respiratory failure. He has continuous home O2 for it now.  He still smokes about 0.5 pack/day.  He complains of daytime fatigue.  He currently takes Wellbutrin  200 mg BID for GAD and trazodone  100 mg nightly for insomnia. He denies any anhedonia.  He has anxiety at nighttime when he has to use O2 while sleeping. He denies any SI or HI currently.  The patient does not have symptoms concerning for COVID-19 infection (fever, chills, cough, or new shortness of breath).   Past Medical,  Surgical, Social History, Allergies, and Medications have been Reviewed.  Past Medical History:  Diagnosis Date   COPD (chronic obstructive pulmonary disease) (HCC)    Headache(784.0)    Shortness of breath    Past Surgical History:  Procedure Laterality Date   BACK SURGERY     CATARACT EXTRACTION EXTRACAPSULAR Right 11/05/2023   Procedure: CATARACT EXTRACTION EXTRACAPSULAR WITH INTRAOCULAR LENS PLACEMENT (IOC);  Surgeon: Jeffrey Farm, MD;  Location: [facility];  Service: Ophthalmology;  Laterality: Right;  CDE: 22.95   CATARACT EXTRACTION W/PHACO Left 11/30/2023   Procedure: PHACOEMULSIFICATION, CATARACT, WITH IOL INSERTION.;  Surgeon: Jeffrey Farm, MD;  Location: [facility];  Service: Ophthalmology;  Laterality: Left;  CDE: 13.65     Current Meds  Medication Sig   albuterol  (PROVENTIL ) (2.5 MG/3ML) 0.083% nebulizer solution INHALE CONTENTS OF 1 VIAL IN NEBULIZER EVERY 4 HOURS IF NEEDED FOR WHEEZING   albuterol  (VENTOLIN  HFA) 108 (90 Base) MCG/ACT inhaler INHALE 2 PUFFS BY MOUTH EVERY 4 HOURS AS NEEDED   azithromycin  (ZITHROMAX ) 250 MG tablet Take 1 tablet (250 mg total) by mouth daily.   budeson-glycopyrrolate-formoterol  (BREZTRI  AEROSPHERE) 160-9-4.8 MCG/ACT AERO Inhale 2 puffs into the lungs in the morning and at bedtime.   buPROPion  (WELLBUTRIN  SR) 200 MG Krueger hr tablet TAKE 1 TABLET BY MOUTH TWICE DAILY   levocetirizine (XYZAL ) 5 MG tablet Take 1 tablet (5 mg total) by mouth every evening.   Misc. Devices (SUCTION GRAB BAR) MISC Use it as directed to prevent  fall.   omeprazole  (PRILOSEC) 40 MG capsule Take 1 capsule (40 mg total) by mouth daily.   OXYGEN  Inhale into the lungs. 3 liters   predniSONE  (DELTASONE ) 20 MG tablet Take 1 tablet (20 mg total) by mouth daily with breakfast.   predniSONE  (STERAPRED UNI-PAK 21 TAB) 10 MG (21) TBPK tablet Take as package instructions.   Respiratory Therapy Supplies (NEBULIZER AIR TUBE/PLUGS) MISC For Albuterol  nebulization.   Respiratory Therapy  Supplies (NEBULIZER MASK ADULT) MISC For Albuterol  nebulization.   traZODone  (DESYREL ) 100 MG tablet Take 1 tablet (100 mg total) by mouth at bedtime.   UNABLE TO FIND 1 each by Does not apply route daily. Med Name: Raised Toilet Seat   UNABLE TO FIND 1 each by Does not apply route daily. Med Name: International aid/development worker for Bathroom     Allergies:   Patient has no known allergies.   ROS:   Please see the history of present illness. All other systems reviewed and are negative.   Labs/Other Tests and Data Reviewed:    Recent Labs: 04/19/2023: ALT Krueger; BUN 18; Creatinine, Ser 0.95; Hemoglobin 13.6; Platelets 264; Potassium 3.6; Sodium 141; TSH 1.260   Recent Lipid Panel Lab Results  Component Value Date/Time   CHOL 163 04/19/2023 03:32 PM   TRIG 62 04/19/2023 03:32 PM   HDL 52 04/19/2023 03:32 PM   CHOLHDL 3.1 04/19/2023 03:32 PM   CHOLHDL 2.8 06/18/2013 08:44 AM   LDLCALC 99 04/19/2023 03:32 PM    Wt Readings from Last 3 Encounters:  03/Krueger/25 127 lb (57.6 kg)  11/27/23 128 lb (58.1 kg)  11/16/23 128 lb (58.1 kg)     Objective:    Vital Signs:  There were no vitals taken for this visit.   VITAL SIGNS:  reviewed GEN:  no acute distress EYES:  sclerae anicteric, EOMI - Extraocular Movements Intact RESPIRATORY:  Has 3 LPM home O2.  Able to speak in short sentences.  No audible wheezing. NEURO:  alert and oriented x 3, no obvious focal deficit PSYCH:  normal affect  ASSESSMENT & PLAN:    Anxiety Overall well-controlled with Wellbutrin  200 mg twice daily On trazodone  100 mg nightly for insomnia  Chronic respiratory failure with hypoxia (HCC) Has been 2-3 l O2 at home since hospitalization for COPD exacerbation in Krueger/2021 Underlying COPD and continuous tobacco abuse Albuterol  nebulizer PRN, supplies sent  COPD with acute exacerbation (HCC) Recent worsening of dyspnea and chronic cough -he is already on oral azithromycin  250 mg once daily Started Sterapred Dosepak, later transition  to oral prednisone  20 mg QD as prescribed by pulmonology Check CXR On Breztri  and as needed albuterol  Also uses albuterol  neb Followed by Pulmonology -last visit visit note reviewed  Moderate malnutrition (HCC) Has hypoalbuminemia and muscle wasting Currently wheelchair dependent due to DDD of lumbosacral area, OA of knee and chronic hypoxic respiratory failure Needs to maintain adequate hydration and eat at regular intervals Chronic oral steroids can also contribute to muscle wasting Advised to take protein supplement  Recurrent major depressive disorder, in partial remission (HCC) Overall well controlled with Wellbutrin  200 mg BID, but still sometimes takes it QD On trazodone  100 mg nightly due to persistent insomnia   I discussed the assessment and treatment plan with the patient. The patient was provided an opportunity to ask questions, and all were answered. The patient agreed with the plan and demonstrated an understanding of the instructions.   The patient was advised to call back or seek an in-person evaluation  if the symptoms worsen or if the condition fails to improve as anticipated.  The above assessment and management plan was discussed with the patient. The patient verbalized understanding of and has agreed to the management plan.   Medication Adjustments/Labs and Tests Ordered: Current medicines are reviewed at length with the patient today.  Concerns regarding medicines are outlined above.   Tests Ordered: Orders Placed This Encounter  Procedures   DG Chest 2 View    Medication Changes: Meds ordered this encounter  Medications   predniSONE  (STERAPRED UNI-PAK 21 TAB) 10 MG (21) TBPK tablet    Sig: Take as package instructions.    Dispense:  1 each    Refill:  0   levocetirizine (XYZAL ) 5 MG tablet    Sig: Take 1 tablet (5 mg total) by mouth every evening.    Dispense:  90 tablet    Refill:  1     Note: This dictation was prepared with Dragon dictation  along with smaller phrase technology. Similar sounding words can be transcribed inadequately or may not be corrected upon review. Any transcriptional errors that result from this process are unintentional.      Disposition:  Follow up  Signed, Jeffrey Sport, MD  01/17/2024 11:00 AM     [facility]

## 2024-01-17 NOTE — Assessment & Plan Note (Addendum)
 Recent worsening of dyspnea and chronic cough -he is already on oral azithromycin  250 mg once daily Started Sterapred Dosepak, later transition to oral prednisone  20 mg QD as prescribed by pulmonology Check CXR On Breztri  and as needed albuterol  Also uses albuterol  neb Followed by Pulmonology -last visit visit note reviewed

## 2024-01-17 NOTE — Telephone Encounter (Signed)
 Copied from CRM [id number]. Topic: Clinical - Medication Question >> Jan 17, 2024  3:20 PM Leory Rands wrote: Reason for CRM: patient is calling to ask how much does he need to take of predniSONE  (STERAPRED UNI-PAK 21 TAB) 10 MG (21) TBPK tablet [[id number]]

## 2024-01-17 NOTE — Assessment & Plan Note (Signed)
 Overall well controlled with Wellbutrin  200 mg BID, but still sometimes takes it QD On trazodone  100 mg nightly due to persistent insomnia

## 2024-01-17 NOTE — Assessment & Plan Note (Signed)
 Overall well-controlled with Wellbutrin  200 mg twice daily On trazodone  100 mg nightly for insomnia

## 2024-01-17 NOTE — Assessment & Plan Note (Signed)
 Has hypoalbuminemia and muscle wasting Currently wheelchair dependent due to DDD of lumbosacral area, OA of knee and chronic hypoxic respiratory failure Needs to maintain adequate hydration and eat at regular intervals Chronic oral steroids can also contribute to muscle wasting Advised to take protein supplement

## 2024-01-24 DIAGNOSIS — J449 Chronic obstructive pulmonary disease, unspecified: Secondary | ICD-10-CM | POA: Diagnosis not present

## 2024-02-01 ENCOUNTER — Other Ambulatory Visit: Payer: Self-pay | Admitting: Internal Medicine

## 2024-02-01 DIAGNOSIS — J019 Acute sinusitis, unspecified: Secondary | ICD-10-CM

## 2024-02-06 ENCOUNTER — Other Ambulatory Visit: Payer: Self-pay

## 2024-02-06 ENCOUNTER — Telehealth: Payer: Self-pay | Admitting: Internal Medicine

## 2024-02-06 DIAGNOSIS — J449 Chronic obstructive pulmonary disease, unspecified: Secondary | ICD-10-CM

## 2024-02-06 MED ORDER — ALBUTEROL SULFATE (2.5 MG/3ML) 0.083% IN NEBU: 2.5000 mg | INHALATION_SOLUTION | RESPIRATORY_TRACT | 3 refills | Status: DC | PRN

## 2024-02-06 NOTE — Telephone Encounter (Unsigned)
 Copied from CRM [id number]. Topic: Clinical - Medication Refill >> Feb 06, 2024 11:36 AM Alpha Arts wrote: Medication: albuterol  (PROVENTIL ) (2.5 MG/3ML) 0.083% nebulizer solution   Has the patient contacted their pharmacy? Yes (Agent: If no, request that the patient contact the pharmacy for the refill. If patient does not wish to contact the pharmacy document the reason why and proceed with request.) (Agent: If yes, when and what did the pharmacy advise?)  This is the patient's preferred pharmacy:  [facility] - [place], [place] - [address] [place] [address] Phone: [phone] Fax: [phone]   Is this the correct pharmacy for this prescription? Yes If no, delete pharmacy and type the correct one.   Has the prescription been filled recently? Yes  Is the patient out of the medication? Yes  Has the patient been seen for an appointment in the last year OR does the patient have an upcoming appointment? Yes  Can we respond through MyChart? Yes  Agent: Please be advised that Rx refills may take up to 3 business days. We ask that you follow-up with your pharmacy.

## 2024-02-06 NOTE — Telephone Encounter (Signed)
 Refill sent.

## 2024-02-24 DIAGNOSIS — J449 Chronic obstructive pulmonary disease, unspecified: Secondary | ICD-10-CM | POA: Diagnosis not present

## 2024-03-04 DIAGNOSIS — Z720 Tobacco use: Secondary | ICD-10-CM | POA: Diagnosis not present

## 2024-03-04 DIAGNOSIS — J449 Chronic obstructive pulmonary disease, unspecified: Secondary | ICD-10-CM | POA: Diagnosis not present

## 2024-03-04 DIAGNOSIS — I10 Essential (primary) hypertension: Secondary | ICD-10-CM | POA: Diagnosis not present

## 2024-03-04 DIAGNOSIS — R918 Other nonspecific abnormal finding of lung field: Secondary | ICD-10-CM | POA: Diagnosis not present

## 2024-03-04 DIAGNOSIS — F17210 Nicotine dependence, cigarettes, uncomplicated: Secondary | ICD-10-CM | POA: Diagnosis not present

## 2024-03-04 DIAGNOSIS — R069 Unspecified abnormalities of breathing: Secondary | ICD-10-CM | POA: Diagnosis not present

## 2024-03-04 DIAGNOSIS — R062 Wheezing: Secondary | ICD-10-CM | POA: Diagnosis not present

## 2024-03-04 DIAGNOSIS — R0602 Shortness of breath: Secondary | ICD-10-CM | POA: Diagnosis not present

## 2024-03-04 DIAGNOSIS — J438 Other emphysema: Secondary | ICD-10-CM | POA: Diagnosis not present

## 2024-03-04 DIAGNOSIS — R0902 Hypoxemia: Secondary | ICD-10-CM | POA: Diagnosis not present

## 2024-03-05 ENCOUNTER — Other Ambulatory Visit: Payer: Self-pay | Admitting: Internal Medicine

## 2024-03-05 ENCOUNTER — Other Ambulatory Visit: Payer: Self-pay | Admitting: Emergency Medicine

## 2024-03-05 DIAGNOSIS — J449 Chronic obstructive pulmonary disease, unspecified: Secondary | ICD-10-CM

## 2024-03-05 DIAGNOSIS — F419 Anxiety disorder, unspecified: Secondary | ICD-10-CM

## 2024-03-05 DIAGNOSIS — K2950 Unspecified chronic gastritis without bleeding: Secondary | ICD-10-CM

## 2024-03-11 ENCOUNTER — Other Ambulatory Visit: Payer: Self-pay

## 2024-03-11 DIAGNOSIS — F3341 Major depressive disorder, recurrent, in partial remission: Secondary | ICD-10-CM

## 2024-03-11 MED ORDER — TRAZODONE HCL 100 MG PO TABS: 100.0000 mg | ORAL_TABLET | Freq: Every day | ORAL | 1 refills | Status: DC

## 2024-03-12 ENCOUNTER — Encounter: Payer: Self-pay | Admitting: Emergency Medicine

## 2024-03-12 ENCOUNTER — Ambulatory Visit (INDEPENDENT_AMBULATORY_CARE_PROVIDER_SITE_OTHER): Admitting: Emergency Medicine

## 2024-03-12 ENCOUNTER — Telehealth: Payer: Self-pay

## 2024-03-12 VITALS — BP 178/79 | HR 99 | Ht 69.0 in | Wt 130.0 lb

## 2024-03-12 DIAGNOSIS — J449 Chronic obstructive pulmonary disease, unspecified: Secondary | ICD-10-CM | POA: Diagnosis not present

## 2024-03-12 DIAGNOSIS — J9611 Chronic respiratory failure with hypoxia: Secondary | ICD-10-CM | POA: Diagnosis not present

## 2024-03-12 DIAGNOSIS — Z720 Tobacco use: Secondary | ICD-10-CM

## 2024-03-12 NOTE — Assessment & Plan Note (Signed)
 Continue your oxygen  at all times, 3 L/min

## 2024-03-12 NOTE — Assessment & Plan Note (Signed)
 Congratulations on decreasing your cigarettes.  Agree with using nicotine  patch to help you stop.  Ultimate goal will be to quit altogether.

## 2024-03-12 NOTE — Patient Instructions (Signed)
 Please continue Bevespi 2 puffs twice a day.  Rinse and gargle after using. Keep albuterol  available to use 2 puffs or 1 nebulizer treatment up to every 4 hours if needed for shortness of breath, chest tightness, wheezing.  Continue prednisone  20 mg once daily. Continue azithromycin  250 mg once daily We will work on obtaining a new nebulizer medication called Ohtuvayre .  Take 1 nebulizer treatment twice a day.  Keep track of how this medication impacts your breathing so we can discuss it next time. Continue your oxygen  at all times, 3 L/min Congratulations on decreasing your cigarettes.  Agree with using nicotine  patch to help you stop.  Ultimate goal will be to quit altogether. Follow in our office in 2 months so we can see how you are doing on the new medication.  Please call sooner if you have problems.

## 2024-03-12 NOTE — Assessment & Plan Note (Signed)
 Please continue Bevespi 2 puffs twice a day.  Rinse and gargle after using. Keep albuterol  available to use 2 puffs or 1 nebulizer treatment up to every 4 hours if needed for shortness of breath, chest tightness, wheezing.  Continue prednisone  20 mg once daily. Continue azithromycin  250 mg once daily We will work on obtaining a new nebulizer medication called Ohtuvayre .  Take 1 nebulizer treatment twice a day.  Keep track of how this medication impacts your breathing so we can discuss it next time. Follow in our office in 2 months so we can see how you are doing on the new medication.  Please call sooner if you have problems.

## 2024-03-12 NOTE — Telephone Encounter (Signed)
 Ohtuvayre  paperwork handed into pharmacy

## 2024-03-12 NOTE — Progress Notes (Signed)
   Subjective:    Patient ID: Jeffrey Krueger, male    DOB: [birth year], 68 y.o.   MRN: [id number]  HPI  ROV 06/07/2023 --follow-up visit for Jeffrey Krueger.  He is 78 with end-stage COPD, continued tobacco use, associated chronic hypoxemic respiratory failure.  He is on chronic prednisone , has been on pred 20mg  every day. He remains on Breztri . Uses albuterol  about 3x a day.  He wants to change oxygen  companies from [place] [employer] to [employer], needs a walking oximetry today He was trying to get either a power chair or scooter.  This was ordered on 05/25/2023, likely will need dedicated paperwork  ROV 12/05/2023 -68 year old male with end-stage COPD and continued tobacco use.  He has associated chronic hypoxemic respiratory failure on 3 L/min.  He is on scheduled prednisone  20 mg daily, started azithro every day in March.  Has been managed with Breztri .he has severe daily symptoms, some good days and bad days. No abx or pred burst since last time. He feels that the azithro has helped, less cough and wheeze, seems to break up his mucous. No flares since last time. Uses albuterol  several times a day.   ROV 03/12/2024 --68 year old man with end-stage COPD and continued tobacco use.  He has chronic hypoxemic respiratory failure.  I have him on chronic prednisone  20 mg daily, Breztri .  He remains on oxygen  at 3 L/min.   Review of Systems As per HPI     Objective:   Physical Exam Vitals:   03/12/24 1348  BP: (!) 178/79  Pulse: 99  SpO2: 97%  Weight: 130 lb (59 kg)  Height: 5' 9 (1.753 m)    Gen: Pleasant, cachectic, in no distress,  normal affect  ENT: No lesions,  mouth clear,  oropharynx clear, no postnasal drip  Neck: No JVD, hoarse voice, some mild upper airway noise  Lungs: Mild accessory muscle use, no crackles. He does have B wheezes on normal breath and forced exp  Cardiovascular: RRR, heart sounds normal, no murmur or gallops, no peripheral edema  Musculoskeletal: No deformities, no cyanosis or  clubbing  Neuro: alert, awake, non focal  Skin: Warm, no lesions or rash      Assessment & Plan:  COPD (chronic obstructive pulmonary disease) (HCC) Please continue Bevespi 2 puffs twice a day.  Rinse and gargle after using. Keep albuterol  available to use 2 puffs or 1 nebulizer treatment up to every 4 hours if needed for shortness of breath, chest tightness, wheezing.  Continue prednisone  20 mg once daily. Continue azithromycin  250 mg once daily We will work on obtaining a new nebulizer medication called Ohtuvayre .  Take 1 nebulizer treatment twice a day.  Keep track of how this medication impacts your breathing so we can discuss it next time. Follow in our office in 2 months so we can see how you are doing on the new medication.  Please call sooner if you have problems.  Chronic respiratory failure with hypoxia (HCC) Continue your oxygen  at all times, 3 L/min  Tobacco abuse Congratulations on decreasing your cigarettes.  Agree with using nicotine  patch to help you stop.  Ultimate goal will be to quit altogether.      Racheal Buddle, MD, PhD 03/12/2024, 2:05 PM [facility] [phone] or if no answer before 7:00PM call [phone] For any issues after 7:00PM please call eLink [phone]

## 2024-03-13 ENCOUNTER — Telehealth: Payer: Self-pay

## 2024-03-13 NOTE — Telephone Encounter (Signed)
 Received Ohtuvayre new start paperwork. Completed form and faxed with clinicals and insurance card copy to [facility]   Phone#: [phone] Fax#: [phone]

## 2024-03-13 NOTE — Telephone Encounter (Signed)
 Copied from CRM [id number]. Topic: Clinical - Prescription Issue >> Mar 13, 2024 10:20 AM Jeffrey Krueger wrote: Reason for CRM: Patient states his inhaler was not called into the [facility].  Spoke with patient reminded him that the Ohtuvayre  comes from [facility]  how we discussed this at his appointment and they will contact him about it.      NFN

## 2024-03-14 NOTE — Telephone Encounter (Signed)
 Received fax from [employer] confirming receipt of Ohtuvayre  enrollment form  Siyon Linck, PharmD, MPH, BCPS, CPP Clinical Pharmacist (Rheumatology and Pulmonology)

## 2024-03-17 NOTE — Telephone Encounter (Signed)
 Received fax from [employer] with summary of benefits. Referral form for Ohtuvayre  received. Rx will be triaged to [facility].. Once benefits investigation completed, pharmacy will reach out the patient to schedule shipment. If medication is unaffordable, patient will need to express financial hardship to be referred back to [place] [facility] for patient assistance program pre-screening.   Patient ID: [id number] Pharmacy phone: [phone] [place] [facility] Phone#: [phone]

## 2024-03-25 DIAGNOSIS — J449 Chronic obstructive pulmonary disease, unspecified: Secondary | ICD-10-CM | POA: Diagnosis not present

## 2024-04-03 ENCOUNTER — Other Ambulatory Visit: Payer: Self-pay | Admitting: Internal Medicine

## 2024-04-03 DIAGNOSIS — F3341 Major depressive disorder, recurrent, in partial remission: Secondary | ICD-10-CM

## 2024-04-25 ENCOUNTER — Telehealth: Payer: Self-pay | Admitting: Internal Medicine

## 2024-04-25 DIAGNOSIS — J449 Chronic obstructive pulmonary disease, unspecified: Secondary | ICD-10-CM | POA: Diagnosis not present

## 2024-04-25 NOTE — Telephone Encounter (Signed)
 Copied from CRM [id number]. Topic: General - Call Back - No Documentation >> Apr 25, 2024 11:14 AM Wess RAMAN wrote: Reason for CRM:  Patient would like a call back in regards to a personal matter from Dr. Anthony nurse  Callback #: [phone]

## 2024-04-25 NOTE — Telephone Encounter (Signed)
 Copied from CRM [id number]. Topic: General - Call Back - No Documentation >> Apr 25, 2024 11:14 AM Jeffrey Krueger wrote: Reason for CRM:  Patient would like a call back in regards to a personal matter from Dr. Anthony nurse  Callback #: [phone] >> Apr 25, 2024  2:31 PM Jeffrey Krueger wrote: Patient called again.Jeffrey Krueger wants to talk to Dr Tobie on a personal matter.

## 2024-04-25 NOTE — Telephone Encounter (Signed)
 Spoke to patient, transferred to front to assist with mychart

## 2024-04-29 NOTE — Telephone Encounter (Signed)
 Spoke to patient, he is concerned with his mychart, transferred to the front for them to assist

## 2024-05-01 ENCOUNTER — Other Ambulatory Visit: Payer: Self-pay | Admitting: Internal Medicine

## 2024-05-01 NOTE — Telephone Encounter (Unsigned)
 Copied from CRM [id number]. Topic: Clinical - Medication Refill >> May 01, 2024  2:45 PM Santiya F wrote: Medication: predniSONE  (DELTASONE ) 20 MG tablet [[id number]]  Has the patient contacted their pharmacy? Yes  (Agent: If yes, when and what did the pharmacy advise?) Contact office   This is the patient's preferred pharmacy:  [facility] - [name], [place] - [address] [place] [address] Phone: [phone] Fax: [phone]  Is this the correct pharmacy for this prescription? Yes If no, delete pharmacy and type the correct one.   Has the prescription been filled recently? Yes  Is the patient out of the medication? No, patient has 2 pills yet.   Has the patient been seen for an appointment in the last year OR does the patient have an upcoming appointment? Yes  Can we respond through MyChart? Yes  Agent: Please be advised that Rx refills may take up to 3 business days. We ask that you follow-up with your pharmacy.

## 2024-05-02 MED ORDER — PREDNISONE 20 MG PO TABS: 20.0000 mg | ORAL_TABLET | Freq: Every day | ORAL | 3 refills | Status: AC

## 2024-05-02 NOTE — Telephone Encounter (Signed)
 Copied from CRM [id number]. Topic: Clinical - Medication Refill >> May 01, 2024  2:45 PM Santiya F wrote: Medication: predniSONE  (DELTASONE ) 20 MG tablet [[id number]]  Has the patient contacted their pharmacy? Yes  (Agent: If yes, when and what did the pharmacy advise?) Contact office   This is the patient's preferred pharmacy:  [facility] - [name], [place] - [address] [place] [address] Phone: [phone] Fax: [phone]  Is this the correct pharmacy for this prescription? Yes If no, delete pharmacy and type the correct one.   Has the prescription been filled recently? Yes  Is the patient out of the medication? No, patient has 2 pills yet.   Has the patient been seen for an appointment in the last year OR does the patient have an upcoming appointment? Yes  Can we respond through MyChart? Yes  Agent: Please be advised that Rx refills may take up to 3 business days. We ask that you follow-up with your pharmacy.

## 2024-05-10 ENCOUNTER — Other Ambulatory Visit: Payer: Self-pay | Admitting: Internal Medicine

## 2024-05-10 DIAGNOSIS — F419 Anxiety disorder, unspecified: Secondary | ICD-10-CM

## 2024-05-12 DIAGNOSIS — H40053 Ocular hypertension, bilateral: Secondary | ICD-10-CM | POA: Diagnosis not present

## 2024-05-12 DIAGNOSIS — Z961 Presence of intraocular lens: Secondary | ICD-10-CM | POA: Diagnosis not present

## 2024-05-12 DIAGNOSIS — H401131 Primary open-angle glaucoma, bilateral, mild stage: Secondary | ICD-10-CM | POA: Diagnosis not present

## 2024-05-15 NOTE — Telephone Encounter (Unsigned)
 Copied from CRM [id number]. Topic: General - Call Back - No Documentation >> May 15, 2024 10:28 AM Vena H wrote: Reason for CRM: Pt states he missed a call from office, nothing in chart showing he was called.

## 2024-05-26 DIAGNOSIS — J449 Chronic obstructive pulmonary disease, unspecified: Secondary | ICD-10-CM | POA: Diagnosis not present

## 2024-05-28 ENCOUNTER — Ambulatory Visit: Admitting: Nurse Practitioner

## 2024-05-28 ENCOUNTER — Ambulatory Visit (INDEPENDENT_AMBULATORY_CARE_PROVIDER_SITE_OTHER)

## 2024-05-28 ENCOUNTER — Other Ambulatory Visit: Payer: Self-pay | Admitting: Internal Medicine

## 2024-05-28 ENCOUNTER — Encounter: Payer: Self-pay | Admitting: Nurse Practitioner

## 2024-05-28 ENCOUNTER — Other Ambulatory Visit

## 2024-05-28 VITALS — BP 176/80 | HR 90 | Temp 97.5°F | Ht 69.0 in | Wt 128.0 lb

## 2024-05-28 DIAGNOSIS — J449 Chronic obstructive pulmonary disease, unspecified: Secondary | ICD-10-CM | POA: Diagnosis not present

## 2024-05-28 DIAGNOSIS — F17210 Nicotine dependence, cigarettes, uncomplicated: Secondary | ICD-10-CM

## 2024-05-28 DIAGNOSIS — E8770 Fluid overload, unspecified: Secondary | ICD-10-CM

## 2024-05-28 DIAGNOSIS — R911 Solitary pulmonary nodule: Secondary | ICD-10-CM

## 2024-05-28 DIAGNOSIS — R0609 Other forms of dyspnea: Secondary | ICD-10-CM

## 2024-05-28 DIAGNOSIS — J9611 Chronic respiratory failure with hypoxia: Secondary | ICD-10-CM

## 2024-05-28 DIAGNOSIS — G4734 Idiopathic sleep related nonobstructive alveolar hypoventilation: Secondary | ICD-10-CM

## 2024-05-28 DIAGNOSIS — J439 Emphysema, unspecified: Secondary | ICD-10-CM | POA: Diagnosis not present

## 2024-05-28 LAB — BASIC METABOLIC PANEL WITH GFR
BUN: 18 mg/dL (ref 6–23)
CO2: 36 meq/L — ABNORMAL HIGH (ref 19–32)
Calcium: 8.3 mg/dL — ABNORMAL LOW (ref 8.4–10.5)
Chloride: 99 meq/L (ref 96–112)
Creatinine, Ser: 1.03 mg/dL (ref 0.40–1.50)
GFR: 74.75 mL/min (ref >60.00)
Glucose, Bld: 78 mg/dL (ref 70–99)
Potassium: 3.5 meq/L (ref 3.5–5.1)
Sodium: 140 meq/L (ref 135–145)

## 2024-05-28 LAB — BRAIN NATRIURETIC PEPTIDE: Pro B Natriuretic peptide (BNP): 55 pg/mL (ref 0.0–100.0)

## 2024-05-28 NOTE — Assessment & Plan Note (Signed)
 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 No recent exacerbations. Slight improvement with addition of Ohtuvayre . Continue aggressive maintenance regimen. Action plan in place. Graded exercises encouraged

## 2024-05-28 NOTE — Assessment & Plan Note (Signed)
 Considered benign on prior follow up. Will discuss enrollment in lung cancer screening at follow up. He would be a high risk for any invasive procedures.

## 2024-05-28 NOTE — Progress Notes (Signed)
 @Patient  ID: Jeffrey Krueger, male    DOB: 02/20/1956, 68 y.o.   MRN: [id number]  No chief complaint on file.   Referring provider: Tobie Suzzane POUR, MD  HPI: 68 year old male, former smoker (quit July 2025) followed for COPD, chronic respiratory failure, benign lung nodule. Past medical history significant for substance induced mood disorder, DDD, vitamin D  deficiency, depression, anxiety, alcohol use disorder.   TEST/EVENTS:  12/06/2020 PFT: FVC 56, FEV1 32, ratio 44, DLCO 39 11/02/2022 Super D CT chest: atherosclerosis. Severe emphysema with diffuse bronchial wall thickening. Nodules stable. Dilated 4 cm ascending thoracic aorta.   03/12/2024: OV with Dr. Shelah. End stage COPD. Continued tobacco use. Chronic hypoxic respiratory failure. On chronic prednisone  20 mg daily, Breztri , azithromycin  250 mg daily. 3 lpm supplemental O2. Start Ohtuvayre  and reassess response.  05/28/2024: Today - follow up Discussed the use of AI scribe software for clinical note transcription with the patient, who gave verbal consent to proceed.  History of Present Illness Jeffrey Krueger is a 68 year old male who presents for follow up. He is accompanied by his daughter.  He was started on Ohtuvayre , a nebulized medication, during his last visit. He feels it is helping somewhat but continues to experience occasional coughing, sometimes with difficulty bringing up phlegm. He is not currently using Mucinex  but has used it in the past. He continues to take prednisone  and azithromycin  as prescribed. Breathing is stable compared to prior; high symptom burden at baseline. Gets winded with most activities. No hemoptysis and no increased shortness of breath or oxygen  requirements. He has not been using his albuterol  treatments today, only his other nebulizer. Not noticing any increased wheezing. No fevers, chills, hemoptysis. Still using Breztri  twice a day, prednisone , and azithromycin .   His legs have been swollen for about a  month, with the left leg being worse. He has not had anyone evaluate the swelling yet. His blood pressure has been high during recent visits, which he attributes to the exertion of getting out of the house and to the office plus moving around and getting in and out of his daughter's truck. He does not have a blood pressure cuff at home to monitor his levels. No headaches, vision changes, CP. No calf pain.   He is unable to lay flat due to breathing difficulties. He was supposed to have a chest x-ray three months ago to check for fluid but has not yet done so.     No Known Allergies  Immunization History  Administered Date(s) Administered   Fluad Quad(high Dose 65+) 10/18/2022   Fluad Trivalent(High Dose 65+) 07/19/2023   Influenza,inj,Quad PF,6+ Mos 08/30/2018, 11/02/2019, 10/27/2020   Influenza-Unspecified 06/26/2015, 07/27/2019   Pneumococcal Conjugate-13 12/08/2020   Pneumococcal Polysaccharide-23 09/05/2013, 04/17/2022    Past Medical History:  Diagnosis Date   COPD (chronic obstructive pulmonary disease) (HCC)    Headache(784.0)    Shortness of breath     Tobacco History: Social History   Tobacco Use  Smoking Status Every Day   Current packs/day: 0.50   Average packs/day: 0.5 packs/day for 50.3 years (25.2 ttl pk-yrs)   Types: Cigarettes   Start date: 02/06/1974  Smokeless Tobacco Never  Tobacco Comments   30 days or more stopped smoking Montia Peele CMA    18 cigs per day 12/05/23, Marcus Fries CMA   Ready to quit: Not Answered Counseling given: Not Answered Tobacco comments: 30 days or more stopped smoking Montia Peele CMA  18 cigs per  day 12/05/23, Noel McHone CMA   Outpatient Medications Prior to Visit  Medication Sig Dispense Refill   albuterol  (PROVENTIL ) (2.5 MG/3ML) 0.083% nebulizer solution INHALE THE CONTENTS OF 1 VIAL VIA NEBULIZER EVERY 6 (SIX) HOURS AS NEEDED FOR WHEEZING OR SHORTNESS OF BREATH. 450 mL 3   albuterol  (VENTOLIN  HFA) 108 (90 Base) MCG/ACT  inhaler INHALE 2 PUFFS EVERY 4 HOURS AS NEEDED 18 g 1   azithromycin  (ZITHROMAX ) 250 MG tablet Take 1 tablet (250 mg total) by mouth daily. 90 each 3   BREZTRI  AEROSPHERE 160-9-4.8 MCG/ACT AERO inhaler INHALE 2 PUFFS INTO THE LUNGS IN THE MORNING AND AT BEDTIME. 3 each 3   buPROPion  (WELLBUTRIN  SR) 200 MG 12 hr tablet TAKE 1 TABLET BY MOUTH TWICE A DAY 180 tablet 1   levocetirizine (XYZAL ) 5 MG tablet Take 1 tablet (5 mg total) by mouth every evening. 90 tablet 1   Misc. Devices (SUCTION GRAB BAR) MISC Use it as directed to prevent fall. 1 each 0   omeprazole  (PRILOSEC) 40 MG capsule TAKE 1 CAPSULE EVERY DAY 90 capsule 3   OXYGEN  Inhale into the lungs. 3 liters     predniSONE  (DELTASONE ) 20 MG tablet Take 1 tablet (20 mg total) by mouth daily with breakfast. 90 tablet 3   predniSONE  (STERAPRED UNI-PAK 21 TAB) 10 MG (21) TBPK tablet Take as package instructions. 1 each 0   Respiratory Therapy Supplies (NEBULIZER AIR TUBE/PLUGS) MISC For Albuterol  nebulization. 1 each 0   Respiratory Therapy Supplies (NEBULIZER MASK ADULT) MISC For Albuterol  nebulization. 1 each 0   traZODone  (DESYREL ) 100 MG tablet TAKE 1 TABLET BY MOUTH AT BEDTIME 90 tablet 1   UNABLE TO FIND 1 each by Does not apply route daily. Med Name: Raised Toilet Seat 1 each 0   UNABLE TO FIND 1 each by Does not apply route daily. Med Name: Everitt Eland for Bathroom 1 each 0   No facility-administered medications prior to visit.     Review of Systems: As above    Physical Exam:  BP (!) 176/80   Pulse 90   Temp (!) 97.5 F (36.4 C) (Temporal)   Ht 5' 9 (1.753 m)   Wt 128 lb (58.1 kg)   SpO2 99%   BMI 18.90 kg/m   GEN: Pleasant, interactive, well-appearing; in no acute distress HEENT:  Normocephalic and atraumatic. PERRLA. Sclera white. Nasal turbinates pink, moist and patent bilaterally. No rhinorrhea present. Oropharynx pink and moist, without exudate or edema. No lesions, ulcerations, or postnasal drip.  NECK:  Supple w/  fair ROM. No JVD present. No lymphadenopathy.   CV: RRR, no m/r/g, +2-3 pitting BLE edema. Pulses intact, +2 bilaterally. No cyanosis, pallor or clubbing. PULMONARY:  Unlabored, regular breathing. Diminished with end expiratory wheeze bilaterally A&P. No accessory muscle use.  GI: BS present and normoactive. Soft, non-tender to palpation.  MSK: No erythema, warmth or tenderness. Cap refil <2 sec all extrem. Muscle wasting  Neuro: A/Ox3. No focal deficits noted.   Skin: Warm, no lesions or rashe Psych: Normal affect and behavior. Judgement and thought content appropriate.     Lab Results:  CBC    Component Value Date/Time   WBC 10.0 04/19/2023 1532   WBC 23.3 (H) 07/07/2016 2300   RBC 4.48 04/19/2023 1532   RBC 4.72 07/07/2016 2300   HGB 13.6 04/19/2023 1532   HCT 42.2 04/19/2023 1532   PLT 264 04/19/2023 1532   MCV 94 04/19/2023 1532   MCH 30.4 04/19/2023 1532  MCH 32.2 07/07/2016 2300   MCHC 32.2 04/19/2023 1532   MCHC 34.4 07/07/2016 2300   RDW 11.8 04/19/2023 1532   LYMPHSABS 2.1 04/19/2023 1532   MONOABS 1.2 (H) 09/20/2013 1805   EOSABS 0.2 04/19/2023 1532   BASOSABS 0.1 04/19/2023 1532    BMET    Component Value Date/Time   NA 141 04/19/2023 1532   K 3.6 04/19/2023 1532   CL 97 04/19/2023 1532   CO2 32 (H) 04/19/2023 1532   GLUCOSE 79 04/19/2023 1532   GLUCOSE 183 (H) 07/07/2016 2300   BUN 18 04/19/2023 1532   CREATININE 0.95 04/19/2023 1532   CREATININE 1.17 06/18/2013 0844   CALCIUM 8.8 04/19/2023 1532   GFRNONAA >60 07/07/2016 2300   GFRAA >60 07/07/2016 2300    BNP    Component Value Date/Time   BNP 39.8 07/08/2016 0055     Imaging:  DG Chest 2 View Result Date: 05/28/2024 CLINICAL DATA:  Dyspnea on exertion. EXAM: CHEST - 2 VIEW COMPARISON:  Chest radiograph dated 07/07/2016. FINDINGS: Background of emphysema and chronic interstitial coarsening. No focal consolidation, pleural effusion, or pneumothorax. The cardiac silhouette is within normal  limits. No acute osseous pathology. IMPRESSION: 1. No active cardiopulmonary disease. 2. Emphysema. Electronically Signed   By: Vanetta Chou M.D.   On: 05/28/2024 15:23    Administration History     None           No data to display          No results found for: NITRICOXIDE      Assessment & Plan:   Hypervolemia Significant BLE edema and volume overload. Suspect contributing to DOE and HTN. Check BNP and BMET today. Consider diuretic therapy and echocardiogram ending results. Advised to monitor BP at home. ED precautions reviewed. No increased O2 requirements. Monitor weights at home.   Patient Instructions  Continue Breztri  2 puffs twice a day.  Rinse and gargle after using. Continue albuterol  available to use 2 puffs or 1 nebulizer treatment up to every 4 hours if needed for shortness of breath, chest tightness, wheezing.  Continue prednisone  20 mg once daily. Continue azithromycin  250 mg once daily Continue levocetirizine 1 tab daily Continue supplemental oxygen  for goal>88-90%  Monitor blood pressure 1-2 times a day and keep a log. Goal <140/90. Notify your primary care provider if it's staying above this   Labs and chest x ray today We will likely treat you with furosemide  (lasix ) for fluid overload but will wait on your labs. We will call you with further instructions  Follow up in 2-3 weeks with Dr. Shelah or Izetta Malachy PIETY. If symptoms do not improve or worsen, please contact office for sooner follow up or seek emergency care.    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 No recent exacerbations. Slight improvement with addition of Ohtuvayre . Continue aggressive maintenance regimen. Action plan in place. Graded exercises encouraged  Lung nodule Considered benign on prior follow up. Will discuss enrollment in lung cancer screening at follow up. He would be a high risk for any invasive procedures.   Chronic  respiratory failure with hypoxia (HCC) Stable without increased O2 requirement. Goal >88-90%   Advised if symptoms do not improve or worsen, to please contact office for sooner follow up or seek emergency care.   I spent 35 minutes of dedicated to the care of this patient on the date of this encounter to include pre-visit review of records, face-to-face time with  the patient discussing conditions above, post visit ordering of testing, clinical documentation with the electronic health record, making appropriate referrals as documented, and communicating necessary findings to members of the patients care team.  Comer LULLA Rouleau, NP 05/28/2024  Pt aware and understands NP's role.

## 2024-05-28 NOTE — Assessment & Plan Note (Signed)
 Significant BLE edema and volume overload. Suspect contributing to DOE and HTN. Check BNP and BMET today. Consider diuretic therapy and echocardiogram ending results. Advised to monitor BP at home. ED precautions reviewed. No increased O2 requirements. Monitor weights at home.   Patient Instructions  Continue Breztri  2 puffs twice a day.  Rinse and gargle after using. Continue albuterol  available to use 2 puffs or 1 nebulizer treatment up to every 4 hours if needed for shortness of breath, chest tightness, wheezing.  Continue prednisone  20 mg once daily. Continue azithromycin  250 mg once daily Continue levocetirizine 1 tab daily Continue supplemental oxygen  for goal>88-90%  Monitor blood pressure 1-2 times a day and keep a log. Goal <140/90. Notify your primary care provider if it's staying above this   Labs and chest x ray today We will likely treat you with furosemide  (lasix ) for fluid overload but will wait on your labs. We will call you with further instructions  Follow up in 2-3 weeks with Dr. Shelah or Izetta Malachy PIETY. If symptoms do not improve or worsen, please contact office for sooner follow up or seek emergency care.

## 2024-05-28 NOTE — Patient Instructions (Addendum)
 Continue Breztri  2 puffs twice a day.  Rinse and gargle after using. Continue albuterol  available to use 2 puffs or 1 nebulizer treatment up to every 4 hours if needed for shortness of breath, chest tightness, wheezing.  Continue prednisone  20 mg once daily. Continue azithromycin  250 mg once daily Continue levocetirizine 1 tab daily Continue supplemental oxygen  for goal>88-90%  Monitor blood pressure 1-2 times a day and keep a log. Goal <140/90. Notify your primary care provider if it's staying above this   Labs and chest x ray today We will likely treat you with furosemide  (lasix ) for fluid overload but will wait on your labs. We will call you with further instructions  Follow up in 2-3 weeks with Dr. Shelah or Izetta Malachy PIETY. If symptoms do not improve or worsen, please contact office for sooner follow up or seek emergency care.

## 2024-05-28 NOTE — Assessment & Plan Note (Signed)
 Stable without increased O2 requirement. Goal >88-90%

## 2024-05-29 ENCOUNTER — Ambulatory Visit: Payer: Self-pay | Admitting: Nurse Practitioner

## 2024-05-29 NOTE — Progress Notes (Signed)
 BNP and BMET normal. No evidence that there is significant stress on the heart. CXR did not show any evidence of fluid overload. Recommend he contact his PCP regarding the swelling in the legs. They can determine if a trial of diuretic therapy would be indicated. Thanks.

## 2024-06-25 DIAGNOSIS — J449 Chronic obstructive pulmonary disease, unspecified: Secondary | ICD-10-CM | POA: Diagnosis not present

## 2024-06-26 ENCOUNTER — Other Ambulatory Visit: Payer: Self-pay | Admitting: Internal Medicine

## 2024-06-26 DIAGNOSIS — J449 Chronic obstructive pulmonary disease, unspecified: Secondary | ICD-10-CM

## 2024-07-01 ENCOUNTER — Other Ambulatory Visit: Payer: Self-pay | Admitting: Internal Medicine

## 2024-07-01 DIAGNOSIS — J309 Allergic rhinitis, unspecified: Secondary | ICD-10-CM

## 2024-07-02 ENCOUNTER — Ambulatory Visit: Payer: Medicare HMO

## 2024-07-02 VITALS — Ht 69.0 in | Wt 130.0 lb

## 2024-07-02 DIAGNOSIS — Z0001 Encounter for general adult medical examination with abnormal findings: Secondary | ICD-10-CM

## 2024-07-02 DIAGNOSIS — F17210 Nicotine dependence, cigarettes, uncomplicated: Secondary | ICD-10-CM | POA: Diagnosis not present

## 2024-07-02 DIAGNOSIS — Z0000 Encounter for general adult medical examination without abnormal findings: Secondary | ICD-10-CM

## 2024-07-02 DIAGNOSIS — Z87891 Personal history of nicotine dependence: Secondary | ICD-10-CM

## 2024-07-02 NOTE — Progress Notes (Signed)
 Subjective:   Jeffrey Krueger is a 68 y.o. who presents for a Medicare Wellness preventive visit.  As a reminder, Annual Wellness Visits don't include a physical exam, and some assessments may be limited, especially if this visit is performed virtually. We may recommend an in-person follow-up visit with your provider if needed.  Visit Complete: Virtual I connected with  Jeffrey Krueger on 07/02/24 by a audio enabled telemedicine application and verified that I am speaking with the correct person using two identifiers.  Patient Location: Home  Provider Location: Home Office  I discussed the limitations of evaluation and management by telemedicine. The patient expressed understanding and agreed to proceed.  Vital Signs: Because this visit was a virtual/telehealth visit, some criteria may be missing or patient reported. Any vitals not documented were not able to be obtained and vitals that have been documented are patient reported.  VideoDeclined- This patient declined Librarian, academic. Therefore the visit was completed with audio only.  Persons Participating in Visit: Patient.  AWV Questionnaire: No: Patient Medicare AWV questionnaire was not completed prior to this visit.  Cardiac Risk Factors include: advanced age (>70men, >8 women);male gender;sedentary lifestyle;Other (see comment), Risk factor comments: copd/ emphysema     Objective:    Today's Vitals   07/02/24 1111  Weight: 130 lb (59 kg)  Height: 5' 9 (1.753 m)   Body mass index is 19.2 kg/m.     07/02/2024   11:29 AM 11/30/2023    1:33 PM 11/27/2023    3:04 PM 11/05/2023    1:01 PM 07/02/2023   11:13 AM 06/15/2022   11:10 AM 07/07/2016   10:48 PM  Advanced Directives  Does Patient Have a Medical Advance Directive? No No No No No No No   Would patient like information on creating a medical advance directive? No - Patient declined No - Patient declined  No - Patient declined No - Patient  declined No - Patient declined No - patient declined information      Data saved with a previous flowsheet row definition    Current Medications (verified) Outpatient Encounter Medications as of 07/02/2024  Medication Sig   albuterol  (PROVENTIL ) (2.5 MG/3ML) 0.083% nebulizer solution INHALE 3 mls via NEBULIZER EVERY 4 HOURS AS NEEDED FOR WHEEZING OR SHORTNESS OF BREATH   azithromycin  (ZITHROMAX ) 250 MG tablet Take 1 tablet (250 mg total) by mouth daily.   BREZTRI  AEROSPHERE 160-9-4.8 MCG/ACT AERO inhaler INHALE 2 PUFFS INTO THE LUNGS IN THE MORNING AND AT BEDTIME.   buPROPion  (WELLBUTRIN  SR) 200 MG 12 hr tablet TAKE 1 TABLET BY MOUTH TWICE A DAY   levocetirizine (XYZAL ) 5 MG tablet TAKE 1 TABLET BY MOUTH DAILY IN THE EVENING   Misc. Devices (SUCTION GRAB BAR) MISC Use it as directed to prevent fall.   omeprazole  (PRILOSEC) 40 MG capsule TAKE 1 CAPSULE EVERY DAY   OXYGEN  Inhale into the lungs. 3 liters   predniSONE  (DELTASONE ) 20 MG tablet Take 1 tablet (20 mg total) by mouth daily with breakfast.   predniSONE  (STERAPRED UNI-PAK 21 TAB) 10 MG (21) TBPK tablet Take as package instructions.   Respiratory Therapy Supplies (NEBULIZER AIR TUBE/PLUGS) MISC For Albuterol  nebulization.   Respiratory Therapy Supplies (NEBULIZER MASK ADULT) MISC For Albuterol  nebulization.   traZODone  (DESYREL ) 100 MG tablet TAKE 1 TABLET BY MOUTH AT BEDTIME   UNABLE TO FIND 1 each by Does not apply route daily. Med Name: Raised Toilet Seat   UNABLE TO FIND 1 each  by Does not apply route daily. Med Name: International aid/development worker for Bathroom   VENTOLIN  HFA 108 (90 Base) MCG/ACT inhaler INHALE TWO PUFFS BY MOUTH EVERY 4 HOURS AS NEEDED   No facility-administered encounter medications on file as of 07/02/2024.    Allergies (verified) Patient has no known allergies.   History: Past Medical History:  Diagnosis Date   COPD (chronic obstructive pulmonary disease) (HCC)    Headache(784.0)    Shortness of breath    Past Surgical  History:  Procedure Laterality Date   BACK SURGERY     CATARACT EXTRACTION EXTRACAPSULAR Right 11/05/2023   Procedure: CATARACT EXTRACTION EXTRACAPSULAR WITH INTRAOCULAR LENS PLACEMENT (IOC);  Surgeon: Harrie Agent, MD;  Location: [facility];  Service: Ophthalmology;  Laterality: Right;  CDE: 22.95   CATARACT EXTRACTION W/PHACO Left 11/30/2023   Procedure: PHACOEMULSIFICATION, CATARACT, WITH IOL INSERTION.;  Surgeon: Harrie Agent, MD;  Location: [facility];  Service: Ophthalmology;  Laterality: Left;  CDE: 13.65   History reviewed. No pertinent family history. Social History   Socioeconomic History   Marital status: Married    Spouse name: Not on file   Number of children: Not on file   Years of education: Not on file   Highest education level: Not on file  Occupational History   Not on file  Tobacco Use   Smoking status: Former    Current packs/day: 0.00    Average packs/day: 0.5 packs/day for 50.2 years (25.1 ttl pk-yrs)    Types: Cigarettes    Start date: 02/06/1974    Quit date: 04/2024    Years since quitting: 0.1   Smokeless tobacco: Never   Tobacco comments:    07/02/2024 smoking history updated. Stopped smoking 2 months ago.   Substance and Sexual Activity   Alcohol use: No    Alcohol/week: 0.0 standard drinks of alcohol   Drug use: No   Sexual activity: Not on file  Other Topics Concern   Not on file  Social History Narrative   Not on file   Social Drivers of Health   Financial Resource Strain: Low Risk  (07/02/2024)   Overall Financial Resource Strain (CARDIA)    Difficulty of Paying Living Expenses: Not hard at all  Food Insecurity: No Food Insecurity (07/02/2024)   Hunger Vital Sign    Worried About Running Out of Food in the Last Year: Never true    Ran Out of Food in the Last Year: Never true  Transportation Needs: No Transportation Needs (07/02/2024)   PRAPARE - Administrator, Civil Service (Medical): No    Lack of Transportation (Non-Medical): No   Physical Activity: Inactive (07/02/2024)   Exercise Vital Sign    Days of Exercise per Week: 0 days    Minutes of Exercise per Session: 0 min  Stress: No Stress Concern Present (07/02/2024)   [name] of Occupational Health - Occupational Stress Questionnaire    Feeling of Stress: Only a little  Social Connections: Socially Isolated (07/02/2024)   Social Connection and Isolation Panel    Frequency of Communication with Friends and Family: Never    Frequency of Social Gatherings with Friends and Family: Never    Attends Religious Services: Never    Database administrator or Organizations: No    Attends Banker Meetings: Never    Marital Status: Separated    Tobacco Counseling Counseling given: Yes Tobacco comments: 07/02/2024 smoking history updated. Stopped smoking 2 months ago.     Clinical Intake:  Pre-visit preparation completed: Yes  Pain : No/denies pain     BMI - recorded: 19.2 Nutritional Status: BMI <19  Underweight Nutritional Risks: None Diabetes: No  Lab Results  Component Value Date   HGBA1C 5.4 04/19/2023   HGBA1C 5.2 04/17/2022     How often do you need to have someone help you when you read instructions, pamphlets, or other written materials from your doctor or pharmacy?: 1 - Never  Interpreter Needed?: No  Information entered by :: Carlson Belland W CMA (AAMA)   Activities of Daily Living     07/02/2024   11:29 AM 11/27/2023    3:06 PM  In your present state of health, do you have any difficulty performing the following activities:  Hearing? 0   Vision? 1   Comment cataract surgery in March 2025. Has difficulty with up close vision. follow up scheduled with surgeon in Jan 2026   Difficulty concentrating or making decisions? 0   Walking or climbing stairs? 1   Comment respiratory disease, oxygen  dependent   Dressing or bathing? 1   Doing errands, shopping? 1 0  Preparing Food and eating ? N   Using the Toilet? N   In the past six  months, have you accidently leaked urine? N   Do you have problems with loss of bowel control? N   Managing your Medications? N   Managing your Finances? N   Housekeeping or managing your Housekeeping? Y     Patient Care Team: Tobie Suzzane POUR, MD as PCP - General (Internal Medicine) Harrie Agent, MD as Consulting Physician (Ophthalmology) Malachy Comer GAILS, NP as Nurse Practitioner (Pulmonary Disease) Shelah Lamar RAMAN, MD as Consulting Physician (Pulmonary Disease) Eartha Flavors, Toribio, MD as Consulting Physician (Gastroenterology)  I have updated your Care Teams any recent Medical Services you may have received from other providers in the past year.     Assessment:   This is a routine wellness examination for [place].  Hearing/Vision screen Hearing Screening - Comments:: Patient denies any hearing difficulties.   Vision Screening - Comments:: Patient doesn't wear glasses but had cataract surgery in March/2025. Now stating he needs readers only. Has a follow up with Dr. Harrie w/ [facility] in January. Advised patient to buy a pair of reading glasses over the counter until he is seen for his follow up    Goals Addressed               This Visit's Progress     I want to feel better and be able to come off of this oxygen . (pt-stated)        Goal updated 07/02/2024.Stefano ORN , CMA          Depression Screen     07/02/2024   11:15 AM 01/17/2024   10:33 AM 07/19/2023   11:11 AM 07/02/2023   11:16 AM 04/19/2023    2:45 PM 10/18/2022    2:26 PM 06/15/2022   11:10 AM  PHQ 2/9 Scores  PHQ - 2 Score 1 0 3 3 0 2 1  PHQ- 9 Score 6 0 8 8  12       Fall Risk     07/02/2024   11:39 AM 01/17/2024   10:32 AM 07/19/2023   11:11 AM 07/02/2023   11:13 AM 04/19/2023    2:45 PM  Fall Risk   Falls in the past year? 0 1 0 0 0  Number falls in past yr: 0 0 0 0 0  Injury  with Fall? 0 0 0 0 0  Risk for fall due to : No Fall Risks No Fall Risks No Fall Risks Impaired  mobility;Impaired balance/gait;Impaired vision   Follow up Falls evaluation completed;Education provided;Falls prevention discussed Falls evaluation completed Falls evaluation completed Falls prevention discussed;Education provided     MEDICARE RISK AT HOME:  Medicare Risk at Home Any stairs in or around the home?: No If so, are there any without handrails?: No Home free of loose throw rugs in walkways, pet beds, electrical cords, etc?: Yes Adequate lighting in your home to reduce risk of falls?: Yes Life alert?: Yes Use of a cane, walker or w/c?: Yes Grab bars in the bathroom?: No Shower chair or bench in shower?: Yes Elevated toilet seat or a handicapped toilet?: Yes  TIMED UP AND GO:  Was the test performed?  No  Cognitive Function: 6CIT completed    06/15/2022   11:11 AM  MMSE - Mini Mental State Exam  Not completed: Unable to complete        07/02/2024   11:39 AM 07/02/2023   11:15 AM 06/15/2022   11:11 AM  6CIT Screen  What Year? 0 points 0 points 0 points  What month? 0 points 0 points 0 points  What time? 0 points 0 points 0 points  Count back from 20 0 points 0 points 0 points  Months in reverse 0 points 0 points 0 points  Repeat phrase 0 points 0 points 0 points  Total Score 0 points 0 points 0 points    Immunizations Immunization History  Administered Date(s) Administered   Fluad Quad(high Dose 65+) 10/18/2022   Fluad Trivalent(High Dose 65+) 07/19/2023   Influenza,inj,Quad PF,6+ Mos 08/30/2018, 11/02/2019, 10/27/2020   Influenza-Unspecified 06/26/2015, 07/27/2019   Pneumococcal Conjugate-13 12/08/2020   Pneumococcal Polysaccharide-23 09/05/2013, 04/17/2022    Screening Tests Health Maintenance  Topic Date Due   DTaP/Tdap/Td (1 - Tdap) Never done   Zoster Vaccines- Shingrix (1 of 2) Never done   Lung Cancer Screening  11/03/2023   Influenza Vaccine  04/25/2024   COVID-19 Vaccine (1 - 2024-25 season) Never done   Medicare Annual Wellness (AWV)   07/02/2025   Fecal DNA (Cologuard)  09/13/2026   Pneumococcal Vaccine: 50+ Years  Completed   Hepatitis C Screening  Completed   Meningococcal B Vaccine  Aged Out    Health Maintenance Health Maintenance Due  Topic Date Due   DTaP/Tdap/Td (1 - Tdap) Never done   Zoster Vaccines- Shingrix (1 of 2) Never done   Lung Cancer Screening  11/03/2023   Influenza Vaccine  04/25/2024   COVID-19 Vaccine (1 - 2024-25 season) Never done   Health Maintenance Items Addressed: Referral sent for Low Dose Chest CT (smoker/hx smoking) Discussed positive cologuard in 2024. Patient stated that he had went to see gastro, but ran out of oxygen  that day and had to leave. The GI office does not have supplemental oxygen  in office. He stated that the provider seemed to have an attitude. Refuses new referral even if to a different GI office.   Additional Screening:  Vision Screening: Recommended annual ophthalmology exams for early detection of glaucoma and other disorders of the eye. Would you like a referral to an eye doctor? No    Dental Screening: Recommended annual dental exams for proper oral hygiene  Community Resource Referral / Chronic Care Management: CRR required this visit?  No   CCM required this visit?  No   Plan:  Referral placed for lung  cancer screening. Appointment scheduled for July 08, 2024 at 2:00pm for follow up. Per Dr. Tobie, it was ok to unblock SD/HFU to schedule f/u for patient due to bilateral lower extremity swelling and pain. Patient made aware of appointment date and time.   I have personally reviewed and noted the following in the patient's chart:   Medical and social history Use of alcohol, tobacco or illicit drugs  Current medications and supplements including opioid prescriptions. Patient is not currently taking opioid prescriptions. Functional ability and status Nutritional status Physical activity Advanced directives List of other physicians Hospitalizations,  surgeries, and ER visits in previous 12 months Vitals Screenings to include cognitive, depression, and falls Referrals and appointments  In addition, I have reviewed and discussed with patient certain preventive protocols, quality metrics, and best practice recommendations. A written personalized care plan for preventive services as well as general preventive health recommendations were provided to patient.   Kelliann Pendergraph, CMA   07/02/2024   After Visit Summary: (Mail) Due to this being a telephonic visit, the after visit summary with patients personalized plan was offered to patient via mail   Notes: See comment in RED until Health Maintenance Section

## 2024-07-02 NOTE — Patient Instructions (Signed)
 Mr. Shabazz,  Thank you for taking the time for your Medicare Wellness Visit. I appreciate your continued commitment to your health goals. Please review the care plan we discussed, and feel free to reach out if I can assist you further.  Medicare recommends these wellness visits once per year to help you and your care team stay ahead of potential health issues. These visits are designed to focus on prevention, allowing your provider to concentrate on managing your acute and chronic conditions during your regular appointments.  Please note that Annual Wellness Visits do not include a physical exam. Some assessments may be limited, especially if the visit was conducted virtually. If needed, we may recommend a separate in-person follow-up with your provider.  Ongoing Care  Seeing your primary care provider every 3 to 6 months helps us  monitor your health and provide consistent, personalized care.   Referrals   Lung Cancer Screening-[place] Office [address]-First Floor Medical Building directly across from [facility] Phone Number:[phone]   Recommended Screenings:  Health Maintenance  Topic Date Due   DTaP/Tdap/Td vaccine (1 - Tdap) Never done   Zoster (Shingles) Vaccine (1 of 2) Never done   Screening for Lung Cancer  11/03/2023   Flu Shot  04/25/2024   COVID-19 Vaccine (1 - 2024-25 season) Never done   Medicare Annual Wellness Visit  07/02/2025   Cologuard (Stool DNA test)  09/13/2026   Pneumococcal Vaccine for age over 38  Completed   Hepatitis C Screening  Completed   Meningitis B Vaccine  Aged Out       07/02/2024   11:29 AM  Advanced Directives  Does Patient Have a Medical Advance Directive? No  Would patient like information on creating a medical advance directive? No - Patient declined    Advance Care Planning is important because it: Ensures you receive medical care that aligns with your values, goals, and preferences. Provides guidance to your family and  loved ones, reducing the emotional burden of decision-making during critical moments.  Vision: Annual vision screenings are recommended for early detection of glaucoma, cataracts, and diabetic retinopathy. These exams can also reveal signs of chronic conditions such as diabetes and high blood pressure.  Dental: Annual dental screenings help detect early signs of oral cancer, gum disease, and other conditions linked to overall health, including heart disease and diabetes.  Please see the attached documents for additional preventive care recommendations.

## 2024-07-08 ENCOUNTER — Ambulatory Visit (INDEPENDENT_AMBULATORY_CARE_PROVIDER_SITE_OTHER): Admitting: Internal Medicine

## 2024-07-08 ENCOUNTER — Encounter: Payer: Self-pay | Admitting: Internal Medicine

## 2024-07-08 VITALS — BP 136/62 | HR 76 | Ht 69.0 in | Wt 153.0 lb

## 2024-07-08 DIAGNOSIS — M7989 Other specified soft tissue disorders: Secondary | ICD-10-CM | POA: Diagnosis not present

## 2024-07-08 DIAGNOSIS — I872 Venous insufficiency (chronic) (peripheral): Secondary | ICD-10-CM

## 2024-07-08 DIAGNOSIS — Z23 Encounter for immunization: Secondary | ICD-10-CM

## 2024-07-08 DIAGNOSIS — L02424 Furuncle of left upper limb: Secondary | ICD-10-CM

## 2024-07-08 DIAGNOSIS — J449 Chronic obstructive pulmonary disease, unspecified: Secondary | ICD-10-CM | POA: Diagnosis not present

## 2024-07-08 DIAGNOSIS — F3341 Major depressive disorder, recurrent, in partial remission: Secondary | ICD-10-CM

## 2024-07-08 DIAGNOSIS — J9611 Chronic respiratory failure with hypoxia: Secondary | ICD-10-CM | POA: Diagnosis not present

## 2024-07-08 DIAGNOSIS — Z0001 Encounter for general adult medical examination with abnormal findings: Secondary | ICD-10-CM

## 2024-07-08 DIAGNOSIS — E782 Mixed hyperlipidemia: Secondary | ICD-10-CM

## 2024-07-08 DIAGNOSIS — I5032 Chronic diastolic (congestive) heart failure: Secondary | ICD-10-CM

## 2024-07-08 DIAGNOSIS — R739 Hyperglycemia, unspecified: Secondary | ICD-10-CM

## 2024-07-08 MED ORDER — CEPHALEXIN 500 MG PO CAPS: 500.0000 mg | ORAL_CAPSULE | Freq: Three times a day (TID) | ORAL | 0 refills | Status: DC

## 2024-07-08 MED ORDER — FUROSEMIDE 20 MG PO TABS: 20.0000 mg | ORAL_TABLET | Freq: Every day | ORAL | 2 refills | Status: DC

## 2024-07-08 MED ORDER — TRIAMCINOLONE ACETONIDE 0.1 % EX CREA
1.0000 | TOPICAL_CREAM | Freq: Two times a day (BID) | CUTANEOUS | 1 refills | Status: AC
Start: 2024-07-08 — End: ?

## 2024-07-08 NOTE — Assessment & Plan Note (Addendum)
 Has been on 2-3 l O2 at home since hospitalization for COPD exacerbation in 08/2020 Underlying COPD and continuous tobacco abuse Albuterol  nebulizer PRN, supplies sent

## 2024-07-08 NOTE — Progress Notes (Signed)
 Established Patient Office Visit  Subjective:  Patient ID: Jeffrey Krueger, male    DOB: 11-07-1955  Age: 68 y.o. MRN: [id number]  CC:  Chief Complaint  Patient presents with   Leg Swelling    Swelling in both feet and legs, swelling in arms    HPI Jeffrey Krueger is a 69 y.o. male with past medical history of COPD, hypoxic respiratory failure, anxiety and tobacco abuse who presents for annual physical.  He complains of bilateral leg swelling for the last few months.  Denies any recent worsening of dyspnea, but has chronic hypoxic respiratory failure, he is oxygen  dependent at baseline due to COPD.  He admits that he needs to cut down salt intake.  He mostly spends the whole day while sitting in a chair/wheelchair, but has noticed improvement in leg swelling after lying down.  He usually sleeps on the floor as it helps him with breathing according to him.  He has been using Breztri  and as needed albuterol  for COPD.  He follows up with Pulmonology for COPD and chronic hypoxic respiratory failure. He has continuous home O2 for it now.  He still smokes about 0.5 pack/day.  He is on oral prednisone  and azithromycin  for chronic hypoxia.  He currently takes Wellbutrin  200 mg BID for GAD and trazodone  100 mg nightly for insomnia. He still has insomnia, but has drowsiness with higher doses of Trazodone .  He denies any anhedonia.  He has anxiety at nighttime when he has to use O2 while sleeping. He denies any SI or HI currently.  He reports having a bump on left forearm area since yesterday.  Does not recall any specific injury.  He has soreness over the site.  He noticed puslike discharge this morning, and has noticed reduction in size of the bump since then.  He denies any fever or chills.     Past Medical History:  Diagnosis Date   COPD (chronic obstructive pulmonary disease) (HCC)    Headache(784.0)    Shortness of breath     Past Surgical History:  Procedure Laterality Date   BACK  SURGERY     CATARACT EXTRACTION EXTRACAPSULAR Right 11/05/2023   Procedure: CATARACT EXTRACTION EXTRACAPSULAR WITH INTRAOCULAR LENS PLACEMENT (IOC);  Surgeon: Harrie Agent, MD;  Location: [facility];  Service: Ophthalmology;  Laterality: Right;  CDE: 22.95   CATARACT EXTRACTION W/PHACO Left 11/30/2023   Procedure: PHACOEMULSIFICATION, CATARACT, WITH IOL INSERTION.;  Surgeon: Harrie Agent, MD;  Location: [facility];  Service: Ophthalmology;  Laterality: Left;  CDE: 13.65    History reviewed. No pertinent family history.  Social History   Socioeconomic History   Marital status: Married    Spouse name: Not on file   Number of children: Not on file   Years of education: Not on file   Highest education level: Not on file  Occupational History   Not on file  Tobacco Use   Smoking status: Former    Current packs/day: 0.00    Average packs/day: 0.5 packs/day for 50.2 years (25.1 ttl pk-yrs)    Types: Cigarettes    Start date: 02/06/1974    Quit date: 04/2024    Years since quitting: 0.2   Smokeless tobacco: Never   Tobacco comments:    07/02/2024 smoking history updated. Stopped smoking 2 months ago.   Substance and Sexual Activity   Alcohol use: No    Alcohol/week: 0.0 standard drinks of alcohol   Drug use: No   Sexual activity: Not on file  Other Topics Concern   Not on file  Social History Narrative   Not on file   Social Drivers of Health   Financial Resource Strain: Low Risk  (07/02/2024)   Overall Financial Resource Strain (CARDIA)    Difficulty of Paying Living Expenses: Not hard at all  Food Insecurity: No Food Insecurity (07/02/2024)   Hunger Vital Sign    Worried About Running Out of Food in the Last Year: Never true    Ran Out of Food in the Last Year: Never true  Transportation Needs: No Transportation Needs (07/02/2024)   PRAPARE - Administrator, Civil Service (Medical): No    Lack of Transportation (Non-Medical): No  Physical Activity: Inactive (07/02/2024)    Exercise Vital Sign    Days of Exercise per Week: 0 days    Minutes of Exercise per Session: 0 min  Stress: No Stress Concern Present (07/02/2024)   [name] of Occupational Health - Occupational Stress Questionnaire    Feeling of Stress: Only a little  Social Connections: Socially Isolated (07/02/2024)   Social Connection and Isolation Panel    Frequency of Communication with Friends and Family: Never    Frequency of Social Gatherings with Friends and Family: Never    Attends Religious Services: Never    Database administrator or Organizations: No    Attends Banker Meetings: Never    Marital Status: Separated  Intimate Partner Violence: Not At Risk (07/02/2024)   Humiliation, Afraid, Rape, and Kick questionnaire    Fear of Current or Ex-Partner: No    Emotionally Abused: No    Physically Abused: No    Sexually Abused: No    Outpatient Medications Prior to Visit  Medication Sig Dispense Refill   albuterol  (PROVENTIL ) (2.5 MG/3ML) 0.083% nebulizer solution INHALE 3 mls via NEBULIZER EVERY 4 HOURS AS NEEDED FOR WHEEZING OR SHORTNESS OF BREATH 450 mL 3   azithromycin  (ZITHROMAX ) 250 MG tablet Take 1 tablet (250 mg total) by mouth daily. 90 each 3   BREZTRI  AEROSPHERE 160-9-4.8 MCG/ACT AERO inhaler INHALE 2 PUFFS INTO THE LUNGS IN THE MORNING AND AT BEDTIME. 3 each 3   buPROPion  (WELLBUTRIN  SR) 200 MG 12 hr tablet TAKE 1 TABLET BY MOUTH TWICE A DAY 180 tablet 1   levocetirizine (XYZAL ) 5 MG tablet TAKE 1 TABLET BY MOUTH DAILY IN THE EVENING 90 tablet 1   Misc. Devices (SUCTION GRAB BAR) MISC Use it as directed to prevent fall. 1 each 0   omeprazole  (PRILOSEC) 40 MG capsule TAKE 1 CAPSULE EVERY DAY 90 capsule 3   OXYGEN  Inhale into the lungs. 3 liters     predniSONE  (DELTASONE ) 20 MG tablet Take 1 tablet (20 mg total) by mouth daily with breakfast. 90 tablet 3   Respiratory Therapy Supplies (NEBULIZER AIR TUBE/PLUGS) MISC For Albuterol  nebulization. 1 each 0    Respiratory Therapy Supplies (NEBULIZER MASK ADULT) MISC For Albuterol  nebulization. 1 each 0   traZODone  (DESYREL ) 100 MG tablet TAKE 1 TABLET BY MOUTH AT BEDTIME 90 tablet 1   UNABLE TO FIND 1 each by Does not apply route daily. Med Name: Raised Toilet Seat 1 each 0   UNABLE TO FIND 1 each by Does not apply route daily. Med Name: International aid/development worker for Bathroom 1 each 0   VENTOLIN  HFA 108 (90 Base) MCG/ACT inhaler INHALE TWO PUFFS BY MOUTH EVERY 4 HOURS AS NEEDED 18 g 5   predniSONE  (STERAPRED UNI-PAK 21 TAB) 10 MG (21) TBPK  tablet Take as package instructions. 1 each 0   No facility-administered medications prior to visit.    No Known Allergies  ROS Review of Systems  Constitutional:  Positive for fatigue. Negative for chills and fever.  HENT:  Negative for congestion and sore throat.   Eyes:  Negative for pain and discharge.  Respiratory:  Positive for cough, shortness of breath and wheezing.   Cardiovascular:  Positive for leg swelling. Negative for chest pain and palpitations.  Gastrointestinal:  Negative for diarrhea, nausea and vomiting.  Endocrine: Negative for polydipsia and polyuria.  Genitourinary:  Negative for dysuria and hematuria.  Musculoskeletal:  Positive for arthralgias, back pain and neck pain. Negative for neck stiffness.  Skin:  Negative for rash.  Neurological:  Negative for dizziness and weakness.  Psychiatric/Behavioral:  Positive for sleep disturbance. Negative for agitation and behavioral problems. The patient is nervous/anxious.       Objective:    Physical Exam Vitals reviewed.  Constitutional:      General: He is not in acute distress.    Appearance: He is not diaphoretic.     Comments: In wheelchair  HENT:     Head: Normocephalic and atraumatic.     Nose: Nose normal.     Mouth/Throat:     Mouth: Mucous membranes are moist.  Eyes:     General: No scleral icterus.    Extraocular Movements: Extraocular movements intact.  Cardiovascular:     Rate and  Rhythm: Normal rate and regular rhythm.     Heart sounds: Normal heart sounds. No murmur heard. Pulmonary:     Breath sounds: No wheezing or rales.     Comments: O2 - 3 lpm Abdominal:     Palpations: Abdomen is soft.     Tenderness: There is no abdominal tenderness.  Musculoskeletal:     Right wrist: Bony tenderness present. Decreased range of motion.     Left wrist: Bony tenderness present. Decreased range of motion.     Right hand: Tenderness present.     Left hand: Tenderness present.     Cervical back: Neck supple. No tenderness.     Right lower leg: Edema (2+) present.     Left lower leg: Edema (2+) present.  Skin:    General: Skin is warm.     Findings: Rash (Stasis dermatitis over bilateral legs) present.     Comments: Furuncle over dorsal left forearm, about 3 cm in diameter  Neurological:     General: No focal deficit present.     Mental Status: He is alert and oriented to person, place, and time.     Sensory: No sensory deficit.     Motor: Weakness (3/5 - b/l LE) present.  Psychiatric:        Mood and Affect: Mood normal.        Behavior: Behavior normal.     BP 136/62 (BP Location: Left Arm)   Pulse 76   Ht 5' 9 (1.753 m)   Wt 153 lb (69.4 kg)   SpO2 99%   BMI 22.59 kg/m  Wt Readings from Last 3 Encounters:  07/08/24 153 lb (69.4 kg)  07/02/24 130 lb (59 kg)  05/28/24 128 lb (58.1 kg)    Lab Results  Component Value Date   TSH 1.260 04/19/2023   Lab Results  Component Value Date   WBC 10.0 04/19/2023   HGB 13.6 04/19/2023   HCT 42.2 04/19/2023   MCV 94 04/19/2023   PLT 264 04/19/2023  Lab Results  Component Value Date   NA 140 05/28/2024   K 3.5 05/28/2024   CO2 36 (H) 05/28/2024   GLUCOSE 78 05/28/2024   BUN 18 05/28/2024   CREATININE 1.03 05/28/2024   BILITOT 0.8 04/19/2023   ALKPHOS 60 04/19/2023   AST 13 04/19/2023   ALT 12 04/19/2023   PROT 5.1 (L) 04/19/2023   ALBUMIN 3.7 (L) 04/19/2023   CALCIUM 8.3 (L) 05/28/2024   ANIONGAP  10 07/07/2016   EGFR 88 04/19/2023   GFR 74.75 05/28/2024   Lab Results  Component Value Date   CHOL 163 04/19/2023   Lab Results  Component Value Date   HDL 52 04/19/2023   Lab Results  Component Value Date   LDLCALC 99 04/19/2023   Lab Results  Component Value Date   TRIG 62 04/19/2023   Lab Results  Component Value Date   CHOLHDL 3.1 04/19/2023   Lab Results  Component Value Date   HGBA1C 5.4 04/19/2023      Assessment & Plan:   Problem List Items Addressed This Visit       Cardiovascular and Mediastinum   Chronic heart failure with preserved ejection fraction (HFpEF) (HCC)   Bilateral leg swelling, chronic dyspnea concerning for CHF Started Lasix  20 mg QD Referred to cardiology - would need echocardiogram, likely has pulmonary hypertension as well      Relevant Medications   furosemide  (LASIX ) 20 MG tablet   Other Relevant Orders   Ambulatory referral to Cardiology   B Nat Peptide     Respiratory   COPD (chronic obstructive pulmonary disease) (HCC)   Overall stable On Breztri  and as needed albuterol  Also uses albuterol  neb On oral steroids now Followed by Pulmonology      Chronic respiratory failure with hypoxia (HCC)   Has been on 2-3 l O2 at home since hospitalization for COPD exacerbation in 08/2020 Underlying COPD and continuous tobacco abuse Albuterol  nebulizer PRN, supplies sent        Musculoskeletal and Integument   Furuncle of left forearm   Started empiric Keflex Advised to apply warm compresses Since QD has started draining already, would avoid I&D      Relevant Medications   cephALEXin (KEFLEX) 500 MG capsule   Other Relevant Orders   CBC with Differential/Platelet   Stasis dermatitis   Has chronic leg swelling, likely contributing to stasis dermatitis Kenalog cream prescribed      Relevant Medications   triamcinolone cream (KENALOG) 0.1 %     Other   Recurrent major depressive disorder, in partial remission (Chronic)    Overall well controlled with Wellbutrin  200 mg BID, but still sometimes takes it QD On trazodone  100 mg nightly due to persistent insomnia      Relevant Orders   TSH   CMP14+EGFR   CBC with Differential/Platelet   Leg swelling   Likely multifactorial, due to chronic venous insufficiency, CHF and likely has pulmonary hypertension as well Advised to perform leg elevation and use compression socks as tolerated Lasix  20 mg QD for leg swelling Advised to cut down sodium intake      Relevant Medications   furosemide  (LASIX ) 20 MG tablet   Other Relevant Orders   Ambulatory referral to Cardiology   B Nat Peptide   Encounter for general adult medical examination with abnormal findings - Primary   Physical exam as documented. Fasting blood tests ordered today. Advised to get Shingrix and Tdap vaccines at local pharmacy.  Other Visit Diagnoses       Mixed hyperlipidemia       Relevant Medications   furosemide  (LASIX ) 20 MG tablet   Other Relevant Orders   Lipid panel     Hyperglycemia       Relevant Orders   Hemoglobin A1c   CMP14+EGFR     Encounter for immunization       Relevant Orders   Flu vaccine HIGH DOSE PF(Fluzone Trivalent) (Completed)          Meds ordered this encounter  Medications   furosemide  (LASIX ) 20 MG tablet    Sig: Take 1 tablet (20 mg total) by mouth daily.    Dispense:  30 tablet    Refill:  2   cephALEXin (KEFLEX) 500 MG capsule    Sig: Take 1 capsule (500 mg total) by mouth 3 (three) times daily.    Dispense:  15 capsule    Refill:  0   triamcinolone cream (KENALOG) 0.1 %    Sig: Apply 1 Application topically 2 (two) times daily.    Dispense:  45 g    Refill:  1    Follow-up: Return in about 4 months (around 11/08/2024) for Leg swelling and MDD.    Suzzane MARLA Blanch, MD

## 2024-07-08 NOTE — Assessment & Plan Note (Signed)
 Has chronic leg swelling, likely contributing to stasis dermatitis Kenalog cream prescribed

## 2024-07-08 NOTE — Assessment & Plan Note (Addendum)
 Started empiric Keflex Advised to apply warm compresses Since QD has started draining already, would avoid I&D

## 2024-07-08 NOTE — Assessment & Plan Note (Addendum)
 Physical exam as documented. Fasting blood tests ordered today. Advised to get Shingrix and Tdap vaccines at local pharmacy.

## 2024-07-08 NOTE — Assessment & Plan Note (Signed)
 Overall well controlled with Wellbutrin  200 mg BID, but still sometimes takes it QD On trazodone  100 mg nightly due to persistent insomnia

## 2024-07-08 NOTE — Assessment & Plan Note (Signed)
 Bilateral leg swelling, chronic dyspnea concerning for CHF Started Lasix  20 mg QD Referred to cardiology - would need echocardiogram, likely has pulmonary hypertension as well

## 2024-07-08 NOTE — Patient Instructions (Addendum)
 Please start taking Keflex as prescribed.  Please apply warm compresses over the skin abscess area.  Please take Lasix  once daily for 2 weeks and then as needed for leg swelling. Please perform leg elevation and use compression socks as tolerated.  Please apply Kenalog cream over leg rash.  Please continue to take medications as prescribed.  Please continue to follow low salt diet and ambulate as tolerated.

## 2024-07-08 NOTE — Assessment & Plan Note (Signed)
Overall stable On Breztri and as needed albuterol Also uses albuterol neb On oral steroids now Followed by Pulmonology

## 2024-07-08 NOTE — Assessment & Plan Note (Signed)
 Likely multifactorial, due to chronic venous insufficiency, CHF and likely has pulmonary hypertension as well Advised to perform leg elevation and use compression socks as tolerated Lasix  20 mg QD for leg swelling Advised to cut down sodium intake

## 2024-07-09 ENCOUNTER — Encounter (INDEPENDENT_AMBULATORY_CARE_PROVIDER_SITE_OTHER): Payer: Self-pay | Admitting: Gastroenterology

## 2024-07-25 ENCOUNTER — Encounter: Admitting: Nurse Practitioner

## 2024-07-25 DIAGNOSIS — R911 Solitary pulmonary nodule: Secondary | ICD-10-CM

## 2024-07-25 DIAGNOSIS — R0609 Other forms of dyspnea: Secondary | ICD-10-CM

## 2024-07-25 DIAGNOSIS — J449 Chronic obstructive pulmonary disease, unspecified: Secondary | ICD-10-CM

## 2024-08-04 ENCOUNTER — Encounter: Admitting: Internal Medicine

## 2024-08-04 NOTE — Progress Notes (Deleted)
 Jeffrey Krueger, male    DOB: 19-Mar-1956    MRN: [id number]   Brief patient profile:  ***  yo*** ***   [facility] severe copd pt  self-referred back to pulmonary clinic in [place]  08/04/2024  for ***      History of Present Illness  08/04/2024  Pulmonary/ 1st office eval/ [place] / [place] [facility]  No chief complaint on file.    Dyspnea:  *** Cough: *** Sleep: *** SABA use: *** 02 use:*** LDSCT:***  No obvious day to day or daytime pattern/variability or assoc excess/ purulent sputum or mucus plugs or hemoptysis or cp or chest tightness, subjective wheeze or overt sinus or hb symptoms.    Also denies any obvious fluctuation of symptoms with weather or environmental changes or other aggravating or alleviating factors except as outlined above   No unusual exposure hx or h/o childhood pna/ asthma or knowledge of premature birth.  Current Allergies, Complete Past Medical History, Past Surgical History, Family History, and Social History were reviewed in [employer] record.  ROS  The following are not active complaints unless bolded Hoarseness, sore throat, dysphagia, dental problems, itching, sneezing,  nasal congestion or discharge of excess mucus or purulent secretions, ear ache,   fever, chills, sweats, unintended wt loss or wt gain, classically pleuritic or exertional cp,  orthopnea pnd or arm/hand swelling  or leg swelling, presyncope, palpitations, abdominal pain, anorexia, nausea, vomiting, diarrhea  or change in bowel habits or change in bladder habits, change in stools or change in urine, dysuria, hematuria,  rash, arthralgias, visual complaints, headache, numbness, weakness or ataxia or problems with walking or coordination,  change in mood or  memory.            Outpatient Medications Prior to Visit  Medication Sig Dispense Refill   albuterol  (PROVENTIL ) (2.5 MG/3ML) 0.083% nebulizer solution INHALE 3 mls via NEBULIZER EVERY 4 HOURS AS NEEDED FOR  WHEEZING OR SHORTNESS OF BREATH 450 mL 3   azithromycin  (ZITHROMAX ) 250 MG tablet Take 1 tablet (250 mg total) by mouth daily. 90 each 3   BREZTRI  AEROSPHERE 160-9-4.8 MCG/ACT AERO inhaler INHALE 2 PUFFS INTO THE LUNGS IN THE MORNING AND AT BEDTIME. 3 each 3   buPROPion  (WELLBUTRIN  SR) 200 MG 12 hr tablet TAKE 1 TABLET BY MOUTH TWICE A DAY 180 tablet 1   cephALEXin (KEFLEX) 500 MG capsule Take 1 capsule (500 mg total) by mouth 3 (three) times daily. 15 capsule 0   furosemide  (LASIX ) 20 MG tablet Take 1 tablet (20 mg total) by mouth daily. 30 tablet 2   levocetirizine (XYZAL ) 5 MG tablet TAKE 1 TABLET BY MOUTH DAILY IN THE EVENING 90 tablet 1   Misc. Devices (SUCTION GRAB BAR) MISC Use it as directed to prevent fall. 1 each 0   omeprazole  (PRILOSEC) 40 MG capsule TAKE 1 CAPSULE EVERY DAY 90 capsule 3   OXYGEN  Inhale into the lungs. 3 liters     predniSONE  (DELTASONE ) 20 MG tablet Take 1 tablet (20 mg total) by mouth daily with breakfast. 90 tablet 3   Respiratory Therapy Supplies (NEBULIZER AIR TUBE/PLUGS) MISC For Albuterol  nebulization. 1 each 0   Respiratory Therapy Supplies (NEBULIZER MASK ADULT) MISC For Albuterol  nebulization. 1 each 0   traZODone  (DESYREL ) 100 MG tablet TAKE 1 TABLET BY MOUTH AT BEDTIME 90 tablet 1   triamcinolone cream (KENALOG) 0.1 % Apply 1 Application topically 2 (two) times daily. 45 g 1   UNABLE TO FIND 1  each by Does not apply route daily. Med Name: Raised Toilet Seat 1 each 0   UNABLE TO FIND 1 each by Does not apply route daily. Med Name: International Aid/development Worker for Bathroom 1 each 0   VENTOLIN  HFA 108 (90 Base) MCG/ACT inhaler INHALE TWO PUFFS BY MOUTH EVERY 4 HOURS AS NEEDED 18 g 5   No facility-administered medications prior to visit.    Past Medical History:  Diagnosis Date   COPD (chronic obstructive pulmonary disease) (HCC)    Headache(784.0)    Shortness of breath       Objective:     There were no vitals taken for this visit.         Assessment

## 2024-08-20 ENCOUNTER — Telehealth: Payer: Self-pay | Admitting: Internal Medicine

## 2024-08-20 ENCOUNTER — Other Ambulatory Visit: Payer: Self-pay | Admitting: Internal Medicine

## 2024-08-20 DIAGNOSIS — L299 Pruritus, unspecified: Secondary | ICD-10-CM

## 2024-08-20 MED ORDER — HYDROXYZINE HCL 10 MG PO TABS: 10.0000 mg | ORAL_TABLET | Freq: Two times a day (BID) | ORAL | 0 refills | Status: DC | PRN

## 2024-08-20 NOTE — Telephone Encounter (Unsigned)
 Copied from CRM #[id number]. Topic: Clinical - Medication Question >> Aug 20, 2024  1:13 PM [name] wrote: Reason for CRM: Patient called asking for provider to send a medication for itching for the upper part of the body- arm, hands/chest and back  [name] - [name], [place] - [address]   Refused appointment - please call the patient back at [phone]

## 2024-08-20 NOTE — Telephone Encounter (Signed)
Pt informed

## 2024-08-29 NOTE — Progress Notes (Signed)
 This encounter was created in error - please disregard.

## 2024-09-02 ENCOUNTER — Encounter: Admitting: Internal Medicine

## 2024-09-02 DIAGNOSIS — R0609 Other forms of dyspnea: Secondary | ICD-10-CM

## 2024-09-02 DIAGNOSIS — J449 Chronic obstructive pulmonary disease, unspecified: Secondary | ICD-10-CM

## 2024-09-02 DIAGNOSIS — R911 Solitary pulmonary nodule: Secondary | ICD-10-CM

## 2024-09-02 NOTE — Progress Notes (Deleted)
 Jeffrey Krueger, male    DOB: July 16, 1956    MRN: [id number]   Brief patient profile:  68  yo*** ***   [facility] pt severe copd/02 dep  pt  self-referred  to pulmonary clinic in [place]  09/02/2024  to establish care at the [facility] office.     History of Present Illness  09/02/2024  Pulmonary/ 1st office eval/ Jeffrey Krueger / [place] [facility]  No chief complaint on file.    Dyspnea:  *** Cough: *** Sleep: *** SABA use: *** 02 use:*** LDSCT:***  No obvious day to day or daytime pattern/variability or assoc excess/ purulent sputum or mucus plugs or hemoptysis or cp or chest tightness, subjective wheeze or overt sinus or hb symptoms.    Also denies any obvious fluctuation of symptoms with weather or environmental changes or other aggravating or alleviating factors except as outlined above   No unusual exposure hx or h/o childhood pna/ asthma or knowledge of premature birth.  Current Allergies, Complete Past Medical History, Past Surgical History, Family History, and Social History were reviewed in [employer] record.  ROS  The following are not active complaints unless bolded Hoarseness, sore throat, dysphagia, dental problems, itching, sneezing,  nasal congestion or discharge of excess mucus or purulent secretions, ear ache,   fever, chills, sweats, unintended wt loss or wt gain, classically pleuritic or exertional cp,  orthopnea pnd or arm/hand swelling  or leg swelling, presyncope, palpitations, abdominal pain, anorexia, nausea, vomiting, diarrhea  or change in bowel habits or change in bladder habits, change in stools or change in urine, dysuria, hematuria,  rash, arthralgias, visual complaints, headache, numbness, weakness or ataxia or problems with walking or coordination,  change in mood or  memory.            Outpatient Medications Prior to Visit  Medication Sig Dispense Refill   albuterol  (PROVENTIL ) (2.5 MG/3ML) 0.083% nebulizer solution INHALE 3 mls via NEBULIZER  EVERY 4 HOURS AS NEEDED FOR WHEEZING OR SHORTNESS OF BREATH 450 mL 3   azithromycin  (ZITHROMAX ) 250 MG tablet Take 1 tablet (250 mg total) by mouth daily. 90 each 3   BREZTRI  AEROSPHERE 160-9-4.8 MCG/ACT AERO inhaler INHALE 2 PUFFS INTO THE LUNGS IN THE MORNING AND AT BEDTIME. 3 each 3   buPROPion  (WELLBUTRIN  SR) 200 MG 12 hr tablet TAKE 1 TABLET BY MOUTH TWICE A DAY 180 tablet 1   cephALEXin  (KEFLEX ) 500 MG capsule Take 1 capsule (500 mg total) by mouth 3 (three) times daily. 15 capsule 0   furosemide  (LASIX ) 20 MG tablet Take 1 tablet (20 mg total) by mouth daily. 30 tablet 2   hydrOXYzine  (ATARAX ) 10 MG tablet Take 1 tablet (10 mg total) by mouth 2 (two) times daily as needed for itching. 30 tablet 0   levocetirizine (XYZAL ) 5 MG tablet TAKE 1 TABLET BY MOUTH DAILY IN THE EVENING 90 tablet 1   Misc. Devices (SUCTION GRAB BAR) MISC Use it as directed to prevent fall. 1 each 0   omeprazole  (PRILOSEC) 40 MG capsule TAKE 1 CAPSULE EVERY DAY 90 capsule 3   OXYGEN  Inhale into the lungs. 3 liters     predniSONE  (DELTASONE ) 20 MG tablet Take 1 tablet (20 mg total) by mouth daily with breakfast. 90 tablet 3   Respiratory Therapy Supplies (NEBULIZER AIR TUBE/PLUGS) MISC For Albuterol  nebulization. 1 each 0   Respiratory Therapy Supplies (NEBULIZER MASK ADULT) MISC For Albuterol  nebulization. 1 each 0   traZODone  (DESYREL ) 100 MG tablet TAKE  1 TABLET BY MOUTH AT BEDTIME 90 tablet 1   triamcinolone  cream (KENALOG ) 0.1 % Apply 1 Application topically 2 (two) times daily. 45 g 1   UNABLE TO FIND 1 each by Does not apply route daily. Med Name: Raised Toilet Seat 1 each 0   UNABLE TO FIND 1 each by Does not apply route daily. Med Name: International Aid/development Worker for Bathroom 1 each 0   VENTOLIN  HFA 108 (90 Base) MCG/ACT inhaler INHALE TWO PUFFS BY MOUTH EVERY 4 HOURS AS NEEDED 18 g 5   No facility-administered medications prior to visit.    Past Medical History:  Diagnosis Date   COPD (chronic obstructive pulmonary  disease) (HCC)    Headache(784.0)    Shortness of breath       Objective:     There were no vitals taken for this visit.         Assessment

## 2024-09-11 ENCOUNTER — Encounter: Payer: Self-pay | Admitting: Internal Medicine

## 2024-09-11 ENCOUNTER — Ambulatory Visit: Attending: Internal Medicine | Admitting: Internal Medicine

## 2024-09-11 VITALS — BP 156/94 | HR 93 | Ht 69.0 in | Wt 142.8 lb

## 2024-09-11 DIAGNOSIS — I50810 Right heart failure, unspecified: Secondary | ICD-10-CM

## 2024-09-11 DIAGNOSIS — M7989 Other specified soft tissue disorders: Secondary | ICD-10-CM | POA: Diagnosis not present

## 2024-09-11 DIAGNOSIS — I5032 Chronic diastolic (congestive) heart failure: Secondary | ICD-10-CM

## 2024-09-11 DIAGNOSIS — J439 Emphysema, unspecified: Secondary | ICD-10-CM

## 2024-09-11 DIAGNOSIS — I50813 Acute on chronic right heart failure: Secondary | ICD-10-CM

## 2024-09-11 DIAGNOSIS — I509 Heart failure, unspecified: Secondary | ICD-10-CM | POA: Insufficient documentation

## 2024-09-11 MED ORDER — FUROSEMIDE 20 MG PO TABS: 20.0000 mg | ORAL_TABLET | Freq: Every day | ORAL | 2 refills | Status: AC

## 2024-09-11 NOTE — Progress Notes (Signed)
 Cardiology Office Note  Date: 09/11/2024   ID: Wynton, Hufstetler Aug 25, 1956, MRN [id number]  PCP:  Tobie Suzzane POUR, MD  Cardiologist:  None Electrophysiologist:  None   History of Present Illness: MATEEN FRANSSEN is a 68 y.o. male known to have end-stage COPD was referred to cardiology clinic for evaluation of leg swelling.  He reported new onset of bilateral leg swelling since July/August 2025.  He has chronic DOE from COPD and does not remember if he had any worsening DOE as he was living with it.  During my interview, he was visibly tachypneic.  He has chest pains here and there lasting for a few seconds.  Not much active at all at home.  No dizziness, palpitations, syncope.  He has bilateral leg swelling that improved with p.o. Lasix  20 mg once daily and hence he stopped taking it 1 month ago.  He quit smoking 4 months ago.  Past Medical History:  Diagnosis Date   COPD (chronic obstructive pulmonary disease) (HCC)    Headache(784.0)    Shortness of breath     Past Surgical History:  Procedure Laterality Date   BACK SURGERY     CATARACT EXTRACTION EXTRACAPSULAR Right 11/05/2023   Procedure: CATARACT EXTRACTION EXTRACAPSULAR WITH INTRAOCULAR LENS PLACEMENT (IOC);  Surgeon: Harrie Agent, MD;  Location: [facility];  Service: Ophthalmology;  Laterality: Right;  CDE: 22.95   CATARACT EXTRACTION W/PHACO Left 11/30/2023   Procedure: PHACOEMULSIFICATION, CATARACT, WITH IOL INSERTION.;  Surgeon: Harrie Agent, MD;  Location: [facility];  Service: Ophthalmology;  Laterality: Left;  CDE: 13.65    Current Outpatient Medications  Medication Sig Dispense Refill   albuterol  (PROVENTIL ) (2.5 MG/3ML) 0.083% nebulizer solution INHALE 3 mls via NEBULIZER EVERY 4 HOURS AS NEEDED FOR WHEEZING OR SHORTNESS OF BREATH 450 mL 3   azithromycin  (ZITHROMAX ) 250 MG tablet Take 1 tablet (250 mg total) by mouth daily. 90 each 3   BREZTRI  AEROSPHERE 160-9-4.8 MCG/ACT AERO inhaler INHALE 2 PUFFS INTO THE LUNGS IN  THE MORNING AND AT BEDTIME. 3 each 3   buPROPion  (WELLBUTRIN  SR) 200 MG 12 hr tablet TAKE 1 TABLET BY MOUTH TWICE A DAY 180 tablet 1   cephALEXin  (KEFLEX ) 500 MG capsule Take 1 capsule (500 mg total) by mouth 3 (three) times daily. 15 capsule 0   hydrOXYzine  (ATARAX ) 10 MG tablet Take 1 tablet (10 mg total) by mouth 2 (two) times daily as needed for itching. 30 tablet 0   levocetirizine (XYZAL ) 5 MG tablet TAKE 1 TABLET BY MOUTH DAILY IN THE EVENING 90 tablet 1   Misc. Devices (SUCTION GRAB BAR) MISC Use it as directed to prevent fall. 1 each 0   OHTUVAYRE  3 MG/2.5ML SUSP Inhale into the lungs.     omeprazole  (PRILOSEC) 40 MG capsule TAKE 1 CAPSULE EVERY DAY 90 capsule 3   OXYGEN  Inhale into the lungs. 3 liters     predniSONE  (DELTASONE ) 20 MG tablet Take 1 tablet (20 mg total) by mouth daily with breakfast. 90 tablet 3   Respiratory Therapy Supplies (NEBULIZER AIR TUBE/PLUGS) MISC For Albuterol  nebulization. 1 each 0   Respiratory Therapy Supplies (NEBULIZER MASK ADULT) MISC For Albuterol  nebulization. 1 each 0   traZODone  (DESYREL ) 100 MG tablet TAKE 1 TABLET BY MOUTH AT BEDTIME 90 tablet 1   triamcinolone  cream (KENALOG ) 0.1 % Apply 1 Application topically 2 (two) times daily. 45 g 1   UNABLE TO FIND 1 each by Does not apply route daily. Med Name: Raised  Toilet Seat 1 each 0   UNABLE TO FIND 1 each by Does not apply route daily. Med Name: International Aid/development Worker for Bathroom 1 each 0   VENTOLIN  HFA 108 (90 Base) MCG/ACT inhaler INHALE TWO PUFFS BY MOUTH EVERY 4 HOURS AS NEEDED 18 g 5   furosemide  (LASIX ) 20 MG tablet Take 1 tablet (20 mg total) by mouth daily. (Patient not taking: Reported on 09/11/2024) 30 tablet 2   No current facility-administered medications for this visit.   Allergies:  Patient has no known allergies.   Social History: The patient  reports that he quit smoking about 4 months ago. His smoking use included cigarettes. He started smoking about 50 years ago. He has a 25.1 pack-year  smoking history. He has never used smokeless tobacco. He reports that he does not drink alcohol and does not use drugs.   Family History: The patient's family history is not on file.   ROS:  Please see the history of present illness. Otherwise, complete review of systems is positive for none  All other systems are reviewed and negative.   Physical Exam: VS:  BP (!) 156/94   Pulse 93   Ht 5' 9 (1.753 m)   Wt 142 lb 12.8 oz (64.8 kg)   SpO2 94%   BMI 21.09 kg/m , BMI Body mass index is 21.09 kg/m.  Wt Readings from Last 3 Encounters:  09/11/24 142 lb 12.8 oz (64.8 kg)  07/08/24 153 lb (69.4 kg)  07/02/24 130 lb (59 kg)    General: Patient appears comfortable at rest. HEENT: Conjunctiva and lids normal, oropharynx clear with moist mucosa. Neck: Supple, no elevated JVP or carotid bruits, no thyromegaly. Lungs: Clear to auscultation, nonlabored breathing at rest. Cardiac: Regular rate and rhythm, no S3 or significant systolic murmur, no pericardial rub. Abdomen: Soft, nontender, no hepatomegaly, bowel sounds present, no guarding or rebound. Extremities: No pitting edema, distal pulses 2+. Skin: Warm and dry. Musculoskeletal: No kyphosis. Neuropsychiatric: Alert and oriented x3, affect grossly appropriate.  Recent Labwork: 05/28/2024: BUN 18; Creatinine, Ser 1.03; Potassium 3.5; Pro B Natriuretic peptide (BNP) 55.0; Sodium 140     Component Value Date/Time   CHOL 163 04/19/2023 1532   TRIG 62 04/19/2023 1532   HDL 52 04/19/2023 1532   CHOLHDL 3.1 04/19/2023 1532   CHOLHDL 2.8 06/18/2013 0844   VLDL 18 06/18/2013 0844   LDLCALC 99 04/19/2023 1532    Other Studies Reviewed Today:   Assessment and Plan:  Right-sided heart failure End-stage COPD - New onset of bilateral lower EXTR swelling and abdominal distention x July/August 2025.  As leg swelling has been improving with p.o. Lasix  20 mg once daily, he thought he did not need it and hence stopped taking it 1 month ago.   Resume p.o. Lasix  20 mg once daily.  Chronic DOE and is not able to tell me if he noticed any worsening DOE.  But he is visibly tachypneic during my interview. - He was told he has end-stage COPD around 2 years ago. - Obtain echocardiogram.  proBNP normal and chest x-ray normal.  He probably has right-sided heart failure from end-stage COPD. - Update LFTs.    Chronic hypoxic respiratory failure End-stage COPD on home O2 - Patient was told he had end-stage COPD around 2 years ago.  Quit smoking 4 months ago.  Currently on home O2.  Not active at home.  Will update LFTs.  He missed his appointment with pulmonology recently.   45 minutes spent  in reviewing prior medical records, reports, more than 3 labs, discussion and documentation  Medication Adjustments/Labs and Tests Ordered: Current medicines are reviewed at length with the patient today.  Concerns regarding medicines are outlined above.    Disposition:  Follow up 6 weeks  Signed Minaal Struckman Priya Katleen Carraway, MD, 09/11/2024 3:40 PM    [facility] [address] [place], [place], [place] [address]

## 2024-09-11 NOTE — Patient Instructions (Addendum)
 Medication Instructions:  Your physician has recommended you make the following change in your medication:  Start taking Lasix  20 mg once daily  Continue taking all other medications as prescribed  Labwork: LFT to be completed at [facility]  Testing/Procedures: Your physician has requested that you have an echocardiogram. Echocardiography is a painless test that uses sound waves to create images of your heart. It provides your doctor with information about the size and shape of your heart and how well your hearts chambers and valves are working. This procedure takes approximately one hour. There are no restrictions for this procedure. Please do NOT wear cologne, perfume, aftershave, or lotions (deodorant is allowed). Please arrive 15 minutes prior to your appointment time.  Please note: We ask at that you not bring children with you during ultrasound (echo/ vascular) testing. Due to room size and safety concerns, children are not allowed in the ultrasound rooms during exams. Our front office staff cannot provide observation of children in our lobby area while testing is being conducted. An adult accompanying a patient to their appointment will only be allowed in the ultrasound room at the discretion of the ultrasound technician under special circumstances. We apologize for any inconvenience.   Follow-Up: Your physician recommends that you schedule a follow-up appointment in: 6 weeks  Any Other Special Instructions Will Be Listed Below (If Applicable). Thank you for choosing [facility]!     If you need a refill on your cardiac medications before your next appointment, please call your pharmacy.

## 2024-09-12 ENCOUNTER — Telehealth: Payer: Self-pay | Admitting: Internal Medicine

## 2024-09-12 NOTE — Telephone Encounter (Unsigned)
 Copied from CRM #[id number]. Topic: Clinical - Medication Refill >> Sep 12, 2024  3:43 PM Berwyn MATSU wrote: Medication: hydrOXYzine  (ATARAX ) 10 MG tablet  Is requesting an increase in dosage.   Has the patient contacted their pharmacy? Yes (Agent: If no, request that the patient contact the pharmacy for the refill. If patient does not wish to contact the pharmacy document the reason why and proceed with request.) (Agent: If yes, when and what did the pharmacy advise?)  This is the patient's preferred pharmacy:  [facility] - [name], [place] - [address] [place] [address] Phone: [phone] Fax: [phone]   Is this the correct pharmacy for this prescription? Yes If no, delete pharmacy and type the correct one.   Has the prescription been filled recently? Yes  Is the patient out of the medication? Yes  Has the patient been seen for an appointment in the last year OR does the patient have an upcoming appointment? Yes  Can we respond through MyChart? no  Agent: Please be advised that Rx refills may take up to 3 business days. We ask that you follow-up with your pharmacy.

## 2024-09-20 ENCOUNTER — Other Ambulatory Visit: Payer: Self-pay | Admitting: Internal Medicine

## 2024-09-20 DIAGNOSIS — L299 Pruritus, unspecified: Secondary | ICD-10-CM

## 2024-10-01 ENCOUNTER — Ambulatory Visit: Attending: Internal Medicine

## 2024-10-01 DIAGNOSIS — M7989 Other specified soft tissue disorders: Secondary | ICD-10-CM | POA: Diagnosis not present

## 2024-10-01 DIAGNOSIS — I50810 Right heart failure, unspecified: Secondary | ICD-10-CM

## 2024-10-02 ENCOUNTER — Ambulatory Visit: Payer: Self-pay | Admitting: Internal Medicine

## 2024-10-02 LAB — ECHOCARDIOGRAM COMPLETE
AR max vel: 2.62 cm2
AV Peak grad: 7.3 mmHg
Ao pk vel: 1.36 m/s
Area-P 1/2: 5.62 cm2
Calc EF: 50.7 %
Est EF: 45
S' Lateral: 3.6 cm
Single Plane A2C EF: 51.4 %
Single Plane A4C EF: 50.7 %

## 2024-10-09 ENCOUNTER — Encounter: Payer: Self-pay | Admitting: Internal Medicine

## 2024-10-09 ENCOUNTER — Ambulatory Visit: Admitting: Internal Medicine

## 2024-10-09 VITALS — BP 172/80 | HR 88 | Ht 69.0 in | Wt 151.0 lb

## 2024-10-09 DIAGNOSIS — J9612 Chronic respiratory failure with hypercapnia: Secondary | ICD-10-CM

## 2024-10-09 DIAGNOSIS — R0609 Other forms of dyspnea: Secondary | ICD-10-CM

## 2024-10-09 DIAGNOSIS — J9611 Chronic respiratory failure with hypoxia: Secondary | ICD-10-CM

## 2024-10-09 DIAGNOSIS — J449 Chronic obstructive pulmonary disease, unspecified: Secondary | ICD-10-CM

## 2024-10-09 DIAGNOSIS — Z87891 Personal history of nicotine dependence: Secondary | ICD-10-CM | POA: Diagnosis not present

## 2024-10-09 DIAGNOSIS — R911 Solitary pulmonary nodule: Secondary | ICD-10-CM

## 2024-10-09 MED ORDER — BREZTRI AEROSPHERE 160-9-4.8 MCG/ACT IN AERO: 2.0000 | INHALATION_SPRAY | Freq: Two times a day (BID) | RESPIRATORY_TRACT | Status: AC

## 2024-10-09 NOTE — Patient Instructions (Addendum)
 Plan A = Automatic = Always=    Breztri  Take 2 puffs first thing in am and then another 2 puffs about 12 hours later.    Work on inhaler technique:  relax and gently blow all the way out then take a nice smooth full deep breath back in, triggering the inhaler at same time you start breathing in.  Hold breath in for at least  5 seconds if you can. Blow out Breztri   thru nose. Rinse and gargle with water  when done.  If mouth or throat bother you at all,  try brushing teeth/gums/tongue with arm and hammer toothpaste/ make a slurry and gargle and spit out.   >>>  Remember how golfers warm up by taking practice swings - do this with an empty inhaler    Plan B = Backup (to supplement plan A, not to replace it) Use your albuterol  inhaler as a rescue medication to be used if you can't catch your breath by resting or slowing your pace  or doing a relaxed purse lip breathing pattern.  - The less you use it, the better it will work when you need it. - Ok to use the inhaler up to 2 puffs  every 4 hours if you must but call for appointment if use goes up over your usual need - Don't leave home without it !!  (think of it like the spare tire or starter fluid for your car)   Plan C = Crisis (instead of Plan B but only if Plan B stops working) - only use your albuterol  nebulizer if you first try Plan B and it fails to help > ok to use the nebulizer up to every 4 hours but if start needing it regularly call for immediate appointment   Make sure you check your oxygen  saturation  AT  your highest level of activity (not after you stop)   to be sure it stays over 90% and adjust  02 flow upward to maintain this level if needed but remember to turn it back to previous settings when you stop (to conserve your supply).   Please schedule a follow up office visit in 6 weeks, call sooner if needed - Bring Rollator and your respiratory  meds

## 2024-10-09 NOTE — Assessment & Plan Note (Addendum)
 HC03   06/09/24    = 36   - as of 10/09/2024 using 3lpm hs and encouraged to titrate 02 to sats > 90% but not over 95% -  see avs for instructions unique to this ov

## 2024-10-09 NOTE — Progress Notes (Addendum)
 "   Jeffrey Krueger, male    DOB: 04/16/1956    MRN: [id number]   Brief patient profile:  51  yowm former [place] pt quit smoking 2025  referred to pulmonary clinic in [place]  10/09/2024 by Dr Tobie  for copd eval  in w/c x 2020    Spirometry 12/06/20  FEV1 1.08  (32%)  Ratio 0.44 with severe concavity on f/v loop    History of Present Illness  10/09/2024  Pulmonary/ 1st office eval/ [facility] / breztri  but poor baseline technique  Chief Complaint  Patient presents with   Transitions Of Care    Lung nod - doe - coughing yellow-green  Copd   Dyspnea:  rollator but spends most days on scooter  Cough: variably congested in am x one hour > clear mucus  Sleep: flat bed with 2 pillows s noct resp cc  SABA use: Albuterol  neb/ avg 2-3 x per day same with saba, never prechallenges 02:  3lpm hs and up to 4lpm walking but still desats     No obvious day to day or daytime pattern/variability or assoc  purulent sputum or mucus plugs or hemoptysis or cp or chest tightness, subjective wheeze or overt sinus or hb symptoms.    Also denies any obvious fluctuation of symptoms with weather or environmental changes or other aggravating or alleviating factors except as outlined above   No unusual exposure hx or h/o childhood pna/ asthma or knowledge of premature birth.  Current Allergies, Complete Past Medical History, Past Surgical History, Family History, and Social History were reviewed in [employer] record.  ROS  The following are not active complaints unless bolded Hoarseness, sore throat, dysphagia, dental problems, itching, sneezing,  nasal congestion or discharge of excess mucus or purulent secretions, ear ache,   fever, chills, sweats, unintended wt loss or wt gain, classically pleuritic or exertional cp,  orthopnea pnd or arm/hand swelling  or leg swelling, presyncope, palpitations, abdominal pain, anorexia, nausea, vomiting, diarrhea  or change in bowel habits  or change in bladder habits, change in stools or change in urine, dysuria, hematuria,  rash, arthralgias, visual complaints, headache, numbness, weakness or ataxia or problems with walking or coordination,  change in mood or  memory.            Outpatient Medications Prior to Visit - - NOTE:   Unable to verify as accurately reflecting what pt takes    Medication Sig Dispense Refill   albuterol  (PROVENTIL ) (2.5 MG/3ML) 0.083% nebulizer solution INHALE 3 mls via NEBULIZER EVERY 4 HOURS AS NEEDED FOR WHEEZING OR SHORTNESS OF BREATH 450 mL 3   azithromycin  (ZITHROMAX ) 250 MG tablet Take 1 tablet (250 mg total) by mouth daily. 90 each 3   BREZTRI  AEROSPHERE 160-9-4.8 MCG/ACT AERO inhaler INHALE 2 PUFFS INTO THE LUNGS IN THE MORNING AND AT BEDTIME. 3 each 3   buPROPion  (WELLBUTRIN  SR) 200 MG 12 hr tablet TAKE 1 TABLET BY MOUTH TWICE A DAY 180 tablet 1   cephALEXin  (KEFLEX ) 500 MG capsule Take 1 capsule (500 mg total) by mouth 3 (three) times daily. 15 capsule 0   furosemide  (LASIX ) 20 MG tablet Take 1 tablet (20 mg total) by mouth daily. 30 tablet 2   hydrOXYzine  (ATARAX ) 10 MG tablet TAKE 1 TABLET BY MOUTH TWICE DAILY AS NEEDED FOR ITCHING 30 tablet 0   levocetirizine (XYZAL ) 5 MG tablet TAKE 1 TABLET BY MOUTH DAILY IN THE EVENING 90 tablet 1  Misc. Devices (SUCTION GRAB BAR) MISC Use it as directed to prevent fall. 1 each 0   OHTUVAYRE  3 MG/2.5ML SUSP Inhale into the lungs.     omeprazole  (PRILOSEC) 40 MG capsule TAKE 1 CAPSULE EVERY DAY 90 capsule 3   OXYGEN  Inhale into the lungs. 3 liters     predniSONE  (DELTASONE ) 20 MG tablet Take 1 tablet (20 mg total) by mouth daily with breakfast. 90 tablet 3   Respiratory Therapy Supplies (NEBULIZER AIR TUBE/PLUGS) MISC For Albuterol  nebulization. 1 each 0   Respiratory Therapy Supplies (NEBULIZER MASK ADULT) MISC For Albuterol  nebulization. 1 each 0   traZODone  (DESYREL ) 100 MG tablet TAKE 1 TABLET BY MOUTH AT BEDTIME 90 tablet 1   triamcinolone  cream  (KENALOG ) 0.1 % Apply 1 Application topically 2 (two) times daily. 45 g 1   UNABLE TO FIND 1 each by Does not apply route daily. Med Name: Raised Toilet Seat 1 each 0   UNABLE TO FIND 1 each by Does not apply route daily. Med Name: International Aid/development Worker for Bathroom 1 each 0   VENTOLIN  HFA 108 (90 Base) MCG/ACT inhaler INHALE TWO PUFFS BY MOUTH EVERY 4 HOURS AS NEEDED 18 g 5   No facility-administered medications prior to visit.    Past Medical History:  Diagnosis Date   COPD (chronic obstructive pulmonary disease) (HCC)    Headache(784.0)    Shortness of breath       Objective:     BP (!) 172/80   Pulse 88   Ht 5' 9 (1.753 m)   Wt 151 lb (68.5 kg)   SpO2 99% Comment: 4 l cont  BMI 22.30 kg/m   SpO2: 99 % (4 l cont)  somber wm w/c bound nad at rest   5cmod/ trace ankle edema edentulous   HEENT :  Oropharynx  clear/ edentulous    NECK :  without JVD/Nodes/TM/ nl carotid upstrokes bilaterally   LUNGS: no acc muscle use,  Mod barrel  contour chest wall with bilateral  Distant bs s audible wheeze and  without cough on insp or exp maneuvers and mod  Hyperresonant  to  percussion bilaterally     CV:  RRR  no s3 or murmur or increase in P2, and no edema   ABD:  soft and nontender with pos mid insp Hoover's  in the supine position. No bruits or organomegaly appreciated, bowel sounds nl  MS:   Ext warm without deformities or   obvious joint restrictions , calf tenderness, cyanosis or clubbing  SKIN: warm and dry without lesions    NEURO:  alert, approp, nl sensorium with  no motor or cerebellar deficits apparent.        I personally reviewed images and agree with radiology impression as follows:  CXR:   portable  03/04/24  C/w emphysema with mod hyperinflation       Assessment   Assessment & Plan Lung nodule  DOE (dyspnea on exertion)  Chronic respiratory failure with hypoxia and hypercapnia (HCC) HC03   06/09/24    = 36   - as of 10/09/2024 using 3lpm hs and encouraged to  titrate 02 to sats > 90% but not over 95% -  see avs for instructions unique to this ov    COPD mixed type (HCC) Quit smoking 2025 with GOLD 3 criteria in 2022 so likely now GOLD 4  - 02 dep x  ?2020 - Spirometry 12/06/20  FEV1 1.08  (32%)  Ratio 0.44 with severe concavity  on f/v loop - 10/09/2024  After extensive coaching inhaler device,  effectiveness =   60% from a baseline of 0 so rec >>>   continue breztri      Group E in terms of symptoms/risk so  laba/lama/ICS  therefore appropriate rx at this point  >>>  breztri  (which I don't believe he's had a fair trial of yet)   and more approp SABA prn as follows    Re SABA :  I spent extra time with pt today reviewing appropriate use of albuterol  for prn use on exertion with the following points: 1) saba is for relief of sob that does not improve by walking a slower pace or resting but rather if the pt does not improve after trying this first. 2) If the pt is convinced, as many are, that saba helps recover from activity faster then it's easy to tell if this is the case by re-challenging : ie stop, take the inhaler, then p 5 minutes try the exact same activity (intensity of workload) that just caused the symptoms and see if they are substantially diminished or not after saba 3) if there is an activity that reproducibly causes the symptoms, try the saba 15 min before the activity on alternate days   If in fact the saba really does help, then fine to continue to use it prn but advised may need to look closer at the maintenance regimen (breztri  vs Trelegy for example) being used to achieve better control of airways disease with exertion.         Each maintenance medication was reviewed in detail including emphasizing most importantly the difference between maintenance and prns and under what circumstances the prns are to be triggered using an action plan format where appropriate.  Total time for H and P, chart review, counseling, reviewing hfa/ neb/  02/ pulse ox  device(s) and generating customized AVS unique to this office visit / same day charting = 43 min with pt new to me  witih refractory chronic progressive respiratory  symptoms.          AVS  Patient Instructions  Plan A = Automatic = Always=    Breztri  Take 2 puffs first thing in am and then another 2 puffs about 12 hours later.    Work on inhaler technique:  relax and gently blow all the way out then take a nice smooth full deep breath back in, triggering the inhaler at same time you start breathing in.  Hold breath in for at least  5 seconds if you can. Blow out Breztri   thru nose. Rinse and gargle with water  when done.  If mouth or throat bother you at all,  try brushing teeth/gums/tongue with arm and hammer toothpaste/ make a slurry and gargle and spit out.   >>>  Remember how golfers warm up by taking practice swings - do this with an empty inhaler    Plan B = Backup (to supplement plan A, not to replace it) Use your albuterol  inhaler as a rescue medication to be used if you can't catch your breath by resting or slowing your pace  or doing a relaxed purse lip breathing pattern.  - The less you use it, the better it will work when you need it. - Ok to use the inhaler up to 2 puffs  every 4 hours if you must but call for appointment if use goes up over your usual need - Don't leave home without it !!  (think of  it like the spare tire or starter fluid for your car)   Plan C = Crisis (instead of Plan B but only if Plan B stops working) - only use your albuterol  nebulizer if you first try Plan B and it fails to help > ok to use the nebulizer up to every 4 hours but if start needing it regularly call for immediate appointment   Make sure you check your oxygen  saturation  AT  your highest level of activity (not after you stop)   to be sure it stays over 90% and adjust  02 flow upward to maintain this level if needed but remember to turn it back to previous settings when you stop  (to conserve your supply).   Please schedule a follow up office visit in 6 weeks, call sooner if needed - Bring Rollator and your respiratory  meds        Ozell America, MD 10/09/2024      "

## 2024-10-09 NOTE — Assessment & Plan Note (Addendum)
 Quit smoking 2025 with GOLD 3 criteria in 2022 so likely now GOLD 4  - 02 dep x  ?2020 - Spirometry 12/06/20  FEV1 1.08  (32%)  Ratio 0.44 with severe concavity on f/v loop - 10/09/2024  After extensive coaching inhaler device,  effectiveness =   60% from a baseline of 0 so rec >>>   continue breztri      Group E in terms of symptoms/risk so  laba/lama/ICS  therefore appropriate rx at this point  >>>  breztri  (which I don't believe he's had a fair trial of yet)   and more approp SABA prn as follows    Re SABA :  I spent extra time with pt today reviewing appropriate use of albuterol  for prn use on exertion with the following points: 1) saba is for relief of sob that does not improve by walking a slower pace or resting but rather if the pt does not improve after trying this first. 2) If the pt is convinced, as many are, that saba helps recover from activity faster then it's easy to tell if this is the case by re-challenging : ie stop, take the inhaler, then p 5 minutes try the exact same activity (intensity of workload) that just caused the symptoms and see if they are substantially diminished or not after saba 3) if there is an activity that reproducibly causes the symptoms, try the saba 15 min before the activity on alternate days   If in fact the saba really does help, then fine to continue to use it prn but advised may need to look closer at the maintenance regimen (breztri  vs Trelegy for example) being used to achieve better control of airways disease with exertion.         Each maintenance medication was reviewed in detail including emphasizing most importantly the difference between maintenance and prns and under what circumstances the prns are to be triggered using an action plan format where appropriate.  Total time for H and P, chart review, counseling, reviewing hfa/ neb/ 02/ pulse ox  device(s) and generating customized AVS unique to this office visit / same day charting = 43 min with  pt new to me  witih refractory chronic progressive respiratory  symptoms.

## 2024-10-27 ENCOUNTER — Other Ambulatory Visit: Payer: Self-pay | Admitting: Internal Medicine

## 2024-10-27 DIAGNOSIS — F3341 Major depressive disorder, recurrent, in partial remission: Secondary | ICD-10-CM

## 2024-11-20 ENCOUNTER — Ambulatory Visit: Admitting: Internal Medicine

## 2025-07-06 ENCOUNTER — Ambulatory Visit

## 2025-07-08 ENCOUNTER — Ambulatory Visit
